# Patient Record
Sex: Female | Born: 1950
Health system: Southern US, Community
[De-identification: ages and names within clinical notes are randomized; demographics above are authoritative.]

## PROBLEM LIST (undated history)

## (undated) DIAGNOSIS — I10 Essential (primary) hypertension: Secondary | ICD-10-CM

## (undated) DIAGNOSIS — M1990 Unspecified osteoarthritis, unspecified site: Secondary | ICD-10-CM

## (undated) DIAGNOSIS — E785 Hyperlipidemia, unspecified: Secondary | ICD-10-CM

## (undated) DIAGNOSIS — K219 Gastro-esophageal reflux disease without esophagitis: Secondary | ICD-10-CM

## (undated) HISTORY — PX: POLYPECTOMY: SHX149

## (undated) HISTORY — DX: Gastro-esophageal reflux disease without esophagitis: K21.9

## (undated) HISTORY — PX: NEUROPLASTY / TRANSPOSITION MEDIAN NERVE AT CARPAL TUNNEL BILATERAL: SUR894

## (undated) HISTORY — DX: Essential (primary) hypertension: I10

## (undated) HISTORY — DX: Hyperlipidemia, unspecified: E78.5

## (undated) HISTORY — DX: Unspecified osteoarthritis, unspecified site: M19.90

---

## 2000-10-16 ENCOUNTER — Emergency Department (HOSPITAL_COMMUNITY): Admission: EM | Admit: 2000-10-16 | Discharge: 2000-10-16 | Payer: Self-pay | Admitting: Emergency Medicine

## 2000-10-16 ENCOUNTER — Encounter: Payer: Self-pay | Admitting: Emergency Medicine

## 2001-02-23 ENCOUNTER — Other Ambulatory Visit: Admission: RE | Admit: 2001-02-23 | Discharge: 2001-02-23 | Payer: Self-pay | Admitting: *Deleted

## 2001-02-27 ENCOUNTER — Encounter: Admission: RE | Admit: 2001-02-27 | Discharge: 2001-02-27 | Payer: Self-pay | Admitting: Obstetrics and Gynecology

## 2001-02-27 ENCOUNTER — Encounter: Payer: Self-pay | Admitting: Obstetrics and Gynecology

## 2002-02-06 ENCOUNTER — Encounter: Payer: Self-pay | Admitting: Physical Medicine and Rehabilitation

## 2002-02-06 ENCOUNTER — Ambulatory Visit (HOSPITAL_COMMUNITY)
Admission: RE | Admit: 2002-02-06 | Discharge: 2002-02-06 | Payer: Self-pay | Admitting: Physical Medicine and Rehabilitation

## 2002-12-13 ENCOUNTER — Emergency Department (HOSPITAL_COMMUNITY): Admission: EM | Admit: 2002-12-13 | Discharge: 2002-12-13 | Payer: Self-pay | Admitting: Emergency Medicine

## 2003-01-24 ENCOUNTER — Emergency Department (HOSPITAL_COMMUNITY): Admission: EM | Admit: 2003-01-24 | Discharge: 2003-01-24 | Payer: Self-pay | Admitting: *Deleted

## 2003-01-24 ENCOUNTER — Encounter: Payer: Self-pay | Admitting: Emergency Medicine

## 2003-01-29 ENCOUNTER — Encounter: Payer: Self-pay | Admitting: Internal Medicine

## 2003-01-29 ENCOUNTER — Ambulatory Visit (HOSPITAL_COMMUNITY): Admission: RE | Admit: 2003-01-29 | Discharge: 2003-01-29 | Payer: Self-pay | Admitting: Internal Medicine

## 2003-06-23 ENCOUNTER — Encounter: Admission: RE | Admit: 2003-06-23 | Discharge: 2003-06-23 | Payer: Self-pay | Admitting: General Practice

## 2004-03-02 ENCOUNTER — Other Ambulatory Visit: Admission: RE | Admit: 2004-03-02 | Discharge: 2004-03-02 | Payer: Self-pay | Admitting: Obstetrics and Gynecology

## 2004-03-20 ENCOUNTER — Emergency Department (HOSPITAL_COMMUNITY): Admission: EM | Admit: 2004-03-20 | Discharge: 2004-03-20 | Payer: Self-pay | Admitting: Emergency Medicine

## 2004-07-05 ENCOUNTER — Encounter: Admission: RE | Admit: 2004-07-05 | Discharge: 2004-07-05 | Payer: Self-pay | Admitting: Obstetrics and Gynecology

## 2004-08-25 ENCOUNTER — Ambulatory Visit: Payer: Self-pay | Admitting: Family Medicine

## 2007-03-19 DIAGNOSIS — K219 Gastro-esophageal reflux disease without esophagitis: Secondary | ICD-10-CM | POA: Insufficient documentation

## 2008-06-30 ENCOUNTER — Telehealth: Payer: Self-pay | Admitting: Physician Assistant

## 2009-04-07 ENCOUNTER — Ambulatory Visit: Payer: Self-pay | Admitting: Family Medicine

## 2009-04-07 DIAGNOSIS — M545 Low back pain: Secondary | ICD-10-CM

## 2009-04-07 DIAGNOSIS — M25559 Pain in unspecified hip: Secondary | ICD-10-CM

## 2009-04-08 ENCOUNTER — Ambulatory Visit: Payer: Self-pay | Admitting: Family Medicine

## 2009-07-31 ENCOUNTER — Encounter: Payer: Self-pay | Admitting: Family Medicine

## 2010-01-03 ENCOUNTER — Emergency Department (HOSPITAL_COMMUNITY): Admission: EM | Admit: 2010-01-03 | Discharge: 2010-01-04 | Payer: Self-pay | Admitting: Emergency Medicine

## 2010-06-01 ENCOUNTER — Emergency Department (HOSPITAL_COMMUNITY)
Admission: EM | Admit: 2010-06-01 | Discharge: 2010-06-01 | Payer: Self-pay | Source: Home / Self Care | Admitting: Family Medicine

## 2010-07-08 NOTE — Letter (Signed)
Summary: [facility]  [facility]   Imported By: Maryln Gottron 08/10/2009 14:37:30  _____________________________________________________________________  External Attachment:    Type:   Image     Comment:   External Document

## 2010-08-04 ENCOUNTER — Encounter (INDEPENDENT_AMBULATORY_CARE_PROVIDER_SITE_OTHER): Payer: Self-pay | Admitting: *Deleted

## 2010-08-12 NOTE — Letter (Signed)
Summary: Pre Visit Letter Revised  [facility]  [address] [place], [place] [address]   Phone: [phone]  Fax: [phone]        08/04/2010 MRN: [id number] Christy Kirby [address] [place], [place]  [address]             Procedure Date:  September 07, 2010   dir col -Dr Marina Goodell   Welcome to the Gastroenterology Division at [facility].    You are scheduled to see a nurse for your pre-procedure visit on August 24, 2010 at 9:30am on the 3rd floor at [facility], [address]  We ask that you try to arrive at our office 15 minutes prior to your appointment time to allow for check-in.  Please take a minute to review the attached form.  If you answer "Yes" to one or more of the questions on the first page, we ask that you call the person listed at your earliest opportunity.  If you answer "No" to all of the questions, please complete the rest of the form and bring it to your appointment.    Your nurse visit will consist of discussing your medical and surgical history, your immediate family medical history, and your medications.   If you are unable to list all of your medications on the form, please bring the medication bottles to your appointment and we will list them.  We will need to be aware of both prescribed and over the counter drugs.  We will need to know exact dosage information as well.    Please be prepared to read and sign documents such as consent forms, a financial agreement, and acknowledgement forms.  If necessary, and with your consent, a friend or relative is welcome to sit-in on the nurse visit with you.  Please bring your insurance card so that we may make a copy of it.  If your insurance requires a referral to see a specialist, please bring your referral form from your primary care physician.  No co-pay is required for this nurse visit.     If you cannot keep your appointment, please call [phone] to cancel or reschedule prior to your  appointment date.  This allows [place] the opportunity to schedule an appointment for another patient in need of care.    Thank you for choosing [facility] for your medical needs.  We appreciate the opportunity to care for you.  Please visit [place] at our website  to learn more about our practice.  Sincerely, The Gastroenterology Division

## 2010-08-24 ENCOUNTER — Ambulatory Visit (INDEPENDENT_AMBULATORY_CARE_PROVIDER_SITE_OTHER): Payer: BC Managed Care – PPO | Admitting: *Deleted

## 2010-08-24 VITALS — Ht 65.5 in | Wt 153.2 lb

## 2010-08-24 DIAGNOSIS — Z1211 Encounter for screening for malignant neoplasm of colon: Secondary | ICD-10-CM

## 2010-08-24 MED ORDER — PEG-KCL-NACL-NASULF-NA ASC-C 100 G PO SOLR: 1.0000 | Freq: Once | ORAL | Status: DC

## 2010-08-24 MED ORDER — PEG-KCL-NACL-NASULF-NA ASC-C 100 G PO SOLR: 1.0000 | Freq: Once | ORAL | Status: AC

## 2010-08-24 NOTE — Patient Instructions (Signed)
Pick up MoviPrep within 5 days of today

## 2010-08-25 ENCOUNTER — Encounter: Payer: Self-pay | Admitting: Internal Medicine

## 2010-08-26 ENCOUNTER — Ambulatory Visit (INDEPENDENT_AMBULATORY_CARE_PROVIDER_SITE_OTHER): Payer: BC Managed Care – PPO | Admitting: Family Medicine

## 2010-08-26 ENCOUNTER — Encounter: Payer: Self-pay | Admitting: Family Medicine

## 2010-08-26 VITALS — BP 116/78 | Temp 98.5°F | Wt 154.0 lb

## 2010-08-26 DIAGNOSIS — M1990 Unspecified osteoarthritis, unspecified site: Secondary | ICD-10-CM

## 2010-08-26 DIAGNOSIS — E785 Hyperlipidemia, unspecified: Secondary | ICD-10-CM

## 2010-08-26 DIAGNOSIS — R5381 Other malaise: Secondary | ICD-10-CM

## 2010-08-26 DIAGNOSIS — M129 Arthropathy, unspecified: Secondary | ICD-10-CM

## 2010-08-26 DIAGNOSIS — R531 Weakness: Secondary | ICD-10-CM

## 2010-08-26 LAB — CBC WITH DIFFERENTIAL/PLATELET
Basophils Absolute: 0 10*3/uL (ref 0.0–0.1)
Hemoglobin: 12.4 g/dL (ref 12.0–15.0)
Lymphocytes Relative: 32 % (ref 12.0–46.0)
Monocytes Relative: 7.5 % (ref 3.0–12.0)
Neutro Abs: 2.3 10*3/uL (ref 1.4–7.7)
RBC: 4.04 Mil/uL (ref 3.87–5.11)
RDW: 13.8 % (ref 11.5–14.6)

## 2010-08-26 LAB — POCT URINALYSIS DIPSTICK
Bilirubin, UA: NEGATIVE
Ketones, UA: NEGATIVE
Leukocytes, UA: NEGATIVE
Protein, UA: NEGATIVE

## 2010-08-26 LAB — BASIC METABOLIC PANEL
BUN: 17 mg/dL (ref 6–23)
Calcium: 9.4 mg/dL (ref 8.4–10.5)
Creatinine, Ser: 1 mg/dL (ref 0.4–1.2)
GFR: 71.38 mL/min (ref >60.00)

## 2010-08-26 LAB — LIPID PANEL
Cholesterol: 238 mg/dL — ABNORMAL HIGH (ref 0–200)
HDL: 53.8 mg/dL (ref >39.00)
VLDL: 25.2 mg/dL (ref 0.0–40.0)

## 2010-08-26 LAB — TSH: TSH: 1.04 u[IU]/mL (ref 0.35–5.50)

## 2010-08-26 LAB — LDL CHOLESTEROL, DIRECT: Direct LDL: 173.5 mg/dL

## 2010-08-26 LAB — HEPATIC FUNCTION PANEL: Total Bilirubin: 0.5 mg/dL (ref 0.3–1.2)

## 2010-08-26 NOTE — Progress Notes (Signed)
  Subjective:    Patient ID: Christy Kirby, female    DOB: January 23, 1951, 60 y.o.   MRN: [id number]  HPI Here to follow up on complaints of mild generalized fatigue. I last saw her a year and a half ago for hip and lower back pains. She then saw Dr. Renae Fickle, who diagnosed her with degenerative arthritis. She stopped working and is now at home. She admits to not getting any exercise. She recently had a GYN exam with Dr. Rana Snare, and he did some labs suggesting she may be anemic. He sent her to see [place]. She is fasting today.    Review of Systems  Constitutional: Positive for fatigue. Negative for appetite change and unexpected weight change.  Respiratory: Negative.   Cardiovascular: Negative.   Musculoskeletal: Positive for back pain and arthralgias.       Objective:   Physical Exam  Constitutional: She appears well-developed and well-nourished.  Neck: No thyromegaly present.  Cardiovascular: Normal rate, normal heart sounds and intact distal pulses.   Pulmonary/Chest: Effort normal and breath sounds normal.  Lymphadenopathy:    She has no cervical adenopathy.          Assessment & Plan:  I suggested she get 30 minutes of walking in every day. Get labs today

## 2010-08-30 ENCOUNTER — Telehealth: Payer: Self-pay

## 2010-08-30 NOTE — Telephone Encounter (Signed)
Pt aware and will call back 6 months  For reck

## 2010-08-30 NOTE — Telephone Encounter (Signed)
Message copied by Madison Hickman on Mon Aug 30, 2010 11:51 AM ------      Message from: Dwaine Deter      Created: Mon Aug 30, 2010  8:46 AM       Normal except very high chol. Watch a strict low fat diet and recheck in 6 months

## 2010-09-06 ENCOUNTER — Encounter: Payer: Self-pay | Admitting: Internal Medicine

## 2010-09-07 ENCOUNTER — Ambulatory Visit (AMBULATORY_SURGERY_CENTER): Payer: BC Managed Care – PPO | Admitting: Internal Medicine

## 2010-09-07 ENCOUNTER — Encounter: Payer: Self-pay | Admitting: Internal Medicine

## 2010-09-07 VITALS — BP 151/96 | HR 75 | Temp 96.0°F | Resp 16 | Ht 65.5 in | Wt 150.0 lb

## 2010-09-07 DIAGNOSIS — D126 Benign neoplasm of colon, unspecified: Secondary | ICD-10-CM

## 2010-09-07 DIAGNOSIS — K648 Other hemorrhoids: Secondary | ICD-10-CM

## 2010-09-07 DIAGNOSIS — Z1211 Encounter for screening for malignant neoplasm of colon: Secondary | ICD-10-CM

## 2010-09-07 NOTE — Patient Instructions (Signed)
Read handout regarding polyps.  We will send you a letter in a couple of weeks to tell you when to return for another colonoscopy.  Continue your current medicines.   Thanks.  Call if you have any questions at [phone]

## 2010-09-08 ENCOUNTER — Telehealth: Payer: Self-pay | Admitting: *Deleted

## 2010-09-08 NOTE — Telephone Encounter (Signed)

## 2013-08-13 ENCOUNTER — Other Ambulatory Visit (INDEPENDENT_AMBULATORY_CARE_PROVIDER_SITE_OTHER): Payer: Commercial Managed Care - HMO

## 2013-08-13 DIAGNOSIS — Z0000 Encounter for general adult medical examination without abnormal findings: Secondary | ICD-10-CM

## 2013-08-13 LAB — CBC WITH DIFFERENTIAL/PLATELET
BASOS ABS: 0 10*3/uL (ref 0.0–0.1)
Basophils Relative: 0.8 % (ref 0.0–3.0)
EOS ABS: 0.1 10*3/uL (ref 0.0–0.7)
Eosinophils Relative: 2.1 % (ref 0.0–5.0)
HCT: 38.4 % (ref 36.0–46.0)
Hemoglobin: 12.8 g/dL (ref 12.0–15.0)
LYMPHS PCT: 41.8 % (ref 12.0–46.0)
Lymphs Abs: 1.8 10*3/uL (ref 0.7–4.0)
MCHC: 33.4 g/dL (ref 30.0–36.0)
MCV: 90.2 fl (ref 78.0–100.0)
MONOS PCT: 6.6 % (ref 3.0–12.0)
Monocytes Absolute: 0.3 10*3/uL (ref 0.1–1.0)
NEUTROS PCT: 48.7 % (ref 43.0–77.0)
Neutro Abs: 2.1 10*3/uL (ref 1.4–7.7)
PLATELETS: 262 10*3/uL (ref 150.0–400.0)
RBC: 4.26 Mil/uL (ref 3.87–5.11)
RDW: 13.4 % (ref 11.5–14.6)
WBC: 4.3 10*3/uL — ABNORMAL LOW (ref 4.5–10.5)

## 2013-08-13 LAB — BASIC METABOLIC PANEL
BUN: 17 mg/dL (ref 6–23)
CALCIUM: 9.8 mg/dL (ref 8.4–10.5)
CO2: 32 meq/L (ref 19–32)
CREATININE: 0.9 mg/dL (ref 0.4–1.2)
Chloride: 106 mEq/L (ref 96–112)
GFR: 86.28 mL/min (ref >60.00)
GLUCOSE: 80 mg/dL (ref 70–99)
Potassium: 4.5 mEq/L (ref 3.5–5.1)
Sodium: 142 mEq/L (ref 135–145)

## 2013-08-13 LAB — POCT URINALYSIS DIPSTICK
BILIRUBIN UA: NEGATIVE
Blood, UA: NEGATIVE
GLUCOSE UA: NEGATIVE
KETONES UA: NEGATIVE
Nitrite, UA: NEGATIVE
Protein, UA: NEGATIVE
SPEC GRAV UA: 1.02
UROBILINOGEN UA: 0.2
pH, UA: 5.5

## 2013-08-13 LAB — HEPATIC FUNCTION PANEL
ALBUMIN: 4 g/dL (ref 3.5–5.2)
ALK PHOS: 50 U/L (ref 39–117)
ALT: 17 U/L (ref 0–35)
AST: 20 U/L (ref 0–37)
BILIRUBIN DIRECT: 0.1 mg/dL (ref 0.0–0.3)
BILIRUBIN TOTAL: 0.6 mg/dL (ref 0.3–1.2)
Total Protein: 7.6 g/dL (ref 6.0–8.3)

## 2013-08-13 LAB — LIPID PANEL
CHOLESTEROL: 239 mg/dL — AB (ref 0–200)
HDL: 54.9 mg/dL (ref >39.00)
LDL CALC: 161 mg/dL — AB (ref 0–99)
TRIGLYCERIDES: 116 mg/dL (ref 0.0–149.0)
Total CHOL/HDL Ratio: 4
VLDL: 23.2 mg/dL (ref 0.0–40.0)

## 2013-08-13 LAB — TSH: TSH: 1.04 u[IU]/mL (ref 0.35–5.50)

## 2013-08-19 ENCOUNTER — Encounter: Payer: Self-pay | Admitting: Family Medicine

## 2013-08-19 ENCOUNTER — Ambulatory Visit (INDEPENDENT_AMBULATORY_CARE_PROVIDER_SITE_OTHER): Payer: Commercial Managed Care - HMO | Admitting: Family Medicine

## 2013-08-19 VITALS — BP 140/90 | HR 98 | Temp 98.1°F | Ht 64.75 in | Wt 152.0 lb

## 2013-08-19 DIAGNOSIS — Z0000 Encounter for general adult medical examination without abnormal findings: Secondary | ICD-10-CM

## 2013-08-19 MED ORDER — IBUPROFEN 800 MG PO TABS: 800.0000 mg | ORAL_TABLET | Freq: Three times a day (TID) | ORAL | Status: DC | PRN

## 2013-08-19 NOTE — Progress Notes (Signed)
   Subjective:    Patient ID: Pecola Lawless [name]), female    DOB: 23-Feb-1951, 63 y.o.   MRN: [id number]  HPI 63 yr old female for a cpx. She feels well.    Review of Systems  Constitutional: Negative.   HENT: Negative.   Eyes: Negative.   Respiratory: Negative.   Cardiovascular: Negative.   Gastrointestinal: Negative.   Genitourinary: Negative for dysuria, urgency, frequency, hematuria, flank pain, decreased urine volume, enuresis, difficulty urinating, pelvic pain and dyspareunia.  Musculoskeletal: Negative.   Skin: Negative.   Neurological: Negative.   Psychiatric/Behavioral: Negative.        Objective:   Physical Exam  Constitutional: She is oriented to person, place, and time. She appears well-developed and well-nourished. No distress.  HENT:  Head: Normocephalic and atraumatic.  Right Ear: External ear normal.  Left Ear: External ear normal.  Nose: Nose normal.  Mouth/Throat: Oropharynx is clear and moist. No oropharyngeal exudate.  Eyes: Conjunctivae and EOM are normal. Pupils are equal, round, and reactive to light. No scleral icterus.  Neck: Normal range of motion. Neck supple. No JVD present. No thyromegaly present.  Cardiovascular: Normal rate, regular rhythm, normal heart sounds and intact distal pulses.  Exam reveals no gallop and no friction rub.   No murmur heard. Pulmonary/Chest: Effort normal and breath sounds normal. No respiratory distress. She has no wheezes. She has no rales. She exhibits no tenderness.  Abdominal: Soft. Bowel sounds are normal. She exhibits no distension and no mass. There is no tenderness. There is no rebound and no guarding.  Musculoskeletal: Normal range of motion. She exhibits no edema and no tenderness.  Lymphadenopathy:    She has no cervical adenopathy.  Neurological: She is alert and oriented to person, place, and time. She has normal reflexes. No cranial nerve deficit. She exhibits normal muscle tone. Coordination normal.    Skin: Skin is warm and dry. No rash noted. No erythema.  Psychiatric: She has a normal mood and affect. Her behavior is normal. Judgment and thought content normal.          Assessment & Plan:  Well exam. She will watch her intake of sodium and fatty foods.

## 2013-08-19 NOTE — Progress Notes (Signed)
Pre visit review using our clinic review tool, if applicable. No additional management support is needed unless otherwise documented below in the visit note. 

## 2014-01-08 ENCOUNTER — Telehealth: Payer: Self-pay | Admitting: Family Medicine

## 2014-01-08 NOTE — Telephone Encounter (Signed)
Per Dr. Sarajane Jews, for GERD pt can try over the counter Prilosec 20 mg take 1 po qd or Zantac twice a day.

## 2014-01-08 NOTE — Telephone Encounter (Signed)
The only other thing to take for pain OTC that would not affect her GERD would be Tylenol

## 2014-01-08 NOTE — Telephone Encounter (Addendum)
Pt was in march 2015 and md wrote a name of ?otc med she canl take with IBUPROFEN . Pt can not remember the name of medication. Pt stated [facility]. Pt would like someone to call pharm and get name and call rx into pharm

## 2014-01-08 NOTE — Telephone Encounter (Signed)
I tried to reach pt by phone and no answer. Do you remember if pt discussed GERD with you on her last office visit in March 2015? I called pharmacy and they did not have any other medication listed other than the Ibuprofen.

## 2014-01-09 MED ORDER — OMEPRAZOLE 20 MG PO CPDR: 20.0000 mg | DELAYED_RELEASE_CAPSULE | Freq: Every day | ORAL | Status: DC

## 2014-01-09 NOTE — Telephone Encounter (Signed)
I spoke with pt and she would like the [facility] and I did send script e-scribe.

## 2014-09-15 ENCOUNTER — Telehealth: Payer: Self-pay

## 2014-09-15 NOTE — Telephone Encounter (Signed)
[facility]uprofen (ADVIL,MOTRIN) 800 MG tablet. Last filled 07/18/2014

## 2014-09-17 MED ORDER — IBUPROFEN 800 MG PO TABS: 800.0000 mg | ORAL_TABLET | Freq: Three times a day (TID) | ORAL | Status: DC | PRN

## 2014-09-17 NOTE — Telephone Encounter (Signed)
Per Dr. Fry okay to refill and I did send script e-scribe.  

## 2014-09-17 NOTE — Addendum Note (Signed)
Addended by: Aggie Hacker A on: 09/17/2014 01:09 PM   Modules accepted: Orders

## 2014-11-14 ENCOUNTER — Ambulatory Visit (INDEPENDENT_AMBULATORY_CARE_PROVIDER_SITE_OTHER): Payer: Commercial Managed Care - HMO | Admitting: Family Medicine

## 2014-11-14 ENCOUNTER — Encounter: Payer: Self-pay | Admitting: Family Medicine

## 2014-11-14 VITALS — BP 130/80 | HR 80 | Temp 98.1°F | Resp 20 | Ht 64.75 in | Wt 149.0 lb

## 2014-11-14 DIAGNOSIS — J351 Hypertrophy of tonsils: Secondary | ICD-10-CM

## 2014-11-14 MED ORDER — METHYLPREDNISOLONE 4 MG PO TBPK: ORAL_TABLET | ORAL | Status: DC

## 2014-11-14 NOTE — Progress Notes (Signed)
   Subjective:    Patient ID: Christy Kirby), female    DOB: 1951-02-04, 64 y.o.   MRN: [id number]  HPI Here for 3 weeks of mild irritation in the throat and a sensation of swelling in the right side of the throat. No ST or pain. She has some sinus congestion and PND. No fever or cough. Drinking fluids. Claritin does not help.    Review of Systems  Constitutional: Negative.   HENT: Positive for congestion and postnasal drip. Negative for ear pain, nosebleeds, rhinorrhea, sinus pressure, sore throat and voice change.   Eyes: Negative.   Respiratory: Negative.        Objective:   Physical Exam  Constitutional: She appears well-developed and well-nourished.  HENT:  Right Ear: External ear normal.  Left Ear: External ear normal.  Nose: Nose normal.  The posterior OP is clear with no erythema or exudate. She has a little more tonsillar tissue on the right than on the left  Eyes: Conjunctivae are normal.  Neck: No thyromegaly present.  Pulmonary/Chest: Effort normal and breath sounds normal.  Lymphadenopathy:    She has no cervical adenopathy.          Assessment & Plan:  Tonsillar hypertrophy, possibly allergic. Treat with a Medrol dose pack. Recheck prn

## 2014-11-14 NOTE — Progress Notes (Signed)
Pre visit review using our clinic review tool, if applicable. No additional management support is needed unless otherwise documented below in the visit note. 

## 2014-11-28 ENCOUNTER — Other Ambulatory Visit (INDEPENDENT_AMBULATORY_CARE_PROVIDER_SITE_OTHER): Payer: Commercial Managed Care - HMO

## 2014-11-28 DIAGNOSIS — Z0000 Encounter for general adult medical examination without abnormal findings: Secondary | ICD-10-CM

## 2014-11-28 LAB — TSH: TSH: 0.96 u[IU]/mL (ref 0.35–4.50)

## 2014-11-28 LAB — POCT URINALYSIS DIPSTICK
Bilirubin, UA: NEGATIVE
Glucose, UA: NEGATIVE
Ketones, UA: NEGATIVE
Nitrite, UA: NEGATIVE
Protein, UA: NEGATIVE
Spec Grav, UA: 1.025
Urobilinogen, UA: 0.2
pH, UA: 5.5

## 2014-11-28 LAB — CBC WITH DIFFERENTIAL/PLATELET
Basophils Absolute: 0 10*3/uL (ref 0.0–0.1)
Basophils Relative: 0.6 % (ref 0.0–3.0)
Eosinophils Absolute: 0.1 10*3/uL (ref 0.0–0.7)
Eosinophils Relative: 1.3 % (ref 0.0–5.0)
HCT: 35.3 % — ABNORMAL LOW (ref 36.0–46.0)
Hemoglobin: 11.9 g/dL — ABNORMAL LOW (ref 12.0–15.0)
Lymphocytes Relative: 29 % (ref 12.0–46.0)
Lymphs Abs: 1.5 10*3/uL (ref 0.7–4.0)
MCHC: 33.6 g/dL (ref 30.0–36.0)
MCV: 89.4 fl (ref 78.0–100.0)
Monocytes Absolute: 0.4 10*3/uL (ref 0.1–1.0)
Monocytes Relative: 7.4 % (ref 3.0–12.0)
Neutro Abs: 3.2 10*3/uL (ref 1.4–7.7)
Neutrophils Relative %: 61.7 % (ref 43.0–77.0)
Platelets: 254 10*3/uL (ref 150.0–400.0)
RBC: 3.95 Mil/uL (ref 3.87–5.11)
RDW: 13.5 % (ref 11.5–15.5)
WBC: 5.2 10*3/uL (ref 4.0–10.5)

## 2014-11-28 LAB — BASIC METABOLIC PANEL
BUN: 13 mg/dL (ref 6–23)
CALCIUM: 9.4 mg/dL (ref 8.4–10.5)
CHLORIDE: 105 meq/L (ref 96–112)
CO2: 29 meq/L (ref 19–32)
CREATININE: 0.8 mg/dL (ref 0.40–1.20)
GFR: 92.16 mL/min (ref >60.00)
Glucose, Bld: 86 mg/dL (ref 70–99)
Potassium: 4 mEq/L (ref 3.5–5.1)
SODIUM: 139 meq/L (ref 135–145)

## 2014-11-28 LAB — LIPID PANEL
CHOLESTEROL: 219 mg/dL — AB (ref 0–200)
HDL: 46.3 mg/dL (ref >39.00)
LDL CALC: 155 mg/dL — AB (ref 0–99)
NonHDL: 172.7
TRIGLYCERIDES: 89 mg/dL (ref 0.0–149.0)
Total CHOL/HDL Ratio: 5
VLDL: 17.8 mg/dL (ref 0.0–40.0)

## 2014-11-28 LAB — HEPATIC FUNCTION PANEL
ALT: 13 U/L (ref 0–35)
AST: 17 U/L (ref 0–37)
Albumin: 3.7 g/dL (ref 3.5–5.2)
Alkaline Phosphatase: 48 U/L (ref 39–117)
BILIRUBIN DIRECT: 0.1 mg/dL (ref 0.0–0.3)
BILIRUBIN TOTAL: 0.4 mg/dL (ref 0.2–1.2)
Total Protein: 7.3 g/dL (ref 6.0–8.3)

## 2014-12-02 MED ORDER — CIPROFLOXACIN HCL 500 MG PO TABS: 500.0000 mg | ORAL_TABLET | Freq: Two times a day (BID) | ORAL | Status: DC

## 2014-12-02 NOTE — Addendum Note (Signed)
Addended by: Aggie Hacker A on: 12/02/2014 03:53 PM   Modules accepted: Orders

## 2014-12-03 ENCOUNTER — Ambulatory Visit (INDEPENDENT_AMBULATORY_CARE_PROVIDER_SITE_OTHER): Payer: Commercial Managed Care - HMO | Admitting: Family Medicine

## 2014-12-03 ENCOUNTER — Encounter: Payer: Self-pay | Admitting: Family Medicine

## 2014-12-03 VITALS — BP 138/88 | HR 72 | Temp 98.2°F | Ht 64.75 in | Wt 154.0 lb

## 2014-12-03 DIAGNOSIS — M25551 Pain in right hip: Secondary | ICD-10-CM | POA: Diagnosis not present

## 2014-12-03 DIAGNOSIS — Z0000 Encounter for general adult medical examination without abnormal findings: Secondary | ICD-10-CM

## 2014-12-03 NOTE — Progress Notes (Signed)
Pre visit review using our clinic review tool, if applicable. No additional management support is needed unless otherwise documented below in the visit note. 

## 2014-12-03 NOTE — Progress Notes (Signed)
   Subjective:    Patient ID: Christy Kirby Christy Kirby), female    DOB: 05/31/1951, 64 y.o.   MRN: [id number]  HPI 64 yr old female for a cpx. Se feels well in general but does complain of pain in the right hip. This has bothered er off and on for years but is worse lately. She takes Ibuprofen for this. She was here recently for swollen tonsils and she took a steroid dose pack for this. This was successful and her throat feels better.    Review of Systems  Constitutional: Negative.   HENT: Negative.   Eyes: Negative.   Respiratory: Negative.   Cardiovascular: Negative.   Gastrointestinal: Negative.   Genitourinary: Negative for dysuria, urgency, frequency, hematuria, flank pain, decreased urine volume, enuresis, difficulty urinating, pelvic pain and dyspareunia.  Musculoskeletal: Negative.   Skin: Negative.   Neurological: Negative.   Psychiatric/Behavioral: Negative.        Objective:   Physical Exam  Constitutional: She is oriented to person, place, and time. She appears well-developed and well-nourished. No distress.  HENT:  Head: Normocephalic and atraumatic.  Right Ear: External ear normal.  Left Ear: External ear normal.  Nose: Nose normal.  Mouth/Throat: Oropharynx is clear and moist. No oropharyngeal exudate.  Eyes: Conjunctivae and EOM are normal. Pupils are equal, round, and reactive to light. No scleral icterus.  Neck: Normal range of motion. Neck supple. No JVD present. No thyromegaly present.  Cardiovascular: Normal rate, regular rhythm, normal heart sounds and intact distal pulses.  Exam reveals no gallop and no friction rub.   No murmur heard. Pulmonary/Chest: Effort normal and breath sounds normal. No respiratory distress. She has no wheezes. She has no rales. She exhibits no tenderness.  Abdominal: Soft. Bowel sounds are normal. She exhibits no distension and no mass. There is no tenderness. There is no rebound and no guarding.  Musculoskeletal: Normal range of  motion. She exhibits no edema or tenderness.  Lymphadenopathy:    She has no cervical adenopathy.  Neurological: She is alert and oriented to person, place, and time. She has normal reflexes. No cranial nerve deficit. She exhibits normal muscle tone. Coordination normal.  Skin: Skin is warm and dry. No rash noted. No erythema.  Psychiatric: She has a normal mood and affect. Her behavior is normal. Judgment and thought content normal.          Assessment & Plan:  Well exam. We will refer her to Orthopedics for the hip pain. She will work on diet and exercise to get the cholesterol down.

## 2014-12-22 DIAGNOSIS — M5441 Lumbago with sciatica, right side: Secondary | ICD-10-CM | POA: Diagnosis not present

## 2014-12-22 DIAGNOSIS — M7061 Trochanteric bursitis, right hip: Secondary | ICD-10-CM | POA: Diagnosis not present

## 2015-01-21 DIAGNOSIS — Z01419 Encounter for gynecological examination (general) (routine) without abnormal findings: Secondary | ICD-10-CM | POA: Diagnosis not present

## 2015-01-21 DIAGNOSIS — Z6826 Body mass index (BMI) 26.0-26.9, adult: Secondary | ICD-10-CM | POA: Diagnosis not present

## 2015-01-21 DIAGNOSIS — Z1231 Encounter for screening mammogram for malignant neoplasm of breast: Secondary | ICD-10-CM | POA: Diagnosis not present

## 2015-08-21 ENCOUNTER — Encounter: Payer: Self-pay | Admitting: Internal Medicine

## 2015-10-18 ENCOUNTER — Other Ambulatory Visit: Payer: Self-pay | Admitting: Family Medicine

## 2016-01-04 ENCOUNTER — Other Ambulatory Visit: Payer: Self-pay | Admitting: Family Medicine

## 2016-01-04 ENCOUNTER — Ambulatory Visit (INDEPENDENT_AMBULATORY_CARE_PROVIDER_SITE_OTHER): Payer: Commercial Managed Care - HMO | Admitting: Family Medicine

## 2016-01-04 ENCOUNTER — Encounter: Payer: Self-pay | Admitting: Family Medicine

## 2016-01-04 VITALS — BP 147/82 | HR 117 | Temp 102.2°F | Ht 64.75 in | Wt 168.0 lb

## 2016-01-04 DIAGNOSIS — R509 Fever, unspecified: Secondary | ICD-10-CM

## 2016-01-04 LAB — POC URINALSYSI DIPSTICK (AUTOMATED)
Glucose, UA: NEGATIVE
Nitrite, UA: POSITIVE
RBC UA: NEGATIVE
Urobilinogen, UA: >=8
pH, UA: 5.5

## 2016-01-04 MED ORDER — IBUPROFEN 800 MG PO TABS: 800.0000 mg | ORAL_TABLET | Freq: Four times a day (QID) | ORAL | 5 refills | Status: DC | PRN

## 2016-01-04 MED ORDER — DOXYCYCLINE HYCLATE 100 MG PO CAPS: 100.0000 mg | ORAL_CAPSULE | Freq: Two times a day (BID) | ORAL | 0 refills | Status: AC

## 2016-01-04 NOTE — Progress Notes (Signed)
Pre visit review using our clinic review tool, if applicable. No additional management support is needed unless otherwise documented below in the visit note. 

## 2016-01-04 NOTE — Progress Notes (Signed)
   Subjective:    Patient ID: Pecola Lawless [name]), female    DOB: Mar 09, 1951, 65 y.o.   MRN: [id number]  HPI Here for 2 days of fever, chills, aching in the muscles all over her body, a generalized headache, and nausea without vomiting. No ST or cough or SOB. Last night she also developed an itching rash all over the trunk. No recent tick bites. She recently flew home from spending a week in [place].    Review of Systems  Constitutional: Positive for chills, diaphoresis and fever.  Eyes: Negative.   Respiratory: Negative.   Cardiovascular: Negative.   Gastrointestinal: Positive for nausea. Negative for abdominal distention, abdominal pain, anal bleeding, blood in stool, constipation, diarrhea, rectal pain and vomiting.  Genitourinary: Negative.   Musculoskeletal: Positive for myalgias. Negative for arthralgias, joint swelling, neck pain and neck stiffness.  Skin: Positive for rash.  Neurological: Positive for headaches. Negative for dizziness, tremors, seizures, syncope, facial asymmetry, speech difficulty, weakness, light-headedness and numbness.  Hematological: Negative.  Negative for adenopathy.       Objective:   Physical Exam  Constitutional: She is oriented to person, place, and time.  Alert but she appears ill   HENT:  Right Ear: External ear normal.  Left Ear: External ear normal.  Nose: Nose normal.  Mouth/Throat: Oropharynx is clear and moist.  Eyes: Conjunctivae are normal. Pupils are equal, round, and reactive to light.  Neck: Neck supple. No thyromegaly present.  Cardiovascular: Normal rate, regular rhythm, normal heart sounds and intact distal pulses.   No murmur heard. Pulmonary/Chest: Effort normal and breath sounds normal. No respiratory distress. She has no wheezes. She has no rales.  Abdominal: Soft. Bowel sounds are normal. She exhibits no distension and no mass. There is no tenderness. There is no rebound and no guarding.  Musculoskeletal: Normal range  of motion. She exhibits no edema or tenderness.  Lymphadenopathy:    She has no cervical adenopathy.  Neurological: She is alert and oriented to person, place, and time. She has normal reflexes.  Skin:  Widespread urticarial rash over the entire trunk           Assessment & Plan:  Fever, headache, myalgia and rash of uncertain etiology. It seems to be viral but we need to consider tick borne illnesses as well. We will start her on Doxycycline and she can take 800 mg Ibuprofen for pain and fever relief. Get labs today. Drink fluids. She will let us know if anything changes.

## 2016-01-05 ENCOUNTER — Encounter: Payer: Self-pay | Admitting: Family Medicine

## 2016-01-05 LAB — CBC WITH DIFFERENTIAL/PLATELET
BASOS PCT: 0.1 % (ref 0.0–3.0)
Basophils Absolute: 0 10*3/uL (ref 0.0–0.1)
EOS ABS: 0 10*3/uL (ref 0.0–0.7)
Eosinophils Relative: 0.4 % (ref 0.0–5.0)
HCT: 38.3 % (ref 36.0–46.0)
HEMOGLOBIN: 13.1 g/dL (ref 12.0–15.0)
LYMPHS ABS: 0.5 10*3/uL — AB (ref 0.7–4.0)
Lymphocytes Relative: 4.7 % — ABNORMAL LOW (ref 12.0–46.0)
MCHC: 34.3 g/dL (ref 30.0–36.0)
MCV: 88.7 fl (ref 78.0–100.0)
MONO ABS: 0.5 10*3/uL (ref 0.1–1.0)
Monocytes Relative: 5.2 % (ref 3.0–12.0)
NEUTROS ABS: 9.1 10*3/uL — AB (ref 1.4–7.7)
Neutrophils Relative %: 89.6 % — ABNORMAL HIGH (ref 43.0–77.0)
PLATELETS: 232 10*3/uL (ref 150.0–400.0)
RBC: 4.32 Mil/uL (ref 3.87–5.11)
RDW: 13.3 % (ref 11.5–15.5)
WBC: 10.1 10*3/uL (ref 4.0–10.5)

## 2016-01-05 LAB — BASIC METABOLIC PANEL
BUN: 17 mg/dL (ref 6–23)
CHLORIDE: 97 meq/L (ref 96–112)
CO2: 25 meq/L (ref 19–32)
Calcium: 9.6 mg/dL (ref 8.4–10.5)
Creatinine, Ser: 1.31 mg/dL — ABNORMAL HIGH (ref 0.40–1.20)
GFR: 51.99 mL/min — ABNORMAL LOW (ref >60.00)
Glucose, Bld: 121 mg/dL — ABNORMAL HIGH (ref 70–99)
Potassium: 3.9 mEq/L (ref 3.5–5.1)
Sodium: 136 mEq/L (ref 135–145)

## 2016-01-05 LAB — HEPATIC FUNCTION PANEL
ALT: 59 U/L — AB (ref 0–35)
AST: 59 U/L — ABNORMAL HIGH (ref 0–37)
Albumin: 4 g/dL (ref 3.5–5.2)
Alkaline Phosphatase: 113 U/L (ref 39–117)
BILIRUBIN DIRECT: 0.8 mg/dL — AB (ref 0.0–0.3)
TOTAL PROTEIN: 8.2 g/dL (ref 6.0–8.3)
Total Bilirubin: 1.6 mg/dL — ABNORMAL HIGH (ref 0.2–1.2)

## 2016-01-05 LAB — EPSTEIN-BARR VIRUS VCA, IGG

## 2016-01-05 LAB — LYME AB/WESTERN BLOT REFLEX: B burgdorferi Ab IgG+IgM: <0.9 Index (ref <0.90)

## 2016-01-05 LAB — EPSTEIN-BARR VIRUS VCA, IGM

## 2016-01-06 ENCOUNTER — Telehealth: Payer: Self-pay | Admitting: Family Medicine

## 2016-01-06 LAB — ROCKY MTN SPOTTED FVR ABS PNL(IGG+IGM)
RMSF IgG: NOT DETECTED
RMSF IgM: NOT DETECTED

## 2016-01-06 NOTE — Telephone Encounter (Signed)
Pt would like blood work results °

## 2016-01-06 NOTE — Telephone Encounter (Signed)
I spoke with pt  

## 2016-01-06 NOTE — Telephone Encounter (Signed)
Pt would like sylvia to return her call

## 2016-01-07 NOTE — Telephone Encounter (Signed)
I spoke with pt  

## 2016-01-08 ENCOUNTER — Encounter: Payer: Self-pay | Admitting: Family Medicine

## 2016-01-08 ENCOUNTER — Ambulatory Visit (INDEPENDENT_AMBULATORY_CARE_PROVIDER_SITE_OTHER): Payer: Commercial Managed Care - HMO | Admitting: Family Medicine

## 2016-01-08 VITALS — BP 129/76 | HR 108 | Temp 98.5°F

## 2016-01-08 DIAGNOSIS — IMO0001 Reserved for inherently not codable concepts without codable children: Secondary | ICD-10-CM

## 2016-01-08 DIAGNOSIS — M791 Myalgia: Secondary | ICD-10-CM | POA: Diagnosis not present

## 2016-01-08 DIAGNOSIS — M609 Myositis, unspecified: Secondary | ICD-10-CM

## 2016-01-08 LAB — CBC WITH DIFFERENTIAL/PLATELET
BASOS ABS: 0 10*3/uL (ref 0.0–0.1)
Basophils Relative: 0.2 % (ref 0.0–3.0)
EOS PCT: 0.8 % (ref 0.0–5.0)
Eosinophils Absolute: 0.1 10*3/uL (ref 0.0–0.7)
HCT: 33.5 % — ABNORMAL LOW (ref 36.0–46.0)
Hemoglobin: 11.4 g/dL — ABNORMAL LOW (ref 12.0–15.0)
LYMPHS ABS: 0.8 10*3/uL (ref 0.7–4.0)
Lymphocytes Relative: 9.3 % — ABNORMAL LOW (ref 12.0–46.0)
MCHC: 33.9 g/dL (ref 30.0–36.0)
MCV: 88.2 fl (ref 78.0–100.0)
MONO ABS: 0.4 10*3/uL (ref 0.1–1.0)
Monocytes Relative: 4.7 % (ref 3.0–12.0)
NEUTROS PCT: 85 % — AB (ref 43.0–77.0)
Neutro Abs: 7.6 10*3/uL (ref 1.4–7.7)
PLATELETS: 318 10*3/uL (ref 150.0–400.0)
RBC: 3.8 Mil/uL — AB (ref 3.87–5.11)
RDW: 13.3 % (ref 11.5–15.5)
WBC: 9 10*3/uL (ref 4.0–10.5)

## 2016-01-08 LAB — SEDIMENTATION RATE: Sed Rate: 110 mm/hr — ABNORMAL HIGH (ref 0–30)

## 2016-01-08 LAB — C-REACTIVE PROTEIN: CRP: 23.2 mg/dL — AB (ref 0.5–20.0)

## 2016-01-08 LAB — BASIC METABOLIC PANEL
BUN: 11 mg/dL (ref 6–23)
CALCIUM: 9.4 mg/dL (ref 8.4–10.5)
CO2: 29 meq/L (ref 19–32)
Chloride: 102 mEq/L (ref 96–112)
Creatinine, Ser: 0.72 mg/dL (ref 0.40–1.20)
GFR: 103.73 mL/min (ref >60.00)
Glucose, Bld: 131 mg/dL — ABNORMAL HIGH (ref 70–99)
Potassium: 3.6 mEq/L (ref 3.5–5.1)
SODIUM: 140 meq/L (ref 135–145)

## 2016-01-08 LAB — HEPATIC FUNCTION PANEL
ALK PHOS: 136 U/L — AB (ref 39–117)
ALT: 30 U/L (ref 0–35)
AST: 20 U/L (ref 0–37)
Albumin: 3.3 g/dL — ABNORMAL LOW (ref 3.5–5.2)
BILIRUBIN DIRECT: 0.2 mg/dL (ref 0.0–0.3)
BILIRUBIN TOTAL: 0.7 mg/dL (ref 0.2–1.2)
TOTAL PROTEIN: 7.3 g/dL (ref 6.0–8.3)

## 2016-01-08 LAB — CK: Total CK: 21 U/L (ref 7–177)

## 2016-01-08 MED ORDER — METHYLPREDNISOLONE 4 MG PO TBPK: ORAL_TABLET | ORAL | 0 refills | Status: DC

## 2016-01-08 NOTE — Progress Notes (Signed)
   Subjective:    Patient ID: Christy Kirby [name]), female    DOB: January 02, 1951, 65 y.o.   MRN: [id number]  HPI Here with her sister and daughter to follow up from our visit on 01-04-16. At the first visit she described headaches, fevers, a generalized rash, cramping pains in the hands, and nausea. We did labs which showed a normal WBC of 10.1 with a slight neurtophilic shift, a mildly elevated creatinine at 1.31, and mildly elevated liver enzymes with a TB of 1.6, AST of 59, and ALT of 59. We started her on Doxycycline to cover for possible tick bourne illnesses. Her IgG tests for Q fever and RMSF have returned as negative. We are still waiting for results for Lyme and EBV. In the past few days most of her symptoms have improved. Her hands no longer ache, the rash is gone, and the fever and nausea have resolved. She still has intermittent mild headaches but these have improved. A new symptom has appeared however and that is weakness and aching pains in both lower legs. No swelling is seen. She is drinking plenty of fluids.    Review of Systems  Constitutional: Positive for fatigue. Negative for chills, diaphoresis and fever.  Eyes: Negative.   Respiratory: Negative.   Cardiovascular: Negative.   Gastrointestinal: Negative.   Genitourinary: Negative.   Musculoskeletal: Positive for myalgias. Negative for arthralgias, back pain and joint swelling.  Neurological: Positive for weakness and headaches. Negative for dizziness, tremors, seizures, syncope, facial asymmetry, speech difficulty, light-headedness and numbness.  Hematological: Negative.        Objective:   Physical Exam  Constitutional: She is oriented to person, place, and time.  Alert, in a wheelchair because her legs hurt form walking down the hallway  Eyes: Conjunctivae are normal. Pupils are equal, round, and reactive to light. No scleral icterus.  Neck: Normal range of motion. Neck supple. No thyromegaly present.  Cardiovascular:  Normal rate, regular rhythm, normal heart sounds and intact distal pulses.   Pulmonary/Chest: Effort normal and breath sounds normal.  Abdominal: Soft. Bowel sounds are normal. She exhibits no distension and no mass. There is no tenderness. There is no rebound and no guarding.  No HSM   Musculoskeletal: Normal range of motion. She exhibits no edema or tenderness.  Her calves are not tender and no cords are felt   Lymphadenopathy:    She has no cervical adenopathy.  Neurological: She is alert and oriented to person, place, and time.          Assessment & Plan:  Here with continued symptoms that are difficult to define. I still think an infectious disease of some sort is involved. She will stay on the Doxycycline through this weekend. Recheck a BMET, liver panel, and CBC today. Add a CK, ESR, and CRP to look for muscle inflammation. We will refer to Infectious Disease to evaluate next week.  Laurey Morale, MD

## 2016-01-08 NOTE — Addendum Note (Signed)
Addended by: Alysia Penna A on: 01/08/2016 05:13 PM   Modules accepted: Orders

## 2016-01-08 NOTE — Addendum Note (Signed)
Addended by: Aggie Hacker A on: 01/08/2016 05:29 PM   Modules accepted: Orders

## 2016-01-08 NOTE — Progress Notes (Signed)
Pre visit review using our clinic review tool, if applicable. No additional management support is needed unless otherwise documented below in the visit note. Pt unable to weigh 

## 2016-01-11 LAB — ANTI-DNA ANTIBODY, DOUBLE-STRANDED: ds DNA Ab: <1 IU/mL

## 2016-01-11 LAB — ANA: Anti Nuclear Antibody(ANA): NEGATIVE

## 2016-01-14 ENCOUNTER — Telehealth: Payer: Self-pay | Admitting: Family Medicine

## 2016-01-14 NOTE — Telephone Encounter (Signed)
Pt would like results of her lastest labs

## 2016-01-14 NOTE — Telephone Encounter (Signed)
Attempted to contact patient, mailbox is full, unable to leave message. The most recent labs that Dr. Sarajane Jews did have not come back from [place] yet.

## 2016-01-15 ENCOUNTER — Telehealth: Payer: Self-pay

## 2016-01-15 NOTE — Telephone Encounter (Signed)
Received call back from pt's daughter, Adonis Huguenin. Informed them that the Mono, Lupus, [facility], and Lyme disease tests have not come back yet. Daughter verbalized understanding and would prefer that we contact her with the results at [phone] when they come in because it is easier for her to understand rather than the patient.

## 2016-01-25 ENCOUNTER — Encounter: Payer: Self-pay | Admitting: Family Medicine

## 2016-02-18 DIAGNOSIS — Z6825 Body mass index (BMI) 25.0-25.9, adult: Secondary | ICD-10-CM | POA: Diagnosis not present

## 2016-02-18 DIAGNOSIS — Z124 Encounter for screening for malignant neoplasm of cervix: Secondary | ICD-10-CM | POA: Diagnosis not present

## 2016-02-18 DIAGNOSIS — Z1231 Encounter for screening mammogram for malignant neoplasm of breast: Secondary | ICD-10-CM | POA: Diagnosis not present

## 2016-02-25 ENCOUNTER — Ambulatory Visit: Payer: Commercial Managed Care - HMO | Admitting: Internal Medicine

## 2016-06-16 DIAGNOSIS — M25552 Pain in left hip: Secondary | ICD-10-CM | POA: Diagnosis not present

## 2016-06-16 DIAGNOSIS — M1611 Unilateral primary osteoarthritis, right hip: Secondary | ICD-10-CM | POA: Diagnosis not present

## 2017-02-03 ENCOUNTER — Other Ambulatory Visit: Payer: Self-pay | Admitting: Family Medicine

## 2017-02-03 NOTE — Telephone Encounter (Signed)
Can we refill this? 

## 2017-02-08 ENCOUNTER — Encounter: Payer: Self-pay | Admitting: Family Medicine

## 2017-02-08 ENCOUNTER — Ambulatory Visit (INDEPENDENT_AMBULATORY_CARE_PROVIDER_SITE_OTHER): Payer: Medicare HMO | Admitting: Family Medicine

## 2017-02-08 VITALS — BP 140/82 | HR 81 | Temp 98.7°F | Ht 64.75 in | Wt 147.0 lb

## 2017-02-08 DIAGNOSIS — M67441 Ganglion, right hand: Secondary | ICD-10-CM | POA: Diagnosis not present

## 2017-02-08 DIAGNOSIS — R030 Elevated blood-pressure reading, without diagnosis of hypertension: Secondary | ICD-10-CM | POA: Insufficient documentation

## 2017-02-08 DIAGNOSIS — Z0000 Encounter for general adult medical examination without abnormal findings: Secondary | ICD-10-CM | POA: Diagnosis not present

## 2017-02-08 DIAGNOSIS — E782 Mixed hyperlipidemia: Secondary | ICD-10-CM | POA: Diagnosis not present

## 2017-02-08 DIAGNOSIS — K219 Gastro-esophageal reflux disease without esophagitis: Secondary | ICD-10-CM

## 2017-02-08 DIAGNOSIS — M25559 Pain in unspecified hip: Secondary | ICD-10-CM | POA: Diagnosis not present

## 2017-02-08 DIAGNOSIS — M5440 Lumbago with sciatica, unspecified side: Secondary | ICD-10-CM

## 2017-02-08 LAB — CBC WITH DIFFERENTIAL/PLATELET
Basophils Absolute: 0 10*3/uL (ref 0.0–0.1)
Basophils Relative: 1.2 % (ref 0.0–3.0)
Eosinophils Absolute: 0.1 10*3/uL (ref 0.0–0.7)
Eosinophils Relative: 1.5 % (ref 0.0–5.0)
HCT: 37.4 % (ref 36.0–46.0)
Hemoglobin: 12.4 g/dL (ref 12.0–15.0)
LYMPHS ABS: 1.3 10*3/uL (ref 0.7–4.0)
Lymphocytes Relative: 33.2 % (ref 12.0–46.0)
MCHC: 33.1 g/dL (ref 30.0–36.0)
MCV: 91.4 fl (ref 78.0–100.0)
MONOS PCT: 6.5 % (ref 3.0–12.0)
Monocytes Absolute: 0.3 10*3/uL (ref 0.1–1.0)
NEUTROS ABS: 2.3 10*3/uL (ref 1.4–7.7)
NEUTROS PCT: 57.6 % (ref 43.0–77.0)
PLATELETS: 296 10*3/uL (ref 150.0–400.0)
RBC: 4.09 Mil/uL (ref 3.87–5.11)
RDW: 13.2 % (ref 11.5–15.5)
WBC: 4 10*3/uL (ref 4.0–10.5)

## 2017-02-08 LAB — BASIC METABOLIC PANEL
BUN: 13 mg/dL (ref 6–23)
CHLORIDE: 102 meq/L (ref 96–112)
CO2: 29 meq/L (ref 19–32)
Calcium: 9.7 mg/dL (ref 8.4–10.5)
Creatinine, Ser: 0.74 mg/dL (ref 0.40–1.20)
GFR: 100.17 mL/min (ref >60.00)
Glucose, Bld: 80 mg/dL (ref 70–99)
Potassium: 4.1 mEq/L (ref 3.5–5.1)
Sodium: 139 mEq/L (ref 135–145)

## 2017-02-08 LAB — HEPATIC FUNCTION PANEL
ALT: 9 U/L (ref 0–35)
AST: 14 U/L (ref 0–37)
Albumin: 4 g/dL (ref 3.5–5.2)
Alkaline Phosphatase: 47 U/L (ref 39–117)
BILIRUBIN DIRECT: 0.1 mg/dL (ref 0.0–0.3)
BILIRUBIN TOTAL: 0.4 mg/dL (ref 0.2–1.2)
Total Protein: 7.4 g/dL (ref 6.0–8.3)

## 2017-02-08 LAB — LIPID PANEL
CHOL/HDL RATIO: 5
Cholesterol: 224 mg/dL — ABNORMAL HIGH (ref 0–200)
HDL: 46.1 mg/dL (ref >39.00)
LDL Cholesterol: 161 mg/dL — ABNORMAL HIGH (ref 0–99)
NonHDL: 178.22
TRIGLYCERIDES: 86 mg/dL (ref 0.0–149.0)
VLDL: 17.2 mg/dL (ref 0.0–40.0)

## 2017-02-08 LAB — POC URINALSYSI DIPSTICK (AUTOMATED)
BILIRUBIN UA: NEGATIVE
Blood, UA: NEGATIVE
CLARITY UA: NEGATIVE
Glucose, UA: NEGATIVE
KETONES UA: NEGATIVE
LEUKOCYTES UA: NEGATIVE
Nitrite, UA: NEGATIVE
Protein, UA: NEGATIVE
Spec Grav, UA: 1.015 (ref 1.010–1.025)
Urobilinogen, UA: 0.2 E.U./dL
pH, UA: 6.5 (ref 5.0–8.0)

## 2017-02-08 LAB — TSH: TSH: 0.49 u[IU]/mL (ref 0.35–4.50)

## 2017-02-08 NOTE — Progress Notes (Signed)
   Subjective:    Patient ID: Christy Kirby), female    DOB: January 19, 1951, 66 y.o.   MRN: [id number]  HPI Here to follow up on several issues. She feels well in general but about one month ago a painful lump appeared on her right 4th finger. No hx of trauma. Her hip pain still bothers her but Ibuprofen helps. Her BP is stable. She is past due for a colonoscopy.    Review of Systems  Constitutional: Negative.   HENT: Negative.   Eyes: Negative.   Respiratory: Negative.   Cardiovascular: Negative.   Gastrointestinal: Negative.   Genitourinary: Negative for decreased urine volume, difficulty urinating, dyspareunia, dysuria, enuresis, flank pain, frequency, hematuria, pelvic pain and urgency.  Musculoskeletal: Positive for arthralgias and back pain. Negative for neck pain and neck stiffness.  Skin: Negative.   Neurological: Negative.   Psychiatric/Behavioral: Negative.        Objective:   Physical Exam  Constitutional: She is oriented to person, place, and time. She appears well-developed and well-nourished. No distress.  HENT:  Head: Normocephalic and atraumatic.  Right Ear: External ear normal.  Left Ear: External ear normal.  Nose: Nose normal.  Mouth/Throat: Oropharynx is clear and moist. No oropharyngeal exudate.  Eyes: Pupils are equal, round, and reactive to light. Conjunctivae and EOM are normal. No scleral icterus.  Neck: Normal range of motion. Neck supple. No JVD present. No thyromegaly present.  Cardiovascular: Normal rate, regular rhythm, normal heart sounds and intact distal pulses.  Exam reveals no gallop and no friction rub.   No murmur heard. Pulmonary/Chest: Effort normal and breath sounds normal. No respiratory distress. She has no wheezes. She has no rales. She exhibits no tenderness.  Abdominal: Soft. Bowel sounds are normal. She exhibits no distension and no mass. There is no tenderness. There is no rebound and no guarding.  Musculoskeletal: Normal range  of motion. She exhibits no edema.  The right ring finger has a tender round firm lesion off the DIP joint, full ROM   Lymphadenopathy:    She has no cervical adenopathy.  Neurological: She is alert and oriented to person, place, and time. She has normal reflexes. No cranial nerve deficit. She exhibits normal muscle tone. Coordination normal.  Skin: Skin is warm and dry. No rash noted. No erythema.  Psychiatric: She has a normal mood and affect. Her behavior is normal. Judgment and thought content normal.          Assessment & Plan:  Her BP is stable. Get fasting labs today to check her lipids, etc. She has a ganglion cyst on the finger and we will refer her to see Hand Surgery. Set up another colonoscopy. Her back and hip pain is stable.  Christy Penna, MD

## 2017-02-08 NOTE — Patient Instructions (Signed)
WE NOW OFFER   Christy Kirby's FAST TRACK!!!  SAME DAY Appointments for ACUTE CARE  Such as: Sprains, Injuries, cuts, abrasions, rashes, muscle pain, joint pain, back pain Colds, flu, sore throats, headache, allergies, cough, fever  Ear pain, sinus and eye infections Abdominal pain, nausea, vomiting, diarrhea, upset stomach Animal/insect bites  3 Easy Ways to Schedule: Walk-In Scheduling Call in scheduling Mychart Sign-up: [email or web address]         

## 2017-02-09 ENCOUNTER — Other Ambulatory Visit: Payer: Self-pay | Admitting: Family Medicine

## 2017-02-09 MED ORDER — ATORVASTATIN CALCIUM 20 MG PO TABS: 20.0000 mg | ORAL_TABLET | Freq: Every day | ORAL | 11 refills | Status: DC

## 2017-03-10 ENCOUNTER — Encounter: Payer: Self-pay | Admitting: Family Medicine

## 2017-03-15 ENCOUNTER — Ambulatory Visit: Payer: Medicare HMO

## 2017-03-16 NOTE — Progress Notes (Addendum)
Subjective:   Christy Kirby [place]) is a 66 y.o. female who presents for an Initial Medicare Annual Wellness Visit.  The Patient was informed that the wellness visit is to identify future health risk and educate and initiate measures that can reduce risk for increased disease through the lifespan.    Annual Wellness Assessment  Reports health as fair Hip hurts and needs hip replacement  C/o of pain in lower right leg as well x 1 year Happens at times in the bed at hs; but states it is not a cramp  Ortho operated on back  Apt recommended with Dr. Sarajane Jews today to review for acute symptoms   Preventive Screening -Counseling & Management  Medicare Annual Preventive Care Visit - Subsequent Last OV 09/05; elevated BP   Colonoscopy 09/2010; repeat in 5 years DUE  Dr. Henrene Pastor  Health Maintenance Due  Topic Date Due  . Hepatitis C Screening  February 15, 1951  . MAMMOGRAM  06/11/1998  . COLONOSCOPY  09/07/2015  last mamogram 2006 Note that she was seeing Elyse Hsu, GYN  GYN will schedule mammogram and preventive needs  Does not take the flu vaccine Does not want to take the pneumonia vaccine  Mammogram - every year at Dr. Corinna Capra  Has an apt 10/25; and he will do the mammogram as noted   Colonoscopy - per Dr Henrene Pastor report 09/2010 To repeat in 5 years and was due 09/2015     VS reviewed;  BP up but sister passed away x 1 month ago  Discussed sodium;  States she has been eating the same thing for ever  Noted in pain due to hip and needs surgery   Diet  Not a sweet eater Breakfast sausage, eggs and grits  Eats well most of the time    BMI - 23.9   Exercise Can't exercise due to hip Does house work Lives with spouse   Dental - haven't had but no issues     Pain? Hip , may take ibuprofen when going out 1- 10 pain is 4 to 5 when out and walking  Avoiding activities; does take ibuprofen if out    Cardiac Risk Factors Addressed Hyperlipidemia - chol ratio 5; chol 224; hdl  46; ldld 161 and trig 86 Diabetes neg    Advanced Directives no Advanced Directive; Reviewed advanced directive and agreed to receipt of information and discussion.  Focused face to face x  20 minutes discussing HCPOA and Living will and reviewed all the questions in the [employer] forms. The patient voices understanding of HCPOA; LW reviewed and information provided on each question. Educated on how to revoke this HCPOA or LW at any time.   Also  discussed life prolonging measures (given a few examples) and where she could choose to initiate or not;  the ability to given the HCPOA power to change her living will or not if she cannot speak for herself; as well as finalizing the will by 2 unrelated witnesses and notary.  Will call for questions and given information on [facility] pastoral department for further assistance.      Patient Care Team: Laurey Morale, MD as PCP - General See GYN         Objective:    Today's Vitals   03/17/17 0909  BP: 140/90  Pulse: 74  SpO2: 95%  Weight: 146 lb (66.2 kg)  Height: 5' 5.5" (1.664 m)   Body mass index is 23.93 kg/m.   Current Medications (verified)  Outpatient Encounter Prescriptions as of 03/17/2017  Medication Sig  . ibuprofen (ADVIL,MOTRIN) 800 MG tablet TAKE 1 TABLET (800 MG TOTAL) BY MOUTH EVERY 6 (SIX) HOURS AS NEEDED FOR FEVER OR MODERATE PAIN.  Marland Kitchen atorvastatin (LIPITOR) 20 MG tablet Take 1 tablet (20 mg total) by mouth daily. (Patient not taking: Reported on 03/17/2017)   No facility-administered encounter medications on file as of 03/17/2017.     Allergies (verified) Codeine   History: Past Medical History:  Diagnosis Date  . GERD (gastroesophageal reflux disease)    Past Surgical History:  Procedure Laterality Date  . COLONOSCOPY  09-07-10   per Dr. Henrene Pastor, benign polyps, repeat in 5 yrs   . NEUROPLASTY / TRANSPOSITION MEDIAN NERVE AT CARPAL TUNNEL BILATERAL     Family History  Problem Relation Age of Onset  .  Diabetes Sister    Social History   Occupational History  . Not on file.   Social History Main Topics  . Smoking status: Former Research scientist (life sciences)  . Smokeless tobacco: Former Systems developer    Quit date: 08/23/1968     Comment: only when 16 briefly   . Alcohol use No  . Drug use: No  . Sexual activity: Not on file    Tobacco Counseling Counseling given: Yes   Activities of Daily Living In your present state of health, do you have any difficulty performing the following activities: 03/17/2017  Hearing? N  Vision? N  Difficulty concentrating or making decisions? N  Walking or climbing stairs? N  Dressing or bathing? N  Doing errands, shopping? N  Preparing Food and eating ? N  Using the Toilet? N  In the past six months, have you accidently leaked urine? N  Do you have problems with loss of bowel control? N  Managing your Medications? N  Managing your Finances? N  Housekeeping or managing your Housekeeping? N  Some recent data might be hidden    Immunizations and Health Maintenance  There is no immunization history on file for this patient. Health Maintenance Due  Topic Date Due  . Hepatitis C Screening  08/03/1950  . MAMMOGRAM  06/11/1998  . COLONOSCOPY  09/07/2015    Patient Care Team: Laurey Morale, MD as PCP - General  Indicate any recent Medical Services you may have received from other than [facility] providers in the past year (date may be approximate).     Assessment:   This is a routine wellness examination for Christy Kirby.   Hearing/Vision screen Hearing Screening Comments: Hearing not bad  Vision Screening Comments: Vision checks  Will set up apt for vision   Dietary issues and exercise activities discussed: Current Exercise Habits: Home exercise routine, Type of exercise: strength training/weights, Time (Minutes): 60, Frequency (Times/Week): 4, Weekly Exercise (Minutes/Week): 240, Intensity: Mild (housekeeping )  Goals    . patient          Would like the pain to cease  in hip       Depression Screen PHQ 2/9 Scores 03/17/2017 11/14/2014  PHQ - 2 Score 0 0    Fall Risk Fall Risk  03/17/2017 11/14/2014  Falls in the past year? No No    Cognitive Function:    no issues to date  ad8 score 0     Screening Tests Health Maintenance  Topic Date Due  . Hepatitis C Screening  January 31, 1951  . MAMMOGRAM  06/11/1998  . COLONOSCOPY  09/07/2015  . DEXA SCAN  06/05/2017 (Originally 06/11/2013)  . INFLUENZA VACCINE  02/08/2018 (  Originally 01/04/2017)  . TETANUS/TDAP  03/06/2018 (Originally 06/12/1967)  . PNA vac Low Risk Adult (1 of 2 - PCV13) 03/06/2018 (Originally 06/11/2013)      Plan:       PCP Notes   Health Maintenance Declines flu vaccine Declines pneumonia vaccine Will take tetanus if injured Apt made with GYN for mammogram Discussed having GYN follow up on DEXA and education provided  Colonoscopy; to fup with [place] to see if she had colonoscopy there and will try to get her scheduled  She had colonoscopy 09/2010 and was due in 2017 Will call the GI center at [facility] and have them call to schedule States her intent is to proceed   Abnormal Screens  BP elevated  Due to hip pain or recent loss of sister Or familiar  Educated regarding BP reading and suggested she keep an eye on it   Referrals  [name] to fup with GI outreach   Patient concerns; Pain in left LE BK; also bump in other knee. To see Dr. Sarajane Jews today  Nurse Concerns; Not taking statin;    Next PCP apt agreed to work in today       I have personally reviewed and noted the following in the patient's chart:   . Medical and social history . Use of alcohol, tobacco or illicit drugs  . Current medications and supplements . Functional ability and status . Nutritional status . Physical activity . Advanced directives . List of other physicians . Hospitalizations, surgeries, and ER visits in previous 12 months . Vitals . Screenings to include cognitive, depression, and  falls . Referrals and appointments  In addition, I have reviewed and discussed with patient certain preventive protocols, quality metrics, and best practice recommendations. A written personalized care plan for preventive services as well as general preventive health recommendations were provided to patient.     EPPIR,JJOAC, RN   03/17/2017   I have reviewed this note and agree with its contents.  Alysia Penna, MD

## 2017-03-17 ENCOUNTER — Encounter: Payer: Self-pay | Admitting: Family Medicine

## 2017-03-17 ENCOUNTER — Telehealth: Payer: Self-pay

## 2017-03-17 ENCOUNTER — Ambulatory Visit (INDEPENDENT_AMBULATORY_CARE_PROVIDER_SITE_OTHER): Payer: Medicare HMO | Admitting: Family Medicine

## 2017-03-17 ENCOUNTER — Ambulatory Visit (INDEPENDENT_AMBULATORY_CARE_PROVIDER_SITE_OTHER): Payer: Medicare HMO

## 2017-03-17 VITALS — BP 140/90 | HR 68 | Wt 146.0 lb

## 2017-03-17 VITALS — BP 140/90 | HR 74 | Ht 65.5 in | Wt 146.0 lb

## 2017-03-17 DIAGNOSIS — Z0000 Encounter for general adult medical examination without abnormal findings: Secondary | ICD-10-CM | POA: Diagnosis not present

## 2017-03-17 DIAGNOSIS — M79661 Pain in right lower leg: Secondary | ICD-10-CM | POA: Diagnosis not present

## 2017-03-17 DIAGNOSIS — Z1159 Encounter for screening for other viral diseases: Secondary | ICD-10-CM

## 2017-03-17 NOTE — Progress Notes (Signed)
   Subjective:    Patient ID: Pecola Lawless [name]), female    DOB: 03-17-1951, 66 y.o.   MRN: [id number]  HPI Here for intermittent pains in the right lower leg that started about one year ago. These are deep seated aching pains along the lateral lower leg. No hx of trauma. No swelling. It never hurts to walk on it. The pains occur mostly at night and they occur about twice a week.    Review of Systems  Constitutional: Negative.   Respiratory: Negative.   Cardiovascular: Negative.   Musculoskeletal: Positive for myalgias.       Objective:   Physical Exam  Constitutional: She appears well-developed and well-nourished.  Walks normally   Cardiovascular: Normal rate, regular rhythm, normal heart sounds and intact distal pulses.   Pulmonary/Chest: Effort normal and breath sounds normal. No respiratory distress. She has no rales.  Musculoskeletal:  The right lower leg is normal on exam, no tenderness or swelling           Assessment & Plan:  Right lower leg pain. This seems to be either a nerve pain or a bone pain. We will get Xrays of the tibia and fibula.  Alysia Penna, MD

## 2017-03-17 NOTE — Patient Instructions (Addendum)
[name]) , Thank you for taking time to come for your Medicare Wellness Visit. I appreciate your ongoing commitment to your health goals. Please review the following plan we discussed and let me know if I can assist you in the future.  Minimal Blood Pressure Goal= AVERAGE < 140/90; Ideal is an AVERAGE < 135/85. This AVERAGE should be calculated from @ least 5-7 BP readings taken @ different times of day on different days of week. You should not respond to isolated BP readings , but rather the AVERAGE for that week .Please bring your blood pressure cuff to office visits to verify that it is reliable.It can also be checked against the blood pressure device at the pharmacy. Finger or wrist cuffs are not dependable; an arm cuff is.  Also, monitor sodium intake in label of any canned food, as well as sodium in common everyday condiments; ketchup; salad dressing, sauces and any fast food restaurant.   Medicare now request all "baby boomers" test for possible exposure to Hepatitis C. Many may have been exposed due to dental work, tatoo's, vaccinations when young. The Hepatitis C virus is dormant for many years and then sometimes will cause liver cancer. If you gave blood in the past 15 years, you were most likely checked for Hep C. If you rec'd blood; you may want to consider testing or if you are high risk for any other reason.    A Tetanus is recommended every 10 years. Medicare covers a tetanus if you have a cut or wound; otherwise, there may be a charge. If you had not had a tetanus with pertusses, known as the Tdap, you can take this anytime.   Keep in mind the flu shot is an inactivated vaccine and takes at least 2 weeks to build immunity. The flu virus can be dormant for 4 days prior to symptoms Taking the flu shot at the beginning of the season can reduce the risk for the entire community.    The Centers for Disease Control are now recommending 2 pneumonia vaccinations after  53. The first is the Prevnar 13. This helps to boost your immunity to community acquired pneumonia as well as some protection from bacterial pneumonia  The 2nd is the pneumovax 23, which offers more broad protection!  Please consider taking these as this is your best protection against pneumonia.  At 4, you will need a DEXA or bone density  Will check with GYN about his to see if he is ordering and following If not, please let us know so we can order  Christy Kirby will call GI to see if they have seen you in the past. If not, you will find out where you had your last colonoscopy and call them for an apt  Be proactive   Needs apt for eye visit   [place], [place] [address] P: [phone]  Can go to the dental school at [place] for free cleaning         These are the goals we discussed: Goals    . patient          Would like the pain to cease in hip        This is a list of the screening recommended for you and due dates:  Health Maintenance  Topic Date Due  .  Hepatitis C: One time screening is recommended by Center for Disease Control  (CDC) for  adults born from [birth year] through 1967.  September 16, 1950  . Tetanus Vaccine  06/12/1967  . Mammogram  06/11/1998  . DEXA scan (bone density measurement)  06/11/2013  . Pneumonia vaccines (1 of 2 - PCV13) 06/11/2013  . Colon Cancer Screening  09/07/2015  . Flu Shot  02/08/2018*  *Topic was postponed. The date shown is not the original due date.    DASH Eating Plan DASH stands for "Dietary Approaches to Stop Hypertension." The DASH eating plan is a healthy eating plan that has been shown to reduce high blood pressure (hypertension). It may also reduce your risk for type 2 diabetes, heart disease, and stroke. The DASH eating plan may also help with weight loss. What are tips for following this plan? General guidelines  Avoid eating more than 2,300 mg (milligrams) of salt (sodium) a day. If you have hypertension,  you may need to reduce your sodium intake to 1,500 mg a day.  Limit alcohol intake to no more than 1 drink a day for nonpregnant women and 2 drinks a day for men. One drink equals 12 oz of beer, 5 oz of wine, or 1 oz of hard liquor.  Work with your health care provider to maintain a healthy body weight or to lose weight. Ask what an ideal weight is for you.  Get at least 30 minutes of exercise that causes your heart to beat faster (aerobic exercise) most days of the week. Activities may include walking, swimming, or biking.  Work with your health care provider or diet and nutrition specialist (dietitian) to adjust your eating plan to your individual calorie needs. Reading food labels  Check food labels for the amount of sodium per serving. Choose foods with less than 5 percent of the Daily Value of sodium. Generally, foods with less than 300 mg of sodium per serving fit into this eating plan.  To find whole grains, look for the word "whole" as the first word in the ingredient list. Shopping  Buy products labeled as "low-sodium" or "no salt added."  Buy fresh foods. Avoid canned foods and premade or frozen meals. Cooking  Avoid adding salt when cooking. Use salt-free seasonings or herbs instead of table salt or sea salt. Check with your health care provider or pharmacist before using salt substitutes.  Do not fry foods. Cook foods using healthy methods such as baking, boiling, grilling, and broiling instead.  Cook with heart-healthy oils, such as olive, canola, soybean, or sunflower oil. Meal planning   Eat a balanced diet that includes: ? 5 or more servings of fruits and vegetables each day. At each meal, try to fill half of your plate with fruits and vegetables. ? Up to 6-8 servings of whole grains each day. ? Less than 6 oz of lean meat, poultry, or fish each day. A 3-oz serving of meat is about the same size as a deck of cards. One egg equals 1 oz. ? 2 servings of low-fat dairy  each day. ? A serving of nuts, seeds, or beans 5 times each week. ? Heart-healthy fats. Healthy fats called Omega-3 fatty acids are found in foods such as flaxseeds and coldwater fish, like sardines, salmon, and mackerel.  Limit how much you eat of the following: ? Canned or prepackaged foods. ? Food that is high in trans fat, such as fried foods. ? Food that is high in saturated fat, such as fatty meat. ? Sweets, desserts, sugary drinks, and other foods with added sugar. ? Full-fat dairy products.  Do not salt foods before eating.  Try to eat at least 2 vegetarian meals each week.  Eat more home-cooked food and less restaurant, buffet, and fast food.  When eating at a restaurant, ask that your food be prepared with less salt or no salt, if possible. What foods are recommended? The items listed may not be a complete list. Talk with your dietitian about what dietary choices are best for you. Grains Whole-grain or whole-wheat bread. Whole-grain or whole-wheat pasta. Brown rice. Christy Kirby. Bulgur. Whole-grain and low-sodium cereals. Pita bread. Low-fat, low-sodium crackers. Whole-wheat flour tortillas. Vegetables Fresh or frozen vegetables (raw, steamed, roasted, or grilled). Low-sodium or reduced-sodium tomato and vegetable juice. Low-sodium or reduced-sodium tomato sauce and tomato paste. Low-sodium or reduced-sodium canned vegetables. Fruits All fresh, dried, or frozen fruit. Canned fruit in natural juice (without added sugar). Meat and other protein foods Skinless chicken or [place]. Ground chicken or [place]. Pork with fat trimmed off. Fish and seafood. Egg whites. Dried beans, peas, or lentils. Unsalted nuts, nut butters, and seeds. Unsalted canned beans. Lean cuts of beef with fat trimmed off. Low-sodium, lean deli meat. Dairy Low-fat (1%) or fat-free (skim) milk. Fat-free, low-fat, or reduced-fat cheeses. Nonfat, low-sodium ricotta or cottage cheese. Low-fat or nonfat yogurt.  Low-fat, low-sodium cheese. Fats and oils Soft margarine without trans fats. Vegetable oil. Low-fat, reduced-fat, or light mayonnaise and salad dressings (reduced-sodium). Canola, safflower, olive, soybean, and sunflower oils. Avocado. Seasoning and other foods Herbs. Spices. Seasoning mixes without salt. Unsalted popcorn and pretzels. Fat-free sweets. What foods are not recommended? The items listed may not be a complete list. Talk with your dietitian about what dietary choices are best for you. Grains Baked goods made with fat, such as croissants, muffins, or some breads. Dry pasta or rice meal packs. Vegetables Creamed or fried vegetables. Vegetables in a cheese sauce. Regular canned vegetables (not low-sodium or reduced-sodium). Regular canned tomato sauce and paste (not low-sodium or reduced-sodium). Regular tomato and vegetable juice (not low-sodium or reduced-sodium). Christy Kirby. Olives. Fruits Canned fruit in a light or heavy syrup. Fried fruit. Fruit in cream or butter sauce. Meat and other protein foods Fatty cuts of meat. Ribs. Fried meat. Christy Kirby. Sausage. Bologna and other processed lunch meats. Salami. Fatback. Hotdogs. Bratwurst. Salted nuts and seeds. Canned beans with added salt. Canned or smoked fish. Whole eggs or egg yolks. Chicken or [place] with skin. Dairy Whole or 2% milk, cream, and half-and-half. Whole or full-fat cream cheese. Whole-fat or sweetened yogurt. Full-fat cheese. Nondairy creamers. Whipped toppings. Processed cheese and cheese spreads. Fats and oils Butter. Stick margarine. Lard. Shortening. Ghee. Bacon fat. Tropical oils, such as coconut, palm kernel, or palm oil. Seasoning and other foods Salted popcorn and pretzels. Onion salt, garlic salt, seasoned salt, table salt, and sea salt. Worcestershire sauce. Tartar sauce. Barbecue sauce. Teriyaki sauce. Soy sauce, including reduced-sodium. Steak sauce. Canned and packaged gravies. Fish sauce. Oyster sauce. Cocktail  sauce. Horseradish that you find on the shelf. Ketchup. Mustard. Meat flavorings and tenderizers. Bouillon cubes. Hot sauce and Tabasco sauce. Premade or packaged marinades. Premade or packaged taco seasonings. Relishes. Regular salad dressings. Where to find more information:  National Heart, Lung, and [place]: https://wilson-eaton.com/  American Heart Association: www.heart.org Summary  The DASH eating plan is a healthy eating plan that has been shown to reduce high blood pressure (hypertension). It may also reduce your risk for type 2 diabetes, heart disease, and stroke.  With the DASH eating plan, you should limit salt (sodium) intake to 2,300 mg a day. If  you have hypertension, you may need to reduce your sodium intake to 1,500 mg a day.  When on the DASH eating plan, aim to eat more fresh fruits and vegetables, whole grains, lean proteins, low-fat dairy, and heart-healthy fats.  Work with your health care provider or diet and nutrition specialist (dietitian) to adjust your eating plan to your individual calorie needs. This information is not intended to replace advice given to you by your health care provider. Make sure you discuss any questions you have with your health care provider. Document Released: 05/12/2011 Document Revised: 05/16/2016 Document Reviewed: 05/16/2016 Elsevier Interactive Patient Education  2017 [place].  Prevention of falls: Remove rugs or any tripping hazards in the home Use Non slip mats in bathtubs and showers Placing grab bars next to the toilet and or shower Placing handrails on both sides of the stair way Adding extra lighting in the home.   Personal safety issues reviewed:  1. Consider starting a community watch program per [facility] 2.  Changes batteries is smoke detector and/or carbon monoxide detector  3.  If you have firearms; keep them in a safe place 4.  Wear protection when in the sun; Always wear sunscreen or a hat; It is  good to have your doctor check your skin annually or review any new areas of concern 5. Driving safety; Keep in the right lane; stay 3 car lengths behind the car in front of you on the highway; look 3 times prior to pulling out; carry your cell phone everywhere you go!    Learn about the Yellow Dot program:  The program allows first responders at your emergency to have access to who your physician is, as well as your medications and medical conditions.  Citizens requesting the Yellow Dot Packages should contact Master Corporal Nunzio Cobbs at the [facility] [phone] for the first week of the program and beginning the week after Easter citizens should contact their Scientist, physiological.     Health Maintenance for Postmenopausal Women Menopause is a normal process in which your reproductive ability comes to an end. This process happens gradually over a span of months to years, usually between the ages of 78 and 31. Menopause is complete when you have missed 12 consecutive menstrual periods. It is important to talk with your health care provider about some of the most common conditions that affect postmenopausal women, such as heart disease, cancer, and bone loss (osteoporosis). Adopting a healthy lifestyle and getting preventive care can help to promote your health and wellness. Those actions can also lower your chances of developing some of these common conditions. What should I know about menopause? During menopause, you may experience a number of symptoms, such as:  Moderate-to-severe hot flashes.  Night sweats.  Decrease in sex drive.  Mood swings.  Headaches.  Tiredness.  Irritability.  Memory problems.  Insomnia.  Choosing to treat or not to treat menopausal changes is an individual decision that you make with your health care provider. What should I know about hormone replacement therapy and supplements? Hormone therapy products are  effective for treating symptoms that are associated with menopause, such as hot flashes and night sweats. Hormone replacement carries certain risks, especially as you become older. If you are thinking about using estrogen or estrogen with progestin treatments, discuss the benefits and risks with your health care provider. What should I know about heart disease and stroke? Heart disease, heart attack, and stroke become more likely  as you age. This may be due, in part, to the hormonal changes that your body experiences during menopause. These can affect how your body processes dietary fats, triglycerides, and cholesterol. Heart attack and stroke are both medical emergencies. There are many things that you can do to help prevent heart disease and stroke:  Have your blood pressure checked at least every 1-2 years. High blood pressure causes heart disease and increases the risk of stroke.  If you are 50-17 years old, ask your health care provider if you should take aspirin to prevent a heart attack or a stroke.  Do not use any tobacco products, including cigarettes, chewing tobacco, or electronic cigarettes. If you need help quitting, ask your health care provider.  It is important to eat a healthy diet and maintain a healthy weight. ? Be sure to include plenty of vegetables, fruits, low-fat dairy products, and lean protein. ? Avoid eating foods that are high in solid fats, added sugars, or salt (sodium).  Get regular exercise. This is one of the most important things that you can do for your health. ? Try to exercise for at least 150 minutes each week. The type of exercise that you do should increase your heart rate and make you sweat. This is known as moderate-intensity exercise. ? Try to do strengthening exercises at least twice each week. Do these in addition to the moderate-intensity exercise.  Know your numbers.Ask your health care provider to check your cholesterol and your blood glucose.  Continue to have your blood tested as directed by your health care provider.  What should I know about cancer screening? There are several types of cancer. Take the following steps to reduce your risk and to catch any cancer development as early as possible. Breast Cancer  Practice breast self-awareness. ? This means understanding how your breasts normally appear and feel. ? It also means doing regular breast self-exams. Let your health care provider know about any changes, no matter how small.  If you are 44 or older, have a clinician do a breast exam (clinical breast exam or CBE) every year. Depending on your age, family history, and medical history, it may be recommended that you also have a yearly breast X-ray (mammogram).  If you have a family history of breast cancer, talk with your health care provider about genetic screening.  If you are at high risk for breast cancer, talk with your health care provider about having an MRI and a mammogram every year.  Breast cancer (BRCA) gene test is recommended for women who have family members with BRCA-related cancers. Results of the assessment will determine the need for genetic counseling and BRCA1 and for BRCA2 testing. BRCA-related cancers include these types: ? Breast. This occurs in males or females. ? Ovarian. ? Tubal. This may also be called fallopian tube cancer. ? Cancer of the abdominal or pelvic lining (peritoneal cancer). ? Prostate. ? Pancreatic.  Cervical, Uterine, and Ovarian Cancer Your health care provider may recommend that you be screened regularly for cancer of the pelvic organs. These include your ovaries, uterus, and vagina. This screening involves a pelvic exam, which includes checking for microscopic changes to the surface of your cervix (Pap test).  For women ages 21-65, health care providers may recommend a pelvic exam and a Pap test every three years. For women ages 62-65, they may recommend the Pap test and pelvic  exam, combined with testing for human papilloma virus (HPV), every five years. Some types of HPV  increase your risk of cervical cancer. Testing for HPV may also be done on women of any age who have unclear Pap test results.  Other health care providers may not recommend any screening for nonpregnant women who are considered low risk for pelvic cancer and have no symptoms. Ask your health care provider if a screening pelvic exam is right for you.  If you have had past treatment for cervical cancer or a condition that could lead to cancer, you need Pap tests and screening for cancer for at least 20 years after your treatment. If Pap tests have been discontinued for you, your risk factors (such as having a new sexual partner) need to be reassessed to determine if you should start having screenings again. Some women have medical problems that increase the chance of getting cervical cancer. In these cases, your health care provider may recommend that you have screening and Pap tests more often.  If you have a family history of uterine cancer or ovarian cancer, talk with your health care provider about genetic screening.  If you have vaginal bleeding after reaching menopause, tell your health care provider.  There are currently no reliable tests available to screen for ovarian cancer.  Lung Cancer Lung cancer screening is recommended for adults 43-64 years old who are at high risk for lung cancer because of a history of smoking. A yearly low-dose CT scan of the lungs is recommended if you:  Currently smoke.  Have a history of at least 30 pack-years of smoking and you currently smoke or have quit within the past 15 years. A pack-year is smoking an average of one pack of cigarettes per day for one year.  Yearly screening should:  Continue until it has been 15 years since you quit.  Stop if you develop a health problem that would prevent you from having lung cancer treatment.  Colorectal  Cancer  This type of cancer can be detected and can often be prevented.  Routine colorectal cancer screening usually begins at age 40 and continues through age 77.  If you have risk factors for colon cancer, your health care provider may recommend that you be screened at an earlier age.  If you have a family history of colorectal cancer, talk with your health care provider about genetic screening.  Your health care provider may also recommend using home test kits to check for hidden blood in your stool.  A small camera at the end of a tube can be used to examine your colon directly (sigmoidoscopy or colonoscopy). This is done to check for the earliest forms of colorectal cancer.  Direct examination of the colon should be repeated every 5-10 years until age 31. However, if early forms of precancerous polyps or small growths are found or if you have a family history or genetic risk for colorectal cancer, you may need to be screened more often.  Skin Cancer  Check your skin from head to toe regularly.  Monitor any moles. Be sure to tell your health care provider: ? About any new moles or changes in moles, especially if there is a change in a mole's shape or color. ? If you have a mole that is larger than the size of a pencil eraser.  If any of your family members has a history of skin cancer, especially at a young age, talk with your health care provider about genetic screening.  Always use sunscreen. Apply sunscreen liberally and repeatedly throughout the day.  Whenever you are  outside, protect yourself by wearing long sleeves, pants, a wide-brimmed hat, and sunglasses.  What should I know about osteoporosis? Osteoporosis is a condition in which bone destruction happens more quickly than new bone creation. After menopause, you may be at an increased risk for osteoporosis. To help prevent osteoporosis or the bone fractures that can happen because of osteoporosis, the following is  recommended:  If you are 74-19 years old, get at least 1,000 mg of calcium and at least 600 mg of vitamin D per day.  If you are older than age 17 but younger than age 4, get at least 1,200 mg of calcium and at least 600 mg of vitamin D per day.  If you are older than age 55, get at least 1,200 mg of calcium and at least 800 mg of vitamin D per day.  Smoking and excessive alcohol intake increase the risk of osteoporosis. Eat foods that are rich in calcium and vitamin D, and do weight-bearing exercises several times each week as directed by your health care provider. What should I know about how menopause affects my mental health? Depression may occur at any age, but it is more common as you become older. Common symptoms of depression include:  Low or sad mood.  Changes in sleep patterns.  Changes in appetite or eating patterns.  Feeling an overall lack of motivation or enjoyment of activities that you previously enjoyed.  Frequent crying spells.  Talk with your health care provider if you think that you are experiencing depression. What should I know about immunizations? It is important that you get and maintain your immunizations. These include:  Tetanus, diphtheria, and pertussis (Tdap) booster vaccine.  Influenza every year before the flu season begins.  Pneumonia vaccine.  Shingles vaccine.  Your health care provider may also recommend other immunizations. This information is not intended to replace advice given to you by your health care provider. Make sure you discuss any questions you have with your health care provider. Document Released: 07/15/2005 Document Revised: 12/11/2015 Document Reviewed: 02/24/2015 Elsevier Interactive Patient Education  2018 [employer].

## 2017-03-17 NOTE — Telephone Encounter (Signed)
The patient was here for AWV.  States she will have her colonoscopy. States she may have rec'd a letter;  Report from 2012 request up in 5 years  Call to the [facility] at [phone] but busy at present. Will call back later

## 2017-03-20 ENCOUNTER — Ambulatory Visit (INDEPENDENT_AMBULATORY_CARE_PROVIDER_SITE_OTHER)
Admission: RE | Admit: 2017-03-20 | Discharge: 2017-03-20 | Disposition: A | Discharge status: Home / Self Care | Payer: Medicare HMO | Source: Ambulatory Visit | Attending: Family Medicine | Admitting: Family Medicine

## 2017-03-20 DIAGNOSIS — M79661 Pain in right lower leg: Secondary | ICD-10-CM

## 2017-03-20 DIAGNOSIS — M79604 Pain in right leg: Secondary | ICD-10-CM | POA: Diagnosis not present

## 2017-03-22 ENCOUNTER — Other Ambulatory Visit: Payer: Self-pay

## 2017-03-22 ENCOUNTER — Telehealth: Payer: Self-pay | Admitting: Family Medicine

## 2017-03-22 DIAGNOSIS — Z1211 Encounter for screening for malignant neoplasm of colon: Secondary | ICD-10-CM

## 2017-03-22 NOTE — Telephone Encounter (Signed)
I left a voice message for pt with normal xray results.

## 2017-03-22 NOTE — Telephone Encounter (Signed)
fup call to [facility] Stated this patient was behind on colonoscopy  Please send referral and they will pick it up  Will order amb referral as directed [place]

## 2017-03-22 NOTE — Telephone Encounter (Signed)
Pt returning your call about x ray results

## 2017-03-22 NOTE — Progress Notes (Unsigned)
Order for [facility] referral to fup on colonoscopy screening  [place] RN

## 2017-03-30 ENCOUNTER — Other Ambulatory Visit: Payer: Self-pay | Admitting: Pharmacy Technician

## 2017-03-30 DIAGNOSIS — Z01419 Encounter for gynecological examination (general) (routine) without abnormal findings: Secondary | ICD-10-CM | POA: Diagnosis not present

## 2017-03-30 DIAGNOSIS — Z6824 Body mass index (BMI) 24.0-24.9, adult: Secondary | ICD-10-CM | POA: Diagnosis not present

## 2017-03-30 DIAGNOSIS — Z1231 Encounter for screening mammogram for malignant neoplasm of breast: Secondary | ICD-10-CM | POA: Diagnosis not present

## 2017-03-30 NOTE — Patient Outreach (Signed)
[name]  03/30/2017  Christy Kirby [place]) 05-03-1951 [id number]   Contacted patient in regards to Atorvastatin 20mg  medication adherence. No answer, voicemail full.  [name], [name] [phone]

## 2017-04-10 ENCOUNTER — Encounter: Payer: Self-pay | Admitting: Internal Medicine

## 2017-04-18 DIAGNOSIS — M8588 Other specified disorders of bone density and structure, other site: Secondary | ICD-10-CM | POA: Diagnosis not present

## 2017-04-18 DIAGNOSIS — N958 Other specified menopausal and perimenopausal disorders: Secondary | ICD-10-CM | POA: Diagnosis not present

## 2017-06-13 ENCOUNTER — Encounter: Payer: Self-pay | Admitting: Family Medicine

## 2017-06-14 ENCOUNTER — Other Ambulatory Visit: Payer: Self-pay

## 2017-06-14 ENCOUNTER — Ambulatory Visit (AMBULATORY_SURGERY_CENTER): Payer: Self-pay | Admitting: *Deleted

## 2017-06-14 VITALS — Ht 65.5 in | Wt 144.0 lb

## 2017-06-14 DIAGNOSIS — Z86010 Personal history of colonic polyps: Secondary | ICD-10-CM

## 2017-06-14 MED ORDER — NA SULFATE-K SULFATE-MG SULF 17.5-3.13-1.6 GM/177ML PO SOLN
1.0000 | Freq: Once | ORAL | 0 refills | Status: AC
Start: 2017-06-14 — End: 2017-06-14

## 2017-06-14 NOTE — Progress Notes (Signed)
No egg or soy allergy known to patient  No issues with past sedation with any surgeries  or procedures, no intubation problems  No diet pills per patient No home 02 use per patient  No blood thinners per patient  Pt denies issues with constipation  No A fib or A flutter  EMMI video sent to pt's e mail  

## 2017-06-19 ENCOUNTER — Encounter: Payer: Self-pay | Admitting: Internal Medicine

## 2017-06-26 ENCOUNTER — Encounter: Payer: Medicare HMO | Admitting: Internal Medicine

## 2017-06-26 ENCOUNTER — Encounter: Payer: Self-pay | Admitting: Internal Medicine

## 2017-06-26 ENCOUNTER — Other Ambulatory Visit: Payer: Self-pay

## 2017-06-26 ENCOUNTER — Ambulatory Visit (AMBULATORY_SURGERY_CENTER): Payer: Medicare HMO | Admitting: Internal Medicine

## 2017-06-26 VITALS — BP 154/93 | HR 74 | Temp 97.3°F | Resp 17 | Ht 65.5 in | Wt 144.0 lb

## 2017-06-26 DIAGNOSIS — Z1211 Encounter for screening for malignant neoplasm of colon: Secondary | ICD-10-CM | POA: Diagnosis not present

## 2017-06-26 DIAGNOSIS — K635 Polyp of colon: Secondary | ICD-10-CM

## 2017-06-26 DIAGNOSIS — Z86010 Personal history of colonic polyps: Secondary | ICD-10-CM

## 2017-06-26 DIAGNOSIS — D122 Benign neoplasm of ascending colon: Secondary | ICD-10-CM

## 2017-06-26 HISTORY — PX: COLONOSCOPY: SHX174

## 2017-06-26 MED ORDER — SODIUM CHLORIDE 0.9 % IV SOLN: 500.0000 mL | Freq: Once | INTRAVENOUS | Status: DC

## 2017-06-26 NOTE — Op Note (Signed)
[name] [facility]) Procedure Date: 06/26/2017 10:32 AM MRN: [id number] Endoscopist: Docia Chuck. Henrene Pastor , MD Age: 67 Referring MD:  Date of Birth: 1951/04/03 Gender: Female Account #: [id number] Procedure:                Colonoscopy, with cold snare polypectomy x 1 Indications:              High risk colon cancer surveillance: Personal                            history of non-advanced adenoma. Index exam April                            2012 Medicines:                Monitored Anesthesia Care Procedure:                Pre-Anesthesia Assessment:                           - Prior to the procedure, a History and Physical                            was performed, and patient medications and                            allergies were reviewed. The patient's tolerance of                            previous anesthesia was also reviewed. The risks                            and benefits of the procedure and the sedation                            options and risks were discussed with the patient.                            All questions were answered, and informed consent                            was obtained. Prior Anticoagulants: The patient has                            taken no previous anticoagulant or antiplatelet                            agents. ASA Grade Assessment: II - A patient with                            mild systemic disease. After reviewing the risks                            and benefits, the patient was deemed in  satisfactory condition to undergo the procedure.                           After obtaining informed consent, the colonoscope                            was passed under direct vision. Throughout the                            procedure, the patient's blood pressure, pulse, and                            oxygen saturations were monitored continuously. The                            Model CF-HQ190L  [id number]) scope was introduced                            through the anus and advanced to the the cecum,                            identified by appendiceal orifice and ileocecal                            valve. The ileocecal valve, appendiceal orifice,                            and rectum were photographed. The quality of the                            bowel preparation was excellent. The colonoscopy                            was performed without difficulty. The patient                            tolerated the procedure well. The bowel preparation                            used was SUPREP. Scope In: 10:45:40 AM Scope Out: 10:56:20 AM Scope Withdrawal Time: 0 hours 8 minutes 35 seconds  Total Procedure Duration: 0 hours 10 minutes 40 seconds  Findings:                 A 2 mm polyp was found in the ascending colon. The                            polyp was removed with a cold snare. Resection and                            retrieval were complete.                           Internal hemorrhoids were found during retroflexion.  The exam was otherwise without abnormality on                            direct and retroflexion views. Complications:            No immediate complications. Estimated blood loss:                            None. Estimated Blood Loss:     Estimated blood loss: none. Impression:               - One 2 mm polyp in the ascending colon, removed                            with a cold snare. Resected and retrieved.                           - Internal hemorrhoids.                           - The examination was otherwise normal on direct                            and retroflexion views. Recommendation:           - Repeat colonoscopy in 5-10 years for surveillance.                           - Patient has a contact number available for                            emergencies. The signs and symptoms of potential                             delayed complications were discussed with the                            patient. Return to normal activities tomorrow.                            Written discharge instructions were provided to the                            patient.                           - Resume previous diet.                           - Continue present medications.                           - Await pathology results. Docia Chuck. Henrene Pastor, MD 06/26/2017 11:03:42 AM This report has been signed electronically.

## 2017-06-26 NOTE — Patient Instructions (Signed)
YOU HAD AN ENDOSCOPIC PROCEDURE TODAY AT THE [facility]:   Refer to the procedure report that was given to you for any specific questions about what was found during the examination.  If the procedure report does not answer your questions, please call your gastroenterologist to clarify.  If you requested that your care partner not be given the details of your procedure findings, then the procedure report has been included in a sealed envelope for you to review at your convenience later.  YOU SHOULD EXPECT: Some feelings of bloating in the abdomen. Passage of more gas than usual.  Walking can help get rid of the air that was put into your GI tract during the procedure and reduce the bloating. If you had a lower endoscopy (such as a colonoscopy or flexible sigmoidoscopy) you may notice spotting of blood in your stool or on the toilet paper. If you underwent a bowel prep for your procedure, you may not have a normal bowel movement for a few days.  Please Note:  You might notice some irritation and congestion in your nose or some drainage.  This is from the oxygen used during your procedure.  There is no need for concern and it should clear up in a day or so.  SYMPTOMS TO REPORT IMMEDIATELY:   Following lower endoscopy (colonoscopy or flexible sigmoidoscopy):  Excessive amounts of blood in the stool  Significant tenderness or worsening of abdominal pains  Swelling of the abdomen that is new, acute  Fever of 100F or higher  For urgent or emergent issues, a gastroenterologist can be reached at any hour by calling [phone].   DIET:  We do recommend a small meal at first, but then you may proceed to your regular diet.  Drink plenty of fluids but you should avoid alcoholic beverages for 24 hours.  ACTIVITY:  You should plan to take it easy for the rest of today and you should NOT DRIVE or use heavy machinery until tomorrow (because of the sedation medicines used during the test).     FOLLOW UP: Our staff will call the number listed on your records the next business day following your procedure to check on you and address any questions or concerns that you may have regarding the information given to you following your procedure. If we do not reach you, we will leave a message.  However, if you are feeling well and you are not experiencing any problems, there is no need to return our call.  We will assume that you have returned to your regular daily activities without incident.  If any biopsies were taken you will be contacted by phone or by letter within the next 1-3 weeks.  Please call us at [phone] if you have not heard about the biopsies in 3 weeks.   Await for biopsy results to determine next repeat Colonoscopy screening Polyps (handout given) Hemorrhoids (handout given)   SIGNATURES/CONFIDENTIALITY: You and/or your care partner have signed paperwork which will be entered into your electronic medical record.  These signatures attest to the fact that that the information above on your After Visit Summary has been reviewed and is understood.  Full responsibility of the confidentiality of this discharge information lies with you and/or your care-partner. 

## 2017-06-26 NOTE — Progress Notes (Signed)
Called to room to assist during endoscopic procedure.  Patient ID and intended procedure confirmed with present staff. Received instructions for my participation in the procedure from the performing physician.  

## 2017-06-26 NOTE — Progress Notes (Signed)
A and O x3. Report to RN. Tolerated MAC anesthesia well.

## 2017-06-26 NOTE — Progress Notes (Signed)
Pt's states no medical or surgical changes since previsit or office visit. 

## 2017-06-27 ENCOUNTER — Telehealth: Payer: Self-pay

## 2017-06-27 NOTE — Telephone Encounter (Signed)
No message left, voice mail full

## 2017-06-27 NOTE — Telephone Encounter (Signed)
Attempted to reach patient for post-procedure f/u call. No answer. Not able to leave message as the mailbox was full. Will attempt to reach patient again later today.

## 2017-06-30 ENCOUNTER — Encounter: Payer: Self-pay | Admitting: Internal Medicine

## 2017-08-29 ENCOUNTER — Telehealth: Payer: Self-pay | Admitting: Family Medicine

## 2017-08-29 DIAGNOSIS — M1611 Unilateral primary osteoarthritis, right hip: Secondary | ICD-10-CM | POA: Diagnosis not present

## 2017-08-29 DIAGNOSIS — M25551 Pain in right hip: Secondary | ICD-10-CM | POA: Diagnosis not present

## 2017-08-29 NOTE — Telephone Encounter (Signed)
Placed in Dr. Barbie Banner red folder

## 2017-08-29 NOTE — Telephone Encounter (Signed)
Preoperative Clearance form to be filled out.  Placed in dr's folder.  Upon completion, fax to: [phone], Attn: Santiago Bur.  Also call patient for pick up at [phone].

## 2017-08-30 NOTE — Telephone Encounter (Signed)
This was done.

## 2017-09-20 NOTE — Telephone Encounter (Signed)
Form was originally faxed on 08/30/2017, I did refax today and have a confirmation that it did go through, spoke with pt and advised her to contact center to make sure they received it, pt agreed.

## 2017-09-20 NOTE — Telephone Encounter (Signed)
Pt calling and states that [facility] has not received the fax for the preoperative clearance form. Please refax to [phone] Attn: Santiago Bur. Please contact pt to let her know once the forms are faxed.

## 2017-10-21 ENCOUNTER — Other Ambulatory Visit: Payer: Self-pay | Admitting: Family Medicine

## 2017-12-15 ENCOUNTER — Ambulatory Visit: Payer: Self-pay | Admitting: Orthopedic Surgery

## 2017-12-26 ENCOUNTER — Ambulatory Visit: Payer: Self-pay | Admitting: Orthopedic Surgery

## 2017-12-26 NOTE — H&P (Signed)
TOTAL HIP ADMISSION H&P  Patient is admitted for right total hip arthroplasty.  Subjective:  Chief Complaint: right hip pain  HPI: Christy Kirby, 67 y.o. female, has a history of pain and functional disability in the right hip(s) due to arthritis and patient has failed non-surgical conservative treatments for greater than 12 weeks to include NSAID's and/or analgesics, flexibility and strengthening excercises, use of assistive devices, weight reduction as appropriate and activity modification.  Onset of symptoms was gradual starting 5 years ago with gradually worsening course since that time.The patient noted no past surgery on the right hip(s).  Patient currently rates pain in the right hip at 10 out of 10 with activity. Patient has night pain, worsening of pain with activity and weight bearing, trendelenberg gait, pain that interfers with activities of daily living, pain with passive range of motion and joint swelling. Patient has evidence of subchondral cysts, subchondral sclerosis, periarticular osteophytes and joint space narrowing by imaging studies. This condition presents safety issues increasing the risk of falls.   There is no current active infection.  Patient Active Problem List   Diagnosis Date Noted  . Elevated BP without diagnosis of hypertension 02/08/2017  . Hyperlipemia, mixed 02/08/2017  . HIP PAIN, BILATERAL 04/07/2009  . BACK PAIN, LUMBAR 04/07/2009  . GERD 03/19/2007   Past Medical History:  Diagnosis Date  . Arthritis    hip  . GERD (gastroesophageal reflux disease)   . Hyperlipidemia     Past Surgical History:  Procedure Laterality Date  . COLONOSCOPY  09-07-10   per Dr. Henrene Pastor, benign polyps, repeat in 5 yrs   . NEUROPLASTY / TRANSPOSITION MEDIAN NERVE AT CARPAL TUNNEL BILATERAL    . POLYPECTOMY      Current Outpatient Medications  Medication Sig Dispense Refill Last Dose  . atorvastatin (LIPITOR) 20 MG tablet Take 1 tablet (20 mg total) by mouth daily.  (Patient not taking: Reported on 03/17/2017) 30 tablet 11 Not Taking  . ibuprofen (ADVIL,MOTRIN) 800 MG tablet TAKE 1 TABLET (800 MG TOTAL) BY MOUTH EVERY 6 (SIX) HOURS AS NEEDED FOR FEVER OR MODERATE PAIN. 60 tablet 4    Current Facility-Administered Medications  Medication Dose Route Frequency Provider Last Rate Last Dose  . 0.9 %  sodium chloride infusion  500 mL Intravenous Once Irene Shipper, MD       Allergies  Allergen Reactions  . Codeine Nausea Only    Social History   Tobacco Use  . Smoking status: Former Research scientist (life sciences)  . Smokeless tobacco: Never Used  . Tobacco comment: only when 16 briefly   Substance Use Topics  . Alcohol use: Yes    Alcohol/week: 0.0 oz    Comment: occ wine     Family History  Problem Relation Age of Onset  . Diabetes Sister   . Colon cancer Neg Hx   . Colon polyps Neg Hx   . Esophageal cancer Neg Hx   . Rectal cancer Neg Hx   . Stomach cancer Neg Hx      Review of Systems  Constitutional: Negative.   HENT: Negative.   Eyes: Negative.   Respiratory: Negative.   Cardiovascular: Negative.   Gastrointestinal: Negative.   Genitourinary: Negative.   Musculoskeletal: Positive for back pain and joint pain.  Skin: Negative.   Neurological: Negative.   Endo/Heme/Allergies: Negative.   Psychiatric/Behavioral: Negative.     Objective:  Physical Exam  Vitals reviewed. Constitutional: She is oriented to person, place, and time. She appears well-developed and well-nourished.  HENT:  Head: Normocephalic and atraumatic.  Eyes: Pupils are equal, round, and reactive to light. Conjunctivae and EOM are normal.  Neck: Normal range of motion. No thyromegaly present.  Cardiovascular: Normal rate and intact distal pulses.  Respiratory: Effort normal. No respiratory distress.  GI: She exhibits no distension.  Genitourinary:  Genitourinary Comments: deferred  Musculoskeletal:       Right hip: She exhibits decreased range of motion, decreased strength and  bony tenderness.  Neurological: She is alert and oriented to person, place, and time. She has normal reflexes.  Skin: Skin is warm and dry.  Psychiatric: She has a normal mood and affect. Her behavior is normal. Judgment and thought content normal.    Vital signs in last 24 hours: @VSRANGES @  Labs:   Estimated body mass index is 23.6 kg/m as calculated from the following:   Height as of 06/26/17: 5' 5.5" (1.664 m).   Weight as of 06/26/17: 65.3 kg (144 lb).   Imaging Review Plain radiographs demonstrate severe degenerative joint disease of the right hip(s). The bone quality appears to be adequate for age and reported activity level.    Preoperative templating of the joint replacement has been completed, documented, and submitted to the Operating Room personnel in order to optimize intra-operative equipment management.     Assessment/Plan:  End stage arthritis, right hip(s)  The patient history, physical examination, clinical judgement of the provider and imaging studies are consistent with end stage degenerative joint disease of the right hip(s) and total hip arthroplasty is deemed medically necessary. The treatment options including medical management, injection therapy, arthroscopy and arthroplasty were discussed at length. The risks and benefits of total hip arthroplasty were presented and reviewed. The risks due to aseptic loosening, infection, stiffness, dislocation/subluxation,  thromboembolic complications and other imponderables were discussed.  The patient acknowledged the explanation, agreed to proceed with the plan and consent was signed. Patient is being admitted for inpatient treatment for surgery, pain control, PT, OT, prophylactic antibiotics, VTE prophylaxis, progressive ambulation and ADL's and discharge planning.The patient is planning to be discharged home with HEP. Rx for DME given.

## 2017-12-26 NOTE — H&P (View-Only) (Signed)
TOTAL HIP ADMISSION H&P  Patient is admitted for right total hip arthroplasty.  Subjective:  Chief Complaint: right hip pain  HPI: Christy Kirby, 67 y.o. female, has a history of pain and functional disability in the right hip(s) due to arthritis and patient has failed non-surgical conservative treatments for greater than 12 weeks to include NSAID's and/or analgesics, flexibility and strengthening excercises, use of assistive devices, weight reduction as appropriate and activity modification.  Onset of symptoms was gradual starting 5 years ago with gradually worsening course since that time.The patient noted no past surgery on the right hip(s).  Patient currently rates pain in the right hip at 10 out of 10 with activity. Patient has night pain, worsening of pain with activity and weight bearing, trendelenberg gait, pain that interfers with activities of daily living, pain with passive range of motion and joint swelling. Patient has evidence of subchondral cysts, subchondral sclerosis, periarticular osteophytes and joint space narrowing by imaging studies. This condition presents safety issues increasing the risk of falls.   There is no current active infection.  Patient Active Problem List   Diagnosis Date Noted  . Elevated BP without diagnosis of hypertension 02/08/2017  . Hyperlipemia, mixed 02/08/2017  . HIP PAIN, BILATERAL 04/07/2009  . BACK PAIN, LUMBAR 04/07/2009  . GERD 03/19/2007   Past Medical History:  Diagnosis Date  . Arthritis    hip  . GERD (gastroesophageal reflux disease)   . Hyperlipidemia     Past Surgical History:  Procedure Laterality Date  . COLONOSCOPY  09-07-10   per Dr. Henrene Pastor, benign polyps, repeat in 5 yrs   . NEUROPLASTY / TRANSPOSITION MEDIAN NERVE AT CARPAL TUNNEL BILATERAL    . POLYPECTOMY      Current Outpatient Medications  Medication Sig Dispense Refill Last Dose  . atorvastatin (LIPITOR) 20 MG tablet Take 1 tablet (20 mg total) by mouth daily.  (Patient not taking: Reported on 03/17/2017) 30 tablet 11 Not Taking  . ibuprofen (ADVIL,MOTRIN) 800 MG tablet TAKE 1 TABLET (800 MG TOTAL) BY MOUTH EVERY 6 (SIX) HOURS AS NEEDED FOR FEVER OR MODERATE PAIN. 60 tablet 4    Current Facility-Administered Medications  Medication Dose Route Frequency Provider Last Rate Last Dose  . 0.9 %  sodium chloride infusion  500 mL Intravenous Once Irene Shipper, MD       Allergies  Allergen Reactions  . Codeine Nausea Only    Social History   Tobacco Use  . Smoking status: Former Research scientist (life sciences)  . Smokeless tobacco: Never Used  . Tobacco comment: only when 16 briefly   Substance Use Topics  . Alcohol use: Yes    Alcohol/week: 0.0 oz    Comment: occ wine     Family History  Problem Relation Age of Onset  . Diabetes Sister   . Colon cancer Neg Hx   . Colon polyps Neg Hx   . Esophageal cancer Neg Hx   . Rectal cancer Neg Hx   . Stomach cancer Neg Hx      Review of Systems  Constitutional: Negative.   HENT: Negative.   Eyes: Negative.   Respiratory: Negative.   Cardiovascular: Negative.   Gastrointestinal: Negative.   Genitourinary: Negative.   Musculoskeletal: Positive for back pain and joint pain.  Skin: Negative.   Neurological: Negative.   Endo/Heme/Allergies: Negative.   Psychiatric/Behavioral: Negative.     Objective:  Physical Exam  Vitals reviewed. Constitutional: She is oriented to person, place, and time. She appears well-developed and well-nourished.  HENT:  Head: Normocephalic and atraumatic.  Eyes: Pupils are equal, round, and reactive to light. Conjunctivae and EOM are normal.  Neck: Normal range of motion. No thyromegaly present.  Cardiovascular: Normal rate and intact distal pulses.  Respiratory: Effort normal. No respiratory distress.  GI: She exhibits no distension.  Genitourinary:  Genitourinary Comments: deferred  Musculoskeletal:       Right hip: She exhibits decreased range of motion, decreased strength and  bony tenderness.  Neurological: She is alert and oriented to person, place, and time. She has normal reflexes.  Skin: Skin is warm and dry.  Psychiatric: She has a normal mood and affect. Her behavior is normal. Judgment and thought content normal.    Vital signs in last 24 hours: @VSRANGES @  Labs:   Estimated body mass index is 23.6 kg/m as calculated from the following:   Height as of 06/26/17: 5' 5.5" (1.664 m).   Weight as of 06/26/17: 65.3 kg (144 lb).   Imaging Review Plain radiographs demonstrate severe degenerative joint disease of the right hip(s). The bone quality appears to be adequate for age and reported activity level.    Preoperative templating of the joint replacement has been completed, documented, and submitted to the Operating Room personnel in order to optimize intra-operative equipment management.     Assessment/Plan:  End stage arthritis, right hip(s)  The patient history, physical examination, clinical judgement of the provider and imaging studies are consistent with end stage degenerative joint disease of the right hip(s) and total hip arthroplasty is deemed medically necessary. The treatment options including medical management, injection therapy, arthroscopy and arthroplasty were discussed at length. The risks and benefits of total hip arthroplasty were presented and reviewed. The risks due to aseptic loosening, infection, stiffness, dislocation/subluxation,  thromboembolic complications and other imponderables were discussed.  The patient acknowledged the explanation, agreed to proceed with the plan and consent was signed. Patient is being admitted for inpatient treatment for surgery, pain control, PT, OT, prophylactic antibiotics, VTE prophylaxis, progressive ambulation and ADL's and discharge planning.The patient is planning to be discharged home with HEP. Rx for DME given.

## 2018-01-01 ENCOUNTER — Other Ambulatory Visit (HOSPITAL_COMMUNITY): Payer: Self-pay | Admitting: *Deleted

## 2018-01-01 NOTE — Patient Instructions (Signed)
Christy Kirby  01/01/2018   Your procedure is scheduled on: 01-11-18  Report to [facility] Main  Entrance  Report to admitting at  800 AM    Call this number if you have problems the morning of surgery [phone]   Remember: Do not eat food or drink liquids :After Midnight.     Take these medicines the morning of surgery with A SIP OF WATER:  DO NOT TAKE ANY DIABETIC MEDICATIONS DAY OF YOUR SURGERY                               You may not have any metal on your body including hair pins and              piercings  Do not wear jewelry, make-up, lotions, powders or perfumes, deodorant             Do not wear nail polish.  Do not shave  48 hours prior to surgery.              Men may shave face and neck.   Do not bring valuables to the hospital. [place].  Contacts, dentures or bridgework may not be worn into surgery.  Leave suitcase in the car. After surgery it may be brought to your room.     Patients discharged the day of surgery will not be allowed to drive home.  Name and phone number of your driver:  Special Instructions: N/A              Please read over the following fact sheets you were given: _____________________________________________________________________             [facility] - Preparing for Surgery Before surgery, you can play an important role.  Because skin is not sterile, your skin needs to be as free of germs as possible.  You can reduce the number of germs on your skin by washing with CHG (chlorahexidine gluconate) soap before surgery.  CHG is an antiseptic cleaner which kills germs and bonds with the skin to continue killing germs even after washing. Please DO NOT use if you have an allergy to CHG or antibacterial soaps.  If your skin becomes reddened/irritated stop using the CHG and inform your nurse when you arrive at Short Stay. Do not shave (including legs and underarms) for  at least 48 hours prior to the first CHG shower.  You may shave your face/neck. Please follow these instructions carefully:  1.  Shower with CHG Soap the night before surgery and the  morning of Surgery.  2.  If you choose to wash your hair, wash your hair first as usual with your  normal  shampoo.  3.  After you shampoo, rinse your hair and body thoroughly to remove the  shampoo.                           4.  Use CHG as you would any other liquid soap.  You can apply chg directly  to the skin and wash                       Gently with a scrungie  or clean washcloth.  5.  Apply the CHG Soap to your body ONLY FROM THE NECK DOWN.   Do not use on face/ open                           Wound or open sores. Avoid contact with eyes, ears mouth and genitals (private parts).                       Wash face,  Genitals (private parts) with your normal soap.             6.  Wash thoroughly, paying special attention to the area where your surgery  will be performed.  7.  Thoroughly rinse your body with warm water from the neck down.  8.  DO NOT shower/wash with your normal soap after using and rinsing off  the CHG Soap.                9.  Pat yourself dry with a clean towel.            10.  Wear clean pajamas.            11.  Place clean sheets on your bed the night of your first shower and do not  sleep with pets. Day of Surgery : Do not apply any lotions/deodorants the morning of surgery.  Please wear clean clothes to the hospital/surgery center.  FAILURE TO FOLLOW THESE INSTRUCTIONS MAY RESULT IN THE CANCELLATION OF YOUR SURGERY PATIENT SIGNATURE_________________________________  NURSE SIGNATURE__________________________________  ________________________________________________________________________   Adam Phenix  An incentive spirometer is a tool that can help keep your lungs clear and active. This tool measures how well you are filling your lungs with each breath. Taking long deep  breaths may help reverse or decrease the chance of developing breathing (pulmonary) problems (especially infection) following:  A long period of time when you are unable to move or be active. BEFORE THE PROCEDURE   If the spirometer includes an indicator to show your best effort, your nurse or respiratory therapist will set it to a desired goal.  If possible, sit up straight or lean slightly forward. Try not to slouch.  Hold the incentive spirometer in an upright position. INSTRUCTIONS FOR USE  1. Sit on the edge of your bed if possible, or sit up as far as you can in bed or on a chair. 2. Hold the incentive spirometer in an upright position. 3. Breathe out normally. 4. Place the mouthpiece in your mouth and seal your lips tightly around it. 5. Breathe in slowly and as deeply as possible, raising the piston or the ball toward the top of the column. 6. Hold your breath for 3-5 seconds or for as long as possible. Allow the piston or ball to fall to the bottom of the column. 7. Remove the mouthpiece from your mouth and breathe out normally. 8. Rest for a few seconds and repeat Steps 1 through 7 at least 10 times every 1-2 hours when you are awake. Take your time and take a few normal breaths between deep breaths. 9. The spirometer may include an indicator to show your best effort. Use the indicator as a goal to work toward during each repetition. 10. After each set of 10 deep breaths, practice coughing to be sure your lungs are clear. If you have an incision (the cut made at the time of surgery), support your incision when  coughing by placing a pillow or rolled up towels firmly against it. Once you are able to get out of bed, walk around indoors and cough well. You may stop using the incentive spirometer when instructed by your caregiver.  RISKS AND COMPLICATIONS  Take your time so you do not get dizzy or light-headed.  If you are in pain, you may need to take or ask for pain medication before  doing incentive spirometry. It is harder to take a deep breath if you are having pain. AFTER USE  Rest and breathe slowly and easily.  It can be helpful to keep track of a log of your progress. Your caregiver can provide you with a simple table to help with this. If you are using the spirometer at home, follow these instructions: [place] IF:   You are having difficultly using the spirometer.  You have trouble using the spirometer as often as instructed.  Your pain medication is not giving enough relief while using the spirometer.  You develop fever of 100.5 F (38.1 C) or higher. SEEK IMMEDIATE MEDICAL CARE IF:   You cough up bloody sputum that had not been present before.  You develop fever of 102 F (38.9 C) or greater.  You develop worsening pain at or near the incision site. MAKE SURE YOU:   Understand these instructions.  Will watch your condition.  Will get help right away if you are not doing well or get worse. Document Released: 10/03/2006 Document Revised: 08/15/2011 Document Reviewed: 12/04/2006 ExitCare Patient Information 2014 ExitCare, [place].   ________________________________________________________________________  WHAT IS A BLOOD TRANSFUSION? Blood Transfusion Information  A transfusion is the replacement of blood or some of its parts. Blood is made up of multiple cells which provide different functions.  Red blood cells carry oxygen and are used for blood loss replacement.  White blood cells fight against infection.  Platelets control bleeding.  Plasma helps clot blood.  Other blood products are available for specialized needs, such as hemophilia or other clotting disorders. BEFORE THE TRANSFUSION  Who gives blood for transfusions?   Healthy volunteers who are fully evaluated to make sure their blood is safe. This is blood bank blood. Transfusion therapy is the safest it has ever been in the practice of medicine. Before blood is taken  from a donor, a complete history is taken to make sure that person has no history of diseases nor engages in risky social behavior (examples are intravenous drug use or sexual activity with multiple partners). The donor's travel history is screened to minimize risk of transmitting infections, such as malaria. The donated blood is tested for signs of infectious diseases, such as HIV and hepatitis. The blood is then tested to be sure it is compatible with you in order to minimize the chance of a transfusion reaction. If you or a relative donates blood, this is often done in anticipation of surgery and is not appropriate for emergency situations. It takes many days to process the donated blood. RISKS AND COMPLICATIONS Although transfusion therapy is very safe and saves many lives, the main dangers of transfusion include:   Getting an infectious disease.  Developing a transfusion reaction. This is an allergic reaction to something in the blood you were given. Every precaution is taken to prevent this. The decision to have a blood transfusion has been considered carefully by your caregiver before blood is given. Blood is not given unless the benefits outweigh the risks. AFTER THE TRANSFUSION  Right after receiving  a blood transfusion, you will usually feel much better and more energetic. This is especially true if your red blood cells have gotten low (anemic). The transfusion raises the level of the red blood cells which carry oxygen, and this usually causes an energy increase.  The nurse administering the transfusion will monitor you carefully for complications. HOME CARE INSTRUCTIONS  No special instructions are needed after a transfusion. You may find your energy is better. Speak with your caregiver about any limitations on activity for underlying diseases you may have. SEEK MEDICAL CARE IF:   Your condition is not improving after your transfusion.  You develop redness or irritation at the  intravenous (IV) site. SEEK IMMEDIATE MEDICAL CARE IF:  Any of the following symptoms occur over the next 12 hours:  Shaking chills.  You have a temperature by mouth above 102 F (38.9 C), not controlled by medicine.  Chest, back, or muscle pain.  People around you feel you are not acting correctly or are confused.  Shortness of breath or difficulty breathing.  Dizziness and fainting.  You get a rash or develop hives.  You have a decrease in urine output.  Your urine turns a dark color or changes to pink, red, or brown. Any of the following symptoms occur over the next 10 days:  You have a temperature by mouth above 102 F (38.9 C), not controlled by medicine.  Shortness of breath.  Weakness after normal activity.  The white part of the eye turns yellow (jaundice).  You have a decrease in the amount of urine or are urinating less often.  Your urine turns a dark color or changes to pink, red, or brown. Document Released: 05/20/2000 Document Revised: 08/15/2011 Document Reviewed: 01/07/2008 [facility] Patient Information 2014 [place], [place].  _______________________________________________________________________

## 2018-01-02 ENCOUNTER — Encounter (HOSPITAL_COMMUNITY): Payer: Self-pay

## 2018-01-02 ENCOUNTER — Encounter (HOSPITAL_COMMUNITY)
Admission: RE | Admit: 2018-01-02 | Discharge: 2018-01-02 | Disposition: A | Discharge status: Home / Self Care | Payer: Medicare HMO | Source: Ambulatory Visit | Attending: Orthopedic Surgery | Admitting: Orthopedic Surgery

## 2018-01-02 ENCOUNTER — Other Ambulatory Visit: Payer: Self-pay

## 2018-01-02 DIAGNOSIS — M1611 Unilateral primary osteoarthritis, right hip: Secondary | ICD-10-CM | POA: Insufficient documentation

## 2018-01-02 DIAGNOSIS — Z01818 Encounter for other preprocedural examination: Secondary | ICD-10-CM | POA: Diagnosis not present

## 2018-01-02 LAB — CBC
HEMATOCRIT: 38.2 % (ref 36.0–46.0)
HEMOGLOBIN: 12.5 g/dL (ref 12.0–15.0)
MCH: 30.1 pg (ref 26.0–34.0)
MCHC: 32.7 g/dL (ref 30.0–36.0)
MCV: 92 fL (ref 78.0–100.0)
Platelets: 265 10*3/uL (ref 150–400)
RBC: 4.15 MIL/uL (ref 3.87–5.11)
RDW: 12.7 % (ref 11.5–15.5)
WBC: 3.9 10*3/uL — AB (ref 4.0–10.5)

## 2018-01-02 LAB — SURGICAL PCR SCREEN
MRSA, PCR: NEGATIVE
STAPHYLOCOCCUS AUREUS: NEGATIVE

## 2018-01-02 LAB — ABO/RH: ABO/RH(D): O POS

## 2018-01-11 ENCOUNTER — Inpatient Hospital Stay (HOSPITAL_COMMUNITY)
Admission: RE | Admit: 2018-01-11 | Discharge: 2018-01-12 | DRG: 470 | Disposition: A | Discharge status: Home / Self Care | Payer: Medicare HMO | Attending: Orthopedic Surgery | Admitting: Orthopedic Surgery

## 2018-01-11 ENCOUNTER — Inpatient Hospital Stay (HOSPITAL_COMMUNITY): Payer: Medicare HMO

## 2018-01-11 ENCOUNTER — Encounter (HOSPITAL_COMMUNITY): Payer: Self-pay | Admitting: *Deleted

## 2018-01-11 ENCOUNTER — Inpatient Hospital Stay (HOSPITAL_COMMUNITY): Payer: Medicare HMO | Admitting: Anesthesiology

## 2018-01-11 ENCOUNTER — Encounter (HOSPITAL_COMMUNITY)
Admission: RE | Disposition: A | Discharge status: Home / Self Care | Payer: Self-pay | Source: Home / Self Care | Attending: Orthopedic Surgery

## 2018-01-11 ENCOUNTER — Other Ambulatory Visit: Payer: Self-pay

## 2018-01-11 DIAGNOSIS — K219 Gastro-esophageal reflux disease without esophagitis: Secondary | ICD-10-CM | POA: Diagnosis present

## 2018-01-11 DIAGNOSIS — Z419 Encounter for procedure for purposes other than remedying health state, unspecified: Secondary | ICD-10-CM

## 2018-01-11 DIAGNOSIS — Z87891 Personal history of nicotine dependence: Secondary | ICD-10-CM

## 2018-01-11 DIAGNOSIS — Z885 Allergy status to narcotic agent status: Secondary | ICD-10-CM

## 2018-01-11 DIAGNOSIS — Z09 Encounter for follow-up examination after completed treatment for conditions other than malignant neoplasm: Secondary | ICD-10-CM

## 2018-01-11 DIAGNOSIS — M1611 Unilateral primary osteoarthritis, right hip: Secondary | ICD-10-CM | POA: Diagnosis not present

## 2018-01-11 DIAGNOSIS — Z471 Aftercare following joint replacement surgery: Secondary | ICD-10-CM | POA: Diagnosis not present

## 2018-01-11 DIAGNOSIS — Z96641 Presence of right artificial hip joint: Secondary | ICD-10-CM | POA: Diagnosis not present

## 2018-01-11 HISTORY — PX: TOTAL HIP ARTHROPLASTY: SHX124

## 2018-01-11 LAB — TYPE AND SCREEN
ABO/RH(D): O POS
ANTIBODY SCREEN: NEGATIVE

## 2018-01-11 SURGERY — ARTHROPLASTY, HIP, TOTAL, ANTERIOR APPROACH
Anesthesia: Spinal | Site: Hip | Laterality: Right

## 2018-01-11 MED ORDER — HYDRALAZINE HCL 20 MG/ML IJ SOLN
10.0000 mg | Freq: Once | INTRAMUSCULAR | Status: AC
  Administered 2018-01-11: 10 mg via INTRAVENOUS

## 2018-01-11 MED ORDER — SODIUM CHLORIDE 0.9 % IV SOLN: INTRAVENOUS | Status: DC

## 2018-01-11 MED ORDER — CEFAZOLIN SODIUM-DEXTROSE 2-4 GM/100ML-% IV SOLN
2.0000 g | INTRAVENOUS | Status: AC
  Administered 2018-01-11: 2 g via INTRAVENOUS
  Filled 2018-01-11: qty 100

## 2018-01-11 MED ORDER — ONDANSETRON HCL 4 MG/2ML IJ SOLN
INTRAMUSCULAR | Status: DC | PRN
  Administered 2018-01-11: 4 mg via INTRAVENOUS

## 2018-01-11 MED ORDER — CHLORHEXIDINE GLUCONATE 4 % EX LIQD: 60.0000 mL | Freq: Once | CUTANEOUS | Status: DC

## 2018-01-11 MED ORDER — FENTANYL CITRATE (PF) 100 MCG/2ML IJ SOLN
25.0000 ug | INTRAMUSCULAR | Status: DC | PRN
  Administered 2018-01-11: 50 ug via INTRAVENOUS

## 2018-01-11 MED ORDER — ISOPROPYL ALCOHOL 70 % SOLN
Status: DC | PRN
  Administered 2018-01-11: 1 via TOPICAL

## 2018-01-11 MED ORDER — ACETAMINOPHEN 10 MG/ML IV SOLN
1000.0000 mg | INTRAVENOUS | Status: AC
  Administered 2018-01-11: 1000 mg via INTRAVENOUS
  Filled 2018-01-11: qty 100

## 2018-01-11 MED ORDER — SODIUM CHLORIDE 0.9 % IV SOLN
INTRAVENOUS | Status: DC
  Administered 2018-01-11 – 2018-01-12 (×3): via INTRAVENOUS

## 2018-01-11 MED ORDER — DEXAMETHASONE SODIUM PHOSPHATE 10 MG/ML IJ SOLN
INTRAMUSCULAR | Status: DC | PRN
  Administered 2018-01-11: 10 mg via INTRAVENOUS

## 2018-01-11 MED ORDER — SODIUM CHLORIDE 0.9 % IR SOLN
Status: DC | PRN
  Administered 2018-01-11: 4000 mL

## 2018-01-11 MED ORDER — ALUM & MAG HYDROXIDE-SIMETH 200-200-20 MG/5ML PO SUSP: 30.0000 mL | ORAL | Status: DC | PRN

## 2018-01-11 MED ORDER — SODIUM CHLORIDE 0.9 % IJ SOLN
INTRAMUSCULAR | Status: DC | PRN
  Administered 2018-01-11: 29 mL

## 2018-01-11 MED ORDER — POVIDONE-IODINE 10 % EX SWAB
2.0000 "application " | Freq: Once | CUTANEOUS | Status: AC
  Administered 2018-01-11: 2 via TOPICAL

## 2018-01-11 MED ORDER — METHOCARBAMOL 500 MG PO TABS: 500.0000 mg | ORAL_TABLET | Freq: Four times a day (QID) | ORAL | Status: DC | PRN

## 2018-01-11 MED ORDER — FENTANYL CITRATE (PF) 100 MCG/2ML IJ SOLN
INTRAMUSCULAR | Status: DC | PRN
  Administered 2018-01-11 (×2): 50 ug via INTRAVENOUS

## 2018-01-11 MED ORDER — FENTANYL CITRATE (PF) 100 MCG/2ML IJ SOLN
INTRAMUSCULAR | Status: AC
  Filled 2018-01-11: qty 2

## 2018-01-11 MED ORDER — BUPIVACAINE-EPINEPHRINE (PF) 0.25% -1:200000 IJ SOLN
INTRAMUSCULAR | Status: AC
  Filled 2018-01-11: qty 30

## 2018-01-11 MED ORDER — SODIUM CHLORIDE 0.9 % IJ SOLN
INTRAMUSCULAR | Status: AC
  Filled 2018-01-11: qty 50

## 2018-01-11 MED ORDER — PROPOFOL 10 MG/ML IV BOLUS
INTRAVENOUS | Status: AC
  Filled 2018-01-11: qty 60

## 2018-01-11 MED ORDER — MENTHOL 3 MG MT LOZG: 1.0000 | LOZENGE | OROMUCOSAL | Status: DC | PRN

## 2018-01-11 MED ORDER — SENNA 8.6 MG PO TABS
1.0000 | ORAL_TABLET | Freq: Two times a day (BID) | ORAL | Status: DC
  Administered 2018-01-11 – 2018-01-12 (×2): 8.6 mg via ORAL
  Filled 2018-01-11 (×2): qty 1

## 2018-01-11 MED ORDER — HYDROCODONE-ACETAMINOPHEN 5-325 MG PO TABS
1.0000 | ORAL_TABLET | ORAL | Status: DC | PRN
  Administered 2018-01-11 – 2018-01-12 (×3): 2 via ORAL
  Filled 2018-01-11 (×3): qty 2

## 2018-01-11 MED ORDER — PHENYLEPHRINE 40 MCG/ML (10ML) SYRINGE FOR IV PUSH (FOR BLOOD PRESSURE SUPPORT)
PREFILLED_SYRINGE | INTRAVENOUS | Status: AC
  Filled 2018-01-11: qty 10

## 2018-01-11 MED ORDER — ONDANSETRON HCL 4 MG/2ML IJ SOLN: 4.0000 mg | Freq: Once | INTRAMUSCULAR | Status: DC | PRN

## 2018-01-11 MED ORDER — BUPIVACAINE IN DEXTROSE 0.75-8.25 % IT SOLN
INTRATHECAL | Status: DC | PRN
  Administered 2018-01-11: 15 mg via INTRATHECAL

## 2018-01-11 MED ORDER — KETOROLAC TROMETHAMINE 30 MG/ML IJ SOLN
INTRAMUSCULAR | Status: DC | PRN
Start: 2018-01-11 — End: 2018-01-11
  Administered 2018-01-11: 30 mg

## 2018-01-11 MED ORDER — BUPIVACAINE-EPINEPHRINE 0.25% -1:200000 IJ SOLN
INTRAMUSCULAR | Status: DC | PRN
  Administered 2018-01-11: 30 mL

## 2018-01-11 MED ORDER — METOCLOPRAMIDE HCL 5 MG/ML IJ SOLN: 5.0000 mg | Freq: Three times a day (TID) | INTRAMUSCULAR | Status: DC | PRN

## 2018-01-11 MED ORDER — ASPIRIN 81 MG PO CHEW
81.0000 mg | CHEWABLE_TABLET | Freq: Two times a day (BID) | ORAL | Status: DC
  Administered 2018-01-11 – 2018-01-12 (×2): 81 mg via ORAL
  Filled 2018-01-11 (×2): qty 1

## 2018-01-11 MED ORDER — DEXAMETHASONE SODIUM PHOSPHATE 10 MG/ML IJ SOLN
10.0000 mg | Freq: Once | INTRAMUSCULAR | Status: AC
  Administered 2018-01-12: 10 mg via INTRAVENOUS
  Filled 2018-01-11: qty 1

## 2018-01-11 MED ORDER — METHOCARBAMOL 500 MG IVPB - SIMPLE MED
INTRAVENOUS | Status: AC
  Filled 2018-01-11: qty 50

## 2018-01-11 MED ORDER — KETOROLAC TROMETHAMINE 15 MG/ML IJ SOLN
7.5000 mg | Freq: Four times a day (QID) | INTRAMUSCULAR | Status: AC
  Administered 2018-01-11 – 2018-01-12 (×3): 7.5 mg via INTRAVENOUS
  Filled 2018-01-11 (×3): qty 1

## 2018-01-11 MED ORDER — PROPOFOL 500 MG/50ML IV EMUL
INTRAVENOUS | Status: DC | PRN
  Administered 2018-01-11: 125 ug/kg/min via INTRAVENOUS

## 2018-01-11 MED ORDER — LACTATED RINGERS IV SOLN
INTRAVENOUS | Status: DC
  Administered 2018-01-11 (×3): via INTRAVENOUS

## 2018-01-11 MED ORDER — MIDAZOLAM HCL 2 MG/2ML IJ SOLN
INTRAMUSCULAR | Status: AC
  Filled 2018-01-11: qty 2

## 2018-01-11 MED ORDER — HYDRALAZINE HCL 20 MG/ML IJ SOLN
INTRAMUSCULAR | Status: AC
  Filled 2018-01-11: qty 1

## 2018-01-11 MED ORDER — CEFAZOLIN SODIUM-DEXTROSE 2-4 GM/100ML-% IV SOLN
2.0000 g | Freq: Four times a day (QID) | INTRAVENOUS | Status: AC
  Administered 2018-01-11 (×2): 2 g via INTRAVENOUS
  Filled 2018-01-11 (×2): qty 100

## 2018-01-11 MED ORDER — WATER FOR IRRIGATION, STERILE IR SOLN
Status: DC | PRN
  Administered 2018-01-11: 2000 mL

## 2018-01-11 MED ORDER — PHENOL 1.4 % MT LIQD: 1.0000 | OROMUCOSAL | Status: DC | PRN

## 2018-01-11 MED ORDER — SODIUM CHLORIDE 0.9 % IR SOLN
Status: DC | PRN
Start: 2018-01-11 — End: 2018-01-11
  Administered 2018-01-11: 1000 mL

## 2018-01-11 MED ORDER — ACETAMINOPHEN 325 MG PO TABS: 325.0000 mg | ORAL_TABLET | Freq: Four times a day (QID) | ORAL | Status: DC | PRN

## 2018-01-11 MED ORDER — DIPHENHYDRAMINE HCL 12.5 MG/5ML PO ELIX: 12.5000 mg | ORAL_SOLUTION | ORAL | Status: DC | PRN

## 2018-01-11 MED ORDER — MIDAZOLAM HCL 5 MG/5ML IJ SOLN
INTRAMUSCULAR | Status: DC | PRN
  Administered 2018-01-11 (×2): 1 mg via INTRAVENOUS

## 2018-01-11 MED ORDER — POLYETHYLENE GLYCOL 3350 17 G PO PACK: 17.0000 g | PACK | Freq: Every day | ORAL | Status: DC | PRN

## 2018-01-11 MED ORDER — MORPHINE SULFATE (PF) 2 MG/ML IV SOLN: 0.5000 mg | INTRAVENOUS | Status: DC | PRN

## 2018-01-11 MED ORDER — PROPOFOL 10 MG/ML IV BOLUS
INTRAVENOUS | Status: AC
  Filled 2018-01-11: qty 20

## 2018-01-11 MED ORDER — ONDANSETRON HCL 4 MG/2ML IJ SOLN
4.0000 mg | Freq: Four times a day (QID) | INTRAMUSCULAR | Status: DC | PRN
  Administered 2018-01-11: 4 mg via INTRAVENOUS
  Filled 2018-01-11: qty 2

## 2018-01-11 MED ORDER — PHENYLEPHRINE 40 MCG/ML (10ML) SYRINGE FOR IV PUSH (FOR BLOOD PRESSURE SUPPORT)
PREFILLED_SYRINGE | INTRAVENOUS | Status: DC | PRN
  Administered 2018-01-11 (×4): 80 ug via INTRAVENOUS

## 2018-01-11 MED ORDER — ONDANSETRON HCL 4 MG PO TABS: 4.0000 mg | ORAL_TABLET | Freq: Four times a day (QID) | ORAL | Status: DC | PRN

## 2018-01-11 MED ORDER — METOCLOPRAMIDE HCL 5 MG PO TABS: 5.0000 mg | ORAL_TABLET | Freq: Three times a day (TID) | ORAL | Status: DC | PRN

## 2018-01-11 MED ORDER — TRANEXAMIC ACID 1000 MG/10ML IV SOLN
1000.0000 mg | INTRAVENOUS | Status: AC
  Administered 2018-01-11: 1000 mg via INTRAVENOUS
  Filled 2018-01-11: qty 10

## 2018-01-11 MED ORDER — KETOROLAC TROMETHAMINE 30 MG/ML IJ SOLN
INTRAMUSCULAR | Status: AC
  Filled 2018-01-11: qty 1

## 2018-01-11 MED ORDER — METHOCARBAMOL 500 MG IVPB - SIMPLE MED
500.0000 mg | Freq: Four times a day (QID) | INTRAVENOUS | Status: DC | PRN
  Administered 2018-01-11: 500 mg via INTRAVENOUS
  Filled 2018-01-11: qty 50

## 2018-01-11 MED ORDER — DOCUSATE SODIUM 100 MG PO CAPS
100.0000 mg | ORAL_CAPSULE | Freq: Two times a day (BID) | ORAL | Status: DC
  Administered 2018-01-11 – 2018-01-12 (×2): 100 mg via ORAL
  Filled 2018-01-11 (×2): qty 1

## 2018-01-11 SURGICAL SUPPLY — 60 items
ACETAB CUP W GRIPTION 54MM (Plate) ×1 IMPLANT
ACETAB CUP W/GRIPTION 54 (Plate) ×2 IMPLANT
ADH SKN CLS APL DERMABOND .7 (GAUZE/BANDAGES/DRESSINGS) ×1
BAG SPEC THK2 15X12 ZIP CLS (MISCELLANEOUS) ×1
BAG ZIPLOCK 12X15 (MISCELLANEOUS) ×2 IMPLANT
CHLORAPREP W/TINT 26ML (MISCELLANEOUS) ×3 IMPLANT
CLOTH BEACON ORANGE TIMEOUT ST (SAFETY) ×3 IMPLANT
COVER PERINEAL POST (MISCELLANEOUS) ×3 IMPLANT
COVER SURGICAL LIGHT HANDLE (MISCELLANEOUS) ×3 IMPLANT
CUP ACETAB W/GRIPTION 54 (Plate) IMPLANT
DECANTER SPIKE VIAL GLASS SM (MISCELLANEOUS) ×3 IMPLANT
DERMABOND ADVANCED (GAUZE/BANDAGES/DRESSINGS) ×2
DERMABOND ADVANCED .7 DNX12 (GAUZE/BANDAGES/DRESSINGS) ×2 IMPLANT
DRAPE SHEET LG 3/4 BI-LAMINATE (DRAPES) ×9 IMPLANT
DRAPE STERI IOBAN 125X83 (DRAPES) ×3 IMPLANT
DRAPE U-SHAPE 47X51 STRL (DRAPES) ×6 IMPLANT
DRESSING AQUACEL AG SP 3.5X10 (GAUZE/BANDAGES/DRESSINGS) IMPLANT
DRSG AQUACEL AG ADV 3.5X10 (GAUZE/BANDAGES/DRESSINGS) ×3 IMPLANT
DRSG AQUACEL AG SP 3.5X10 (GAUZE/BANDAGES/DRESSINGS) ×3
ELECT PENCIL ROCKER SW 15FT (MISCELLANEOUS) ×3 IMPLANT
ELECT REM PT RETURN 15FT ADLT (MISCELLANEOUS) ×3 IMPLANT
GAUZE SPONGE 4X4 12PLY STRL (GAUZE/BANDAGES/DRESSINGS) ×3 IMPLANT
GLOVE BIO SURGEON STRL SZ8.5 (GLOVE) ×8 IMPLANT
GLOVE BIOGEL M STRL SZ7.5 (GLOVE) ×4 IMPLANT
GLOVE BIOGEL PI IND STRL 7.0 (GLOVE) IMPLANT
GLOVE BIOGEL PI IND STRL 7.5 (GLOVE) IMPLANT
GLOVE BIOGEL PI IND STRL 8.5 (GLOVE) ×1 IMPLANT
GLOVE BIOGEL PI IND STRL 9 (GLOVE) IMPLANT
GLOVE BIOGEL PI INDICATOR 7.0 (GLOVE) ×4
GLOVE BIOGEL PI INDICATOR 7.5 (GLOVE) ×6
GLOVE BIOGEL PI INDICATOR 8.5 (GLOVE) ×2
GLOVE BIOGEL PI INDICATOR 9 (GLOVE) ×2
GLOVE SURG SS PI 7.0 STRL IVOR (GLOVE) ×2 IMPLANT
GLOVE SURG SS PI 7.5 STRL IVOR (GLOVE) ×2 IMPLANT
GOWN SPEC L3 XXLG W/TWL (GOWN DISPOSABLE) ×5 IMPLANT
HANDPIECE INTERPULSE COAX TIP (DISPOSABLE) ×3
HEAD FEMORAL 32 CERAMIC (Hips) ×2 IMPLANT
HOLDER FOLEY CATH W/STRAP (MISCELLANEOUS) ×3 IMPLANT
HOOD PEEL AWAY FLYTE STAYCOOL (MISCELLANEOUS) ×12 IMPLANT
LINER PINN ACET NEUT 32X54 ×2 IMPLANT
MARKER SKIN DUAL TIP RULER LAB (MISCELLANEOUS) ×3 IMPLANT
NDL SPNL 18GX3.5 QUINCKE PK (NEEDLE) ×1 IMPLANT
NEEDLE SPNL 18GX3.5 QUINCKE PK (NEEDLE) ×3 IMPLANT
PACK ANTERIOR HIP CUSTOM (KITS) ×3 IMPLANT
SAW OSC TIP CART 19.5X105X1.3 (SAW) ×3 IMPLANT
SEALER BIPOLAR AQUA 6.0 (INSTRUMENTS) ×3 IMPLANT
SET HNDPC FAN SPRY TIP SCT (DISPOSABLE) ×1 IMPLANT
STEM TRI LOC BPS GRIPTON SZ 5 (Hips) IMPLANT
SUT ETHIBOND NAB CT1 #1 30IN (SUTURE) ×6 IMPLANT
SUT MNCRL AB 3-0 PS2 18 (SUTURE) ×3 IMPLANT
SUT MON AB 2-0 CT1 36 (SUTURE) ×6 IMPLANT
SUT STRATAFIX PDO 1 14 VIOLET (SUTURE) ×3
SUT STRATFX PDO 1 14 VIOLET (SUTURE) ×1
SUT VIC AB 2-0 CT1 27 (SUTURE) ×3
SUT VIC AB 2-0 CT1 TAPERPNT 27 (SUTURE) ×1 IMPLANT
SUTURE STRATFX PDO 1 14 VIOLET (SUTURE) ×1 IMPLANT
SYR 50ML LL SCALE MARK (SYRINGE) ×3 IMPLANT
TRAY FOLEY MTR SLVR 14FR STAT (SET/KITS/TRAYS/PACK) ×2 IMPLANT
TRI LOC BPS W GRIPTON SZ 5 (Hips) ×3 IMPLANT
YANKAUER SUCT BULB TIP 10FT TU (MISCELLANEOUS) ×3 IMPLANT

## 2018-01-11 NOTE — Anesthesia Postprocedure Evaluation (Signed)
Anesthesia Post Note  Patient: Lundyn Coste  Procedure(s) Performed: RIGHT TOTAL HIP ARTHROPLASTY ANTERIOR APPROACH (Right Hip)     Patient location during evaluation: PACU Anesthesia Type: Spinal Level of consciousness: oriented, awake and alert and awake Pain management: pain level controlled Vital Signs Assessment: post-procedure vital signs reviewed and stable Respiratory status: spontaneous breathing, respiratory function stable and nonlabored ventilation Cardiovascular status: blood pressure returned to baseline and stable Postop Assessment: no headache, no backache, no apparent nausea or vomiting and spinal receding Anesthetic complications: no    Last Vitals:  Vitals:   01/11/18 1257 01/11/18 1300  BP:  (!) 144/91  Pulse:  71  Resp:  12  Temp: (!) 36.4 C   SpO2:  100%    Last Pain:  Vitals:   01/11/18 1257  TempSrc:   PainSc: [place]

## 2018-01-11 NOTE — Interval H&P Note (Signed)
History and Physical Interval Note:  01/11/2018 9:41 AM   Christy Kirby  has presented today for surgery, with the diagnosis of Degenerative joint disease right hip  The various methods of treatment have been discussed with the patient and family. After consideration of risks, benefits and other options for treatment, the patient has consented to  Procedure(s) with comments: RIGHT TOTAL HIP ARTHROPLASTY ANTERIOR APPROACH (Right) - Needs RNFA as a surgical intervention .  The patient's history has been reviewed, patient examined, no change in status, stable for surgery.  I have reviewed the patient's chart and labs.  Questions were answered to the patient's satisfaction.     Hilton Cork Naidelin Gugliotta

## 2018-01-11 NOTE — Anesthesia Preprocedure Evaluation (Signed)
Anesthesia Evaluation  Patient identified by MRN, date of birth, ID band Patient awake    Reviewed: Allergy & Precautions, NPO status , Patient's Chart, lab work & pertinent test results  Airway Mallampati: II  TM Distance: >3 FB Neck ROM: Full    Dental  (+) Teeth Intact, Dental Advisory Given   Pulmonary former smoker,    Pulmonary exam normal breath sounds clear to auscultation       Cardiovascular Exercise Tolerance: Good negative cardio ROS Normal cardiovascular exam Rhythm:Regular Rate:Normal     Neuro/Psych negative neurological ROS     GI/Hepatic Neg liver ROS, GERD  ,  Endo/Other  negative endocrine ROS  Renal/GU negative Renal ROS     Musculoskeletal  (+) Arthritis , Degenerative joint disease right hip   Abdominal   Peds  Hematology negative hematology ROS (+) Plt 265k   Anesthesia Other Findings Day of surgery medications reviewed with the patient.  Reproductive/Obstetrics                             Anesthesia Physical Anesthesia Plan  ASA: II  Anesthesia Plan: Spinal   Post-op Pain Management:    Induction:   PONV Risk Score and Plan: 2 and Dexamethasone and Ondansetron  Airway Management Planned: Natural Airway and Simple Face Mask  Additional Equipment:   Intra-op Plan:   Post-operative Plan:   Informed Consent: I have reviewed the patients History and Physical, chart, labs and discussed the procedure including the risks, benefits and alternatives for the proposed anesthesia with the patient or authorized representative who has indicated his/her understanding and acceptance.   Dental advisory given  Plan Discussed with: CRNA, Anesthesiologist and Surgeon  Anesthesia Plan Comments: (Discussed risks and benefits of and differences between spinal and general. Discussed risks of spinal including headache, backache, failure, bleeding, infection, and nerve  damage. Patient consents to spinal. Questions answered. Coagulation studies and platelet count acceptable.)        Anesthesia Quick Evaluation

## 2018-01-11 NOTE — Anesthesia Procedure Notes (Signed)
Spinal  Patient location during procedure: OR Start time: 01/11/2018 10:57 AM End time: 01/11/2018 10:59 AM Staffing Anesthesiologist: Catalina Gravel, MD Resident/CRNA: Lind Covert, CRNA Performed: resident/CRNA  Preanesthetic Checklist Completed: patient identified, site marked, surgical consent, pre-op evaluation, timeout performed, IV checked, risks and benefits discussed and monitors and equipment checked Spinal Block Patient position: sitting Prep: DuraPrep Patient monitoring: heart rate, cardiac monitor and continuous pulse ox Approach: midline Location: L3-4 Injection technique: single-shot Needle Needle type: Pencan  Needle gauge: 24 G Needle length: 10 cm Needle insertion depth: 7 cm Assessment Sensory level: T6 Additional Notes Timeout performed. SAB kit date checked. SAB without difficulty

## 2018-01-11 NOTE — Transfer of Care (Signed)
Immediate Anesthesia Transfer of Care Note  Patient: Christy Kirby  Procedure(s) Performed: Procedure(s): RIGHT TOTAL HIP ARTHROPLASTY ANTERIOR APPROACH (Right)  Patient Location: PACU  Anesthesia Type:Spinal  Level of Consciousness:  sedated, patient cooperative and responds to stimulation  Airway & Oxygen Therapy:Patient Spontanous Breathing and Patient connected to face mask oxgen  Post-op Assessment:  Report given to PACU RN and Post -op Vital signs reviewed and stable  Post vital signs:  Reviewed and stable  Last Vitals:  Vitals:   01/11/18 0758 01/11/18 1257  BP: (!) 162/99   Pulse: 79   Resp: 16   Temp: 36.8 C (!) (P) 36.4 C  SpO2: 428%     Complications: No apparent anesthesia complications

## 2018-01-11 NOTE — Plan of Care (Signed)
Plan of care discussed with patient and family 

## 2018-01-11 NOTE — Discharge Instructions (Signed)
°Dr. Gabriellah Rabel °Joint Replacement Specialist °[facility] °[address] °[place], [place] [address] °[phone] ° ° °TOTAL HIP REPLACEMENT POSTOPERATIVE DIRECTIONS ° ° ° °Hip Rehabilitation, Guidelines Following Surgery  ° °WEIGHT BEARING °Weight bearing as tolerated with assist device (walker, cane, etc) as directed, use it as long as suggested by your surgeon or therapist, typically at least 4-6 weeks. ° °The results of a hip operation are greatly improved after range of motion and muscle strengthening exercises. Follow all safety measures which are given to protect your hip. If any of these exercises cause increased pain or swelling in your joint, decrease the amount until you are comfortable again. Then slowly increase the exercises. Call your caregiver if you have problems or questions.  ° °HOME CARE INSTRUCTIONS  °Most of the following instructions are designed to prevent the dislocation of your new hip.  °Remove items at home which could result in a fall. This includes throw rugs or furniture in walking pathways.  °Continue medications as instructed at time of discharge. °· You may have some home medications which will be placed on hold until you complete the course of blood thinner medication. °· You may start showering once you are discharged home. Do not remove your dressing. °Do not put on socks or shoes without following the instructions of your caregivers.   °Sit on chairs with arms. Use the chair arms to help push yourself up when arising.  °Arrange for the use of a toilet seat elevator so you are not sitting low.  °· Walk with walker as instructed.  °You may resume a sexual relationship in one month or when given the OK by your caregiver.  °Use walker as long as suggested by your caregivers.  °You may put full weight on your legs and walk as much as is comfortable. °Avoid periods of inactivity such as sitting longer than an hour when not asleep. This helps prevent  blood clots.  °You may return to work once you are cleared by your surgeon.  °Do not drive a car for 6 weeks or until released by your surgeon.  °Do not drive while taking narcotics.  °Wear elastic stockings for two weeks following surgery during the day but you may remove then at night.  °Make sure you keep all of your appointments after your operation with all of your doctors and caregivers. You should call the office at the above phone number and make an appointment for approximately two weeks after the date of your surgery. °Please pick up a stool softener and laxative for home use as long as you are requiring pain medications. °· ICE to the affected hip every three hours for 30 minutes at a time and then as needed for pain and swelling. Continue to use ice on the hip for pain and swelling from surgery. You may notice swelling that will progress down to the foot and ankle.  This is normal after surgery.  Elevate the leg when you are not up walking on it.   °It is important for you to complete the blood thinner medication as prescribed by your doctor. °· Continue to use the breathing machine which will help keep your temperature down.  It is common for your temperature to cycle up and down following surgery, especially at night when you are not up moving around and exerting yourself.  The breathing machine keeps your lungs expanded and your temperature down. ° °RANGE OF MOTION AND STRENGTHENING EXERCISES  °These exercises are   designed to help you keep full movement of your hip joint. Follow your caregiver's or physical therapist's instructions. Perform all exercises about fifteen times, three times per day or as directed. Exercise both hips, even if you have had only one joint replacement. These exercises can be done on a training (exercise) mat, on the floor, on a table or on a bed. Use whatever works the best and is most comfortable for you. Use music or television while you are exercising so that the exercises  are a pleasant break in your day. This will make your life better with the exercises acting as a break in routine you can look forward to.  °Lying on your back, slowly slide your foot toward your buttocks, raising your knee up off the floor. Then slowly slide your foot back down until your leg is straight again.  °Lying on your back spread your legs as far apart as you can without causing discomfort.  °Lying on your side, raise your upper leg and foot straight up from the floor as far as is comfortable. Slowly lower the leg and repeat.  °Lying on your back, tighten up the muscle in the front of your thigh (quadriceps muscles). You can do this by keeping your leg straight and trying to raise your heel off the floor. This helps strengthen the largest muscle supporting your knee.  °Lying on your back, tighten up the muscles of your buttocks both with the legs straight and with the knee bent at a comfortable angle while keeping your heel on the floor.  ° °SKILLED REHAB INSTRUCTIONS: °If the patient is transferred to a skilled rehab facility following release from the hospital, a list of the current medications will be sent to the facility for the patient to continue.  When discharged from the skilled rehab facility, please have the facility set up the patient's Home Health Physical Therapy prior to being released. Also, the skilled facility will be responsible for providing the patient with their medications at time of release from the facility to include their pain medication and their blood thinner medication. If the patient is still at the rehab facility at time of the two week follow up appointment, the skilled rehab facility will also need to assist the patient in arranging follow up appointment in our office and any transportation needs. ° °MAKE SURE YOU:  °Understand these instructions.  °Will watch your condition.  °Will get help right away if you are not doing well or get worse. ° °Pick up stool softner and  laxative for home use following surgery while on pain medications. °Do not remove your dressing. °The dressing is waterproof--it is OK to take showers. °Continue to use ice for pain and swelling after surgery. °Do not use any lotions or creams on the incision until instructed by your surgeon. °Total Hip Protocol. ° ° °

## 2018-01-11 NOTE — Op Note (Signed)
OPERATIVE REPORT  SURGEON: Rod Can, MD   ASSISTANT: Nehemiah Massed, PA-C.  PREOPERATIVE DIAGNOSIS: Right hip arthritis.   POSTOPERATIVE DIAGNOSIS: Right hip arthritis.   PROCEDURE: Right total hip arthroplasty, anterior approach.   IMPLANTS: DePuy Tri Lock stem, size 5, hi offset. DePuy Pinnacle Cup, size 54 mm. DePuy Altrx liner, size 32 by 54 mm, neutral. DePuy Biolox ceramic head ball, size 32 + 1 mm.  ANESTHESIA:  Spinal  ESTIMATED BLOOD LOSS:-300 mL    ANTIBIOTICS: 2 g Ancef.  DRAINS: None.  COMPLICATIONS: None.   CONDITION: PACU - hemodynamically stable.   BRIEF CLINICAL NOTE: Christy Kirby is a 67 y.o. female with a long-standing history of Right hip arthritis. After failing conservative management, the patient was indicated for total hip arthroplasty. The risks, benefits, and alternatives to the procedure were explained, and the patient elected to proceed.  PROCEDURE IN DETAIL: Surgical site was marked by myself in the pre-op holding area. Once inside the operating room, spinal anesthesia was obtained, and a foley catheter was inserted. The patient was then positioned on the Hana table. All bony prominences were well padded. The hip was prepped and draped in the normal sterile surgical fashion. A time-out was called verifying side and site of surgery. The patient received IV antibiotics within 60 minutes of beginning the procedure.  The direct anterior approach to the hip was performed through the Hueter interval. Lateral femoral circumflex vessels were treated with the Auqumantys. The anterior capsule was exposed and an inverted T capsulotomy was made.The femoral neck cut was made to the level of the templated cut. A corkscrew was placed into the head and the head was removed. The femoral head was found to have eburnated bone. The head was passed to the back table and was measured.  Acetabular exposure was achieved, and the pulvinar and labrum  were excised. Sequential reaming of the acetabulum was then performed up to a size 53 mm reamer. A 54 mm cup was then opened and impacted into place at approximately 40 degrees of abduction and 20 degrees of anteversion. The final polyethylene liner was impacted into place and acetabular osteophytes were removed.   I then gained femoral exposure taking care to protect the abductors and greater trochanter. This was performed using standard external rotation, extension, and adduction. The capsule was peeled off the inner aspect of the greater trochanter, taking care to preserve the short external rotators. A cookie cutter was used to enter the femoral canal, and then the femoral canal finder was placed. Sequential broaching was performed up to a size 5. Calcar planer was used on the femoral neck remnant. I placed a hi offset neck and a trial head ball. The hip was reduced. Leg lengths and offset were checked fluoroscopically. The hip was dislocated and trial components were removed. The final implants were placed, and the hip was reduced.  Fluoroscopy was used to confirm component position and leg lengths. At 90 degrees of external rotation and full extension, the hip was stable to an anterior directed force. Lengthening of a few millimeters was required to reproduce adequate soft tissue tension.  The wound was copiously irrigated with normal saline using pulse lavage. Marcaine solution was injected into the periarticular soft tissue. The wound was closed in layers using #1 Vicryl and V-Loc for the fascia, 2-0 Vicryl for the subcutaneous fat, 2-0 Monocryl for the deep dermal layer, 3-0 running Monocryl subcuticular stitch, and Dermabond for the skin. Once the glue was fully dried, an  Aquacell Ag dressing was applied. The patient was transported to the recovery room in stable condition. Sponge, needle, and instrument counts were correct at the end of the case x2. The patient tolerated the  procedure well and there were no known complications.  Please note that a surgical assistant was a medical necessity for this procedure to perform it in a safe and expeditious manner. Assistant was necessary to provide appropriate retraction of vital neurovascular structures, to prevent femoral fracture, and to allow for anatomic placement of the prosthesis.

## 2018-01-12 ENCOUNTER — Encounter (HOSPITAL_COMMUNITY): Payer: Self-pay | Admitting: Orthopedic Surgery

## 2018-01-12 LAB — BASIC METABOLIC PANEL
Anion gap: 6 (ref 5–15)
BUN: 14 mg/dL (ref 8–23)
CHLORIDE: 109 mmol/L (ref 98–111)
CO2: 26 mmol/L (ref 22–32)
CREATININE: 0.89 mg/dL (ref 0.44–1.00)
Calcium: 8.8 mg/dL — ABNORMAL LOW (ref 8.9–10.3)
Glucose, Bld: 136 mg/dL — ABNORMAL HIGH (ref 70–99)
POTASSIUM: 4.1 mmol/L (ref 3.5–5.1)
SODIUM: 141 mmol/L (ref 135–145)

## 2018-01-12 LAB — CBC
HCT: 31.1 % — ABNORMAL LOW (ref 36.0–46.0)
Hemoglobin: 10.4 g/dL — ABNORMAL LOW (ref 12.0–15.0)
MCH: 30 pg (ref 26.0–34.0)
MCHC: 33.4 g/dL (ref 30.0–36.0)
MCV: 89.6 fL (ref 78.0–100.0)
Platelets: 249 10*3/uL (ref 150–400)
RBC: 3.47 MIL/uL — AB (ref 3.87–5.11)
RDW: 12.8 % (ref 11.5–15.5)
WBC: 8.3 10*3/uL (ref 4.0–10.5)

## 2018-01-12 MED ORDER — ASPIRIN 81 MG PO CHEW: 81.0000 mg | CHEWABLE_TABLET | Freq: Two times a day (BID) | ORAL | 1 refills | Status: DC

## 2018-01-12 MED ORDER — DOCUSATE SODIUM 100 MG PO CAPS: 100.0000 mg | ORAL_CAPSULE | Freq: Two times a day (BID) | ORAL | 1 refills | Status: DC

## 2018-01-12 MED ORDER — SENNA 8.6 MG PO TABS
1.0000 | ORAL_TABLET | Freq: Two times a day (BID) | ORAL | 0 refills | Status: DC
Start: 2018-01-12 — End: 2020-01-22

## 2018-01-12 MED ORDER — HYDROCODONE-ACETAMINOPHEN 5-325 MG PO TABS: 1.0000 | ORAL_TABLET | Freq: Four times a day (QID) | ORAL | 0 refills | Status: DC | PRN

## 2018-01-12 MED ORDER — ONDANSETRON HCL 4 MG PO TABS: 4.0000 mg | ORAL_TABLET | Freq: Four times a day (QID) | ORAL | 0 refills | Status: DC | PRN

## 2018-01-12 NOTE — Progress Notes (Signed)
Physical Therapy Treatment Patient Details Name: Christy Kirby MRN: [id number] DOB: 12-Dec-1950 Today's Date: 01/12/2018    History of Present Illness s/p R DA THA    PT Comments    Pt progressing well;  Reviewed HEP, LB dressing technique, gait and stairs with pt and family; pt feels ready to d/c home   Follow Up Recommendations  Follow surgeon's recommendation for DC plan and follow-up therapies     Equipment Recommendations  None recommended by PT    Recommendations for Other Services       Precautions / Restrictions Precautions Precautions: Fall Restrictions Weight Bearing Restrictions: No Other Position/Activity Restrictions: WBAT    Mobility  Bed Mobility                  Transfers   Equipment used: Rolling walker (2 wheeled) Transfers: Sit to/from Stand Sit to Stand: Supervision         General transfer comment: cues for hand placement and RLE management  Ambulation/Gait Ambulation/Gait assistance: Supervision Gait Distance (Feet): 15 Feet(x2) Assistive device: Rolling walker (2 wheeled) Gait Pattern/deviations: Step-to pattern;Decreased weight shift to right     General Gait Details: cues for sequence and RW position   Stairs Stairs: (verbally reviewed up/down step stool to get in bed )           Wheelchair Mobility    Modified Rankin (Stroke Patients Only)       Balance                                            Cognition Arousal/Alertness: Awake/alert Behavior During Therapy: WFL for tasks assessed/performed Overall Cognitive Status: Within Functional Limits for tasks assessed                                        Exercises Total Joint Exercises Ankle Circles/Pumps: AROM;Both;10 reps Quad Sets: AROM;Both;10 reps Short Arc Quad: AROM;Right;10 reps Heel Slides: AAROM;Right;10 reps Hip ABduction/ADduction: AROM;AAROM;Right;10 reps    General Comments        Pertinent  Vitals/Pain Pain Assessment: 0-10 Pain Score: 2  Pain Location: right hip Pain Descriptors / Indicators: Sore;Grimacing;Guarding Pain Intervention(s): Monitored during session    Home Living                      Prior Function            PT Goals (current goals can now be found in the care plan section) Acute Rehab PT Goals Patient Stated Goal: home soon, get some sleep PT Goal Formulation: With patient Time For Goal Achievement: 01/19/18 Potential to Achieve Goals: Good Progress towards PT goals: Progressing toward goals    Frequency    7X/week      PT Plan Current plan remains appropriate    Co-evaluation              AM-PAC PT "6 Clicks" Daily Activity  Outcome Measure  Difficulty turning over in bed (including adjusting bedclothes, sheets and blankets)?: A Lot Difficulty moving from lying on back to sitting on the side of the bed? : A Lot Difficulty sitting down on and standing up from a chair with arms (e.g., wheelchair, bedside commode, etc,.)?: A Little Help needed moving to and from a bed to  chair (including a wheelchair)?: A Little Help needed walking in hospital room?: A Little Help needed climbing 3-5 steps with a railing? : A Little 6 Click Score: 16    End of Session Equipment Utilized During Treatment: Gait belt Activity Tolerance: Patient tolerated treatment well Patient left: in chair;with call bell/phone within reach;with chair alarm set;with family/visitor present   PT Visit Diagnosis: Difficulty in walking, not elsewhere classified (R26.2)     Time: 4356-8616 PT Time Calculation (min) (ACUTE ONLY): 34 min  Charges:  $Gait Training: 8-22 mins $Therapeutic Exercise: 8-22 mins                     Kenyon Ana, PT Pager: [phone] 01/12/2018    Kenyon Ana 01/12/2018, 2:50 PM

## 2018-01-12 NOTE — Discharge Summary (Signed)
Physician Discharge Summary  Patient ID: Christy Kirby MRN: [id number] DOB/AGE: Sep 04, 1950 67 y.o.  Admit date: 01/11/2018 Discharge date: 01/12/2018  Admission Diagnoses:  Osteoarthritis of right hip  Discharge Diagnoses:  Principal Problem:   Osteoarthritis of right hip   Past Medical History:  Diagnosis Date  . Arthritis    hip  . GERD (gastroesophageal reflux disease)   . Hyperlipidemia     Surgeries: Procedure(s): RIGHT TOTAL HIP ARTHROPLASTY ANTERIOR APPROACH on 01/11/2018   Consultants (if any):   Discharged Condition: Improved  Hospital Course: Christy Kirby is an 67 y.o. female who was admitted 01/11/2018 with a diagnosis of Osteoarthritis of right hip and went to the operating room on 01/11/2018 and underwent the above named procedures.    She was given perioperative antibiotics:  Anti-infectives (From admission, onward)   Start     Dose/Rate Route Frequency Ordered Stop   01/11/18 1800  ceFAZolin (ANCEF) IVPB 2g/100 mL premix     2 g 200 mL/hr over 30 Minutes Intravenous Every 6 hours 01/11/18 1419 01/12/18 0021   01/11/18 0815  ceFAZolin (ANCEF) IVPB 2g/100 mL premix     2 g 200 mL/hr over 30 Minutes Intravenous On call to O.R. 01/11/18 0802 01/11/18 1101    .  She was given sequential compression devices, early ambulation, and ASA for DVT prophylaxis.  She benefited maximally from the hospital stay and there were no complications.    Recent vital signs:  Vitals:   01/12/18 0532 01/12/18 0934  BP: 126/82 129/80  Pulse: 88 98  Resp: 16 17  Temp: 98.6 F (37 C) 98.7 F (37.1 C)  SpO2: 99% 99%    Recent laboratory studies:  Lab Results  Component Value Date   HGB 10.4 (L) 01/12/2018   HGB 12.5 01/02/2018   HGB 12.4 02/08/2017   Lab Results  Component Value Date   WBC 8.3 01/12/2018   PLT 249 01/12/2018   No results found for: INR Lab Results  Component Value Date   NA 141 01/12/2018   K 4.1 01/12/2018   CL 109 01/12/2018   CO2 26  01/12/2018   BUN 14 01/12/2018   CREATININE 0.89 01/12/2018   GLUCOSE 136 (H) 01/12/2018    Discharge Medications:   Allergies as of 01/12/2018      Reactions   Codeine Nausea Only      Medication List    TAKE these medications   aspirin 81 MG chewable tablet Chew 1 tablet (81 mg total) by mouth 2 (two) times daily.   docusate sodium 100 MG capsule Commonly known as:  COLACE Take 1 capsule (100 mg total) by mouth 2 (two) times daily.   HYDROcodone-acetaminophen 5-325 MG tablet Commonly known as:  NORCO/VICODIN Take 1-2 tablets by mouth every 6 (six) hours as needed (postop hip pain).   ibuprofen 800 MG tablet Commonly known as:  ADVIL,MOTRIN TAKE 1 TABLET (800 MG TOTAL) BY MOUTH EVERY 6 (SIX) HOURS AS NEEDED FOR FEVER OR MODERATE PAIN.   multivitamin with minerals tablet Take 1 tablet by mouth daily.   ondansetron 4 MG tablet Commonly known as:  ZOFRAN Take 1 tablet (4 mg total) by mouth every 6 (six) hours as needed for nausea.   senna 8.6 MG Tabs tablet Commonly known as:  SENOKOT Take 1 tablet (8.6 mg total) by mouth 2 (two) times daily.       Diagnostic Studies: Dg Pelvis Portable  Result Date: 01/11/2018 CLINICAL DATA:  Status post right hip  replacement EXAM: PORTABLE PELVIS 1-2 VIEWS COMPARISON:  None. FINDINGS: Right hip prosthesis is seen in satisfactory position. No acute bony or soft tissue abnormality is noted. IMPRESSION: Status post right hip replacement Electronically Signed   By: Inez Catalina M.D.   On: 01/11/2018 13:32   Dg C-arm 1-60 Min-no Report  Result Date: 01/11/2018 Fluoroscopy was utilized by the requesting physician.  No radiographic interpretation.   Dg Hip Operative Unilat W Or W/o Pelvis Right  Result Date: 01/11/2018 CLINICAL DATA:  Right hip replacement EXAM: OPERATIVE RIGHT HIP WITH PELVIS COMPARISON:  None. FLUOROSCOPY TIME:  Radiation Exposure Index (as provided by the fluoroscopic device): 0.7 mGy If the device does not provide the  exposure index: Fluoroscopy Time:  10 seconds Number of Acquired Images:  2 FINDINGS: Right hip prosthesis is noted in satisfactory position. No acute bony abnormality is noted. IMPRESSION: Right hip replacement Electronically Signed   By: Inez Catalina M.D.   On: 01/11/2018 13:44    Disposition: Discharge disposition: 01-Home or Self Care       Discharge Instructions    Call MD / Call 911   Complete by:  As directed    If you experience chest pain or shortness of breath, CALL 911 and be transported to the hospital emergency room.  If you develope a fever above 101 F, pus (white drainage) or increased drainage or redness at the wound, or calf pain, call your surgeon's office.   Constipation Prevention   Complete by:  As directed    Drink plenty of fluids.  Prune juice may be helpful.  You may use a stool softener, such as Colace (over the counter) 100 mg twice a day.  Use MiraLax (over the counter) for constipation as needed.   Diet - low sodium heart healthy   Complete by:  As directed    Driving restrictions   Complete by:  As directed    No driving for 6 weeks   Increase activity slowly as tolerated   Complete by:  As directed    Lifting restrictions   Complete by:  As directed    No lifting for 6 weeks   TED hose   Complete by:  As directed    Use stockings (TED hose) for 2 weeks on both leg(s).  You may remove them at night for sleeping.      Follow-up Information    Xochil Shanker, Aaron Edelman, MD. Schedule an appointment as soon as possible for a visit in 2 weeks.   Specialty:  Orthopedic Surgery Why:  For wound re-check Contact information: [address] [place] [address] [phone]            Signed: Hilton Cork Nataline Basara 01/12/2018, 1:33 PM

## 2018-01-12 NOTE — Evaluation (Signed)
Physical Therapy Evaluation Patient Details Name: Christy Kirby MRN: [id number] DOB: 06-Mar-1951 Today's Date: 01/12/2018   History of Present Illness  s/p R DA THA  Clinical Impression  Pt is s/p THA resulting in the deficits listed below (see PT Problem List). Pt amb 100' with RW and min/guard, should continue to progress well; will see again to review HEP;  Pt will benefit from skilled PT to increase their independence and safety with mobility to allow discharge to the venue listed below.    Follow Up Recommendations Follow surgeon's recommendation for DC plan and follow-up therapies    Equipment Recommendations  None recommended by PT    Recommendations for Other Services       Precautions / Restrictions Precautions Precautions: Fall Restrictions Weight Bearing Restrictions: No Other Position/Activity Restrictions: WBAT      Mobility  Bed Mobility Overal bed mobility: Needs Assistance Bed Mobility: Supine to Sit     Supine to sit: Min assist     General bed mobility comments: assist with RLE  Transfers Overall transfer level: Needs assistance Equipment used: Rolling walker (2 wheeled) Transfers: Sit to/from Stand Sit to Stand: Min guard         General transfer comment: cues for hand placement and RLE management  Ambulation/Gait Ambulation/Gait assistance: Min guard Gait Distance (Feet): 100 Feet   Gait Pattern/deviations: Step-to pattern;Decreased weight shift to right     General Gait Details: cues for sequence and RW position  Stairs            Wheelchair Mobility    Modified Rankin (Stroke Patients Only)       Balance Overall balance assessment: No apparent balance deficits (not formally assessed)                                           Pertinent Vitals/Pain Pain Assessment: 0-10 Pain Score: 5  Pain Location: right hip Pain Descriptors / Indicators: Sore;Grimacing;Guarding Pain Intervention(s): Limited  activity within patient's tolerance;Monitored during session;Premedicated before session;Repositioned;Ice applied    Home Living Family/patient expects to be discharged to:: Private residence     Type of Home: House Home Access: Stairs to enter   [employer] of Steps: 1-2 Home Layout: Two level;Able to live on main level with bedroom/bathroom Home Equipment: Gilford Rile - 2 wheels      Prior Function Level of Independence: Independent               Hand Dominance        Extremity/Trunk Assessment   Upper Extremity Assessment Upper Extremity Assessment: Overall WFL for tasks assessed    Lower Extremity Assessment Lower Extremity Assessment: RLE deficits/detail RLE Deficits / Details: AAROM grossly WFL; strength at least 3/5, limited by post op pain and weakness       Communication   Communication: No difficulties  Cognition Arousal/Alertness: Awake/alert Behavior During Therapy: WFL for tasks assessed/performed Overall Cognitive Status: Within Functional Limits for tasks assessed                                        General Comments      Exercises     Assessment/Plan    PT Assessment Patient needs continued PT services  PT Problem List Decreased activity tolerance;Decreased mobility;Pain;Decreased knowledge of use of DME  PT Treatment Interventions Functional mobility training;Gait training;DME instruction;Therapeutic activities;Therapeutic exercise;Patient/family education;Stair training    PT Goals (Current goals can be found in the Care Plan section)  Acute Rehab PT Goals Patient Stated Goal: home soon, get some sleep PT Goal Formulation: With patient Time For Goal Achievement: 01/19/18 Potential to Achieve Goals: Good    Frequency 7X/week   Barriers to discharge        Co-evaluation               AM-PAC PT "6 Clicks" Daily Activity  Outcome Measure Difficulty turning over in bed (including adjusting  bedclothes, sheets and blankets)?: Unable Difficulty moving from lying on back to sitting on the side of the bed? : Unable Difficulty sitting down on and standing up from a chair with arms (e.g., wheelchair, bedside commode, etc,.)?: Unable Help needed moving to and from a bed to chair (including a wheelchair)?: A Little Help needed walking in hospital room?: A Little Help needed climbing 3-5 steps with a railing? : A Lot 6 Click Score: 11    End of Session Equipment Utilized During Treatment: Gait belt Activity Tolerance: Patient tolerated treatment well Patient left: in chair;with call bell/phone within reach;with chair alarm set   PT Visit Diagnosis: Difficulty in walking, not elsewhere classified (R26.2)    Time: 6384-6659 PT Time Calculation (min) (ACUTE ONLY): 23 min   Charges:   PT Evaluation $PT Eval Low Complexity: 1 Low PT Treatments $Gait Training: 8-22 mins          [facility] 01/12/2018, 9:59 AM

## 2018-01-12 NOTE — Care Management Note (Signed)
Case Management Note  Patient Details  Name: Christy Kirby MRN: [id number] Date of Birth: [birth year]  Subjective/Objective:         Pod 1 tah           Action/Plan: Plan is to go home with poss hhc/will follow for needs  Expected Discharge Date:                  Expected Discharge Plan:  [place]  In-House Referral:     Discharge planning Services  CM Consult  Post Acute Care Choice:    Choice offered to:     DME Arranged:    DME Agency:     HH Arranged:    HH Agency:     Status of Service:  In process, will continue to follow  If discussed at Long Length of Stay Meetings, dates discussed:    Additional Comments:  Leeroy Cha, RN 01/12/2018, 11:23 AM

## 2018-01-12 NOTE — Progress Notes (Signed)
    Subjective:  Patient reports pain as mild to moderate.  Denies N/V/CP/SOB. No c/o. Wants to go home.  Objective:   VITALS:   Vitals:   01/11/18 2121 01/12/18 0106 01/12/18 0532 01/12/18 0934  BP: 133/77 (!) 134/95 126/82 129/80  Pulse: 100 (!) 105 88 98  Resp: 15 16 16 17   Temp: 98.4 F (36.9 C) 98.5 F (36.9 C) 98.6 F (37 C) 98.7 F (37.1 C)  TempSrc: Oral Oral Oral Oral  SpO2: 100% 99% 99% 99%  Weight:      Height:        NAD ABD soft Sensation intact distally Intact pulses distally Dorsiflexion/Plantar flexion intact Incision: dressing C/D/I Compartment soft   Lab Results  Component Value Date   WBC 8.3 01/12/2018   HGB 10.4 (L) 01/12/2018   HCT 31.1 (L) 01/12/2018   MCV 89.6 01/12/2018   PLT 249 01/12/2018   BMET    Component Value Date/Time   NA 141 01/12/2018 0516   K 4.1 01/12/2018 0516   CL 109 01/12/2018 0516   CO2 26 01/12/2018 0516   GLUCOSE 136 (H) 01/12/2018 0516   BUN 14 01/12/2018 0516   CREATININE 0.89 01/12/2018 0516   CALCIUM 8.8 (L) 01/12/2018 0516   GFRNONAA >60 01/12/2018 0516   GFRAA >60 01/12/2018 0516     Assessment/Plan: 1 Day Post-Op   Principal Problem:   Osteoarthritis of right hip   WBAT with walker DVT ppx: Aspirin, SCDs, TEDS PO pain control PT/OT Dispo: D/C home with HEP    Hilton Cork Merri Dimaano 01/12/2018, 1:29 PM   Rod Can, MD Cell [phone]

## 2018-01-12 NOTE — Progress Notes (Signed)
Reviewed discharge instructions, medications and prescriptions. Patient verbalizes understanding and has no further questions.

## 2018-01-26 DIAGNOSIS — Z471 Aftercare following joint replacement surgery: Secondary | ICD-10-CM | POA: Diagnosis not present

## 2018-01-26 DIAGNOSIS — Z96641 Presence of right artificial hip joint: Secondary | ICD-10-CM | POA: Diagnosis not present

## 2018-02-26 DIAGNOSIS — Z471 Aftercare following joint replacement surgery: Secondary | ICD-10-CM | POA: Diagnosis not present

## 2018-02-26 DIAGNOSIS — Z96641 Presence of right artificial hip joint: Secondary | ICD-10-CM | POA: Diagnosis not present

## 2018-03-09 ENCOUNTER — Encounter: Payer: Self-pay | Admitting: Family Medicine

## 2018-03-09 ENCOUNTER — Ambulatory Visit (INDEPENDENT_AMBULATORY_CARE_PROVIDER_SITE_OTHER): Payer: Medicare HMO | Admitting: Family Medicine

## 2018-03-09 VITALS — BP 128/90 | HR 83 | Temp 98.3°F | Ht 65.0 in | Wt 153.4 lb

## 2018-03-09 DIAGNOSIS — E782 Mixed hyperlipidemia: Secondary | ICD-10-CM | POA: Diagnosis not present

## 2018-03-09 DIAGNOSIS — R030 Elevated blood-pressure reading, without diagnosis of hypertension: Secondary | ICD-10-CM | POA: Diagnosis not present

## 2018-03-09 DIAGNOSIS — M1611 Unilateral primary osteoarthritis, right hip: Secondary | ICD-10-CM | POA: Diagnosis not present

## 2018-03-09 DIAGNOSIS — Z23 Encounter for immunization: Secondary | ICD-10-CM | POA: Diagnosis not present

## 2018-03-09 DIAGNOSIS — K219 Gastro-esophageal reflux disease without esophagitis: Secondary | ICD-10-CM

## 2018-03-09 LAB — LIPID PANEL
CHOL/HDL RATIO: 4
Cholesterol: 208 mg/dL — ABNORMAL HIGH (ref 0–200)
HDL: 48.7 mg/dL (ref >39.00)
LDL CALC: 138 mg/dL — AB (ref 0–99)
NonHDL: 159.16
Triglycerides: 105 mg/dL (ref 0.0–149.0)
VLDL: 21 mg/dL (ref 0.0–40.0)

## 2018-03-09 LAB — HEPATIC FUNCTION PANEL
ALK PHOS: 62 U/L (ref 39–117)
ALT: 9 U/L (ref 0–35)
AST: 15 U/L (ref 0–37)
Albumin: 3.9 g/dL (ref 3.5–5.2)
BILIRUBIN DIRECT: 0.1 mg/dL (ref 0.0–0.3)
BILIRUBIN TOTAL: 0.5 mg/dL (ref 0.2–1.2)
Total Protein: 7.3 g/dL (ref 6.0–8.3)

## 2018-03-09 LAB — POC URINALSYSI DIPSTICK (AUTOMATED)
BILIRUBIN UA: NEGATIVE
Blood, UA: POSITIVE
GLUCOSE UA: NEGATIVE
Ketones, UA: NEGATIVE
LEUKOCYTES UA: NEGATIVE
Nitrite, UA: NEGATIVE
PH UA: 6 (ref 5.0–8.0)
Protein, UA: NEGATIVE
Spec Grav, UA: >=1.03 — AB (ref 1.010–1.025)
Urobilinogen, UA: 0.2 E.U./dL

## 2018-03-09 LAB — TSH: TSH: 0.71 u[IU]/mL (ref 0.35–4.50)

## 2018-03-09 LAB — CBC WITH DIFFERENTIAL/PLATELET
BASOS ABS: 0 10*3/uL (ref 0.0–0.1)
BASOS PCT: 0.8 % (ref 0.0–3.0)
EOS PCT: 2.1 % (ref 0.0–5.0)
Eosinophils Absolute: 0.1 10*3/uL (ref 0.0–0.7)
HEMATOCRIT: 35.4 % — AB (ref 36.0–46.0)
Hemoglobin: 11.8 g/dL — ABNORMAL LOW (ref 12.0–15.0)
LYMPHS PCT: 34.6 % (ref 12.0–46.0)
Lymphs Abs: 1.2 10*3/uL (ref 0.7–4.0)
MCHC: 33.2 g/dL (ref 30.0–36.0)
MCV: 89.8 fl (ref 78.0–100.0)
MONOS PCT: 6.5 % (ref 3.0–12.0)
Monocytes Absolute: 0.2 10*3/uL (ref 0.1–1.0)
NEUTROS ABS: 2 10*3/uL (ref 1.4–7.7)
Neutrophils Relative %: 56 % (ref 43.0–77.0)
Platelets: 275 10*3/uL (ref 150.0–400.0)
RBC: 3.94 Mil/uL (ref 3.87–5.11)
RDW: 13.5 % (ref 11.5–15.5)
WBC: 3.5 10*3/uL — ABNORMAL LOW (ref 4.0–10.5)

## 2018-03-09 LAB — BASIC METABOLIC PANEL
BUN: 13 mg/dL (ref 6–23)
CHLORIDE: 105 meq/L (ref 96–112)
CO2: 33 mEq/L — ABNORMAL HIGH (ref 19–32)
Calcium: 9.7 mg/dL (ref 8.4–10.5)
Creatinine, Ser: 0.88 mg/dL (ref 0.40–1.20)
GFR: 81.76 mL/min (ref >60.00)
Glucose, Bld: 82 mg/dL (ref 70–99)
POTASSIUM: 4.6 meq/L (ref 3.5–5.1)
SODIUM: 141 meq/L (ref 135–145)

## 2018-03-09 MED ORDER — IBUPROFEN 800 MG PO TABS: 800.0000 mg | ORAL_TABLET | Freq: Four times a day (QID) | ORAL | 5 refills | Status: DC | PRN

## 2018-03-09 NOTE — Progress Notes (Signed)
   Subjective:    Patient ID: Christy Kirby, female    DOB: November 25, 1950, 67 y.o.   MRN: [id number]  HPI Here to follow up on issues. She feels well although she is recovering from hip surgery. She watches her diet. Her osteoarthritis is stable. She takes Tylenol or Ibuprofen as needed.   Review of Systems  Constitutional: Negative.   HENT: Negative.   Eyes: Negative.   Respiratory: Negative.   Cardiovascular: Negative.   Gastrointestinal: Negative.   Genitourinary: Negative for decreased urine volume, difficulty urinating, dyspareunia, dysuria, enuresis, flank pain, frequency, hematuria, pelvic pain and urgency.  Musculoskeletal: Positive for arthralgias and back pain.  Skin: Negative.   Neurological: Negative.   Psychiatric/Behavioral: Negative.        Objective:   Physical Exam  Constitutional: She is oriented to person, place, and time. She appears well-developed and well-nourished. No distress.  HENT:  Head: Normocephalic and atraumatic.  Right Ear: External ear normal.  Left Ear: External ear normal.  Nose: Nose normal.  Mouth/Throat: Oropharynx is clear and moist. No oropharyngeal exudate.  Eyes: Pupils are equal, round, and reactive to light. Conjunctivae and EOM are normal. No scleral icterus.  Neck: Normal range of motion. Neck supple. No JVD present. No thyromegaly present.  Cardiovascular: Normal rate, regular rhythm, normal heart sounds and intact distal pulses. Exam reveals no gallop and no friction rub.  No murmur heard. Pulmonary/Chest: Effort normal and breath sounds normal. No respiratory distress. She has no wheezes. She has no rales. She exhibits no tenderness.  Abdominal: Soft. Bowel sounds are normal. She exhibits no distension and no mass. There is no tenderness. There is no rebound and no guarding.  Musculoskeletal: Normal range of motion. She exhibits no edema or tenderness.  Lymphadenopathy:    She has no cervical adenopathy.  Neurological: She is  alert and oriented to person, place, and time. She has normal reflexes. She displays normal reflexes. No cranial nerve deficit. She exhibits normal muscle tone. Coordination normal.  Skin: Skin is warm and dry. No rash noted. No erythema.  Psychiatric: She has a normal mood and affect. Her behavior is normal. Judgment and thought content normal.          Assessment & Plan:  She is recovering from a hip replacement. Her osteoarthritis is stable. We will get fasting labs today to check lipids, etc. Her BP is stable.  Alysia Penna, MD

## 2018-04-03 ENCOUNTER — Encounter: Payer: Self-pay | Admitting: *Deleted

## 2018-04-09 ENCOUNTER — Telehealth: Payer: Self-pay | Admitting: Family Medicine

## 2018-04-09 NOTE — Telephone Encounter (Signed)
Copied from [place] [id number]. Topic: Quick Communication - See Telephone Encounter >> Apr 09, 2018  9:16 AM Ahmed Prima L wrote: CRM for notification. See Telephone encounter for: 04/09/18.  Patient states she received a letter in the mail to call the office regarding her labs. Please contact pt @ [phone]

## 2018-04-09 NOTE — Telephone Encounter (Signed)
Normal except mildly elevated lipids. Watch the diet closely   Called and spoke with pt and she is aware of lab results.

## 2018-06-05 DIAGNOSIS — Z1231 Encounter for screening mammogram for malignant neoplasm of breast: Secondary | ICD-10-CM | POA: Diagnosis not present

## 2018-06-05 DIAGNOSIS — Z6825 Body mass index (BMI) 25.0-25.9, adult: Secondary | ICD-10-CM | POA: Diagnosis not present

## 2018-06-05 DIAGNOSIS — Z124 Encounter for screening for malignant neoplasm of cervix: Secondary | ICD-10-CM | POA: Diagnosis not present

## 2018-09-03 ENCOUNTER — Telehealth: Payer: Self-pay

## 2018-09-03 NOTE — Telephone Encounter (Signed)
Author phoned pt. to assess interest in scheduling virtual awv. No answer, unable to leave VM d/t full inbox.

## 2018-10-23 DIAGNOSIS — Z96641 Presence of right artificial hip joint: Secondary | ICD-10-CM | POA: Diagnosis not present

## 2018-10-23 DIAGNOSIS — Z471 Aftercare following joint replacement surgery: Secondary | ICD-10-CM | POA: Diagnosis not present

## 2019-02-16 IMAGING — DX DG PORTABLE PELVIS
1 series · 1 of 1 positions shown · non-contrast
Comparison: None.

CLINICAL DATA: Status post right hip replacement

EXAM:
PORTABLE PELVIS 1-2 VIEWS

[pelvis ap]
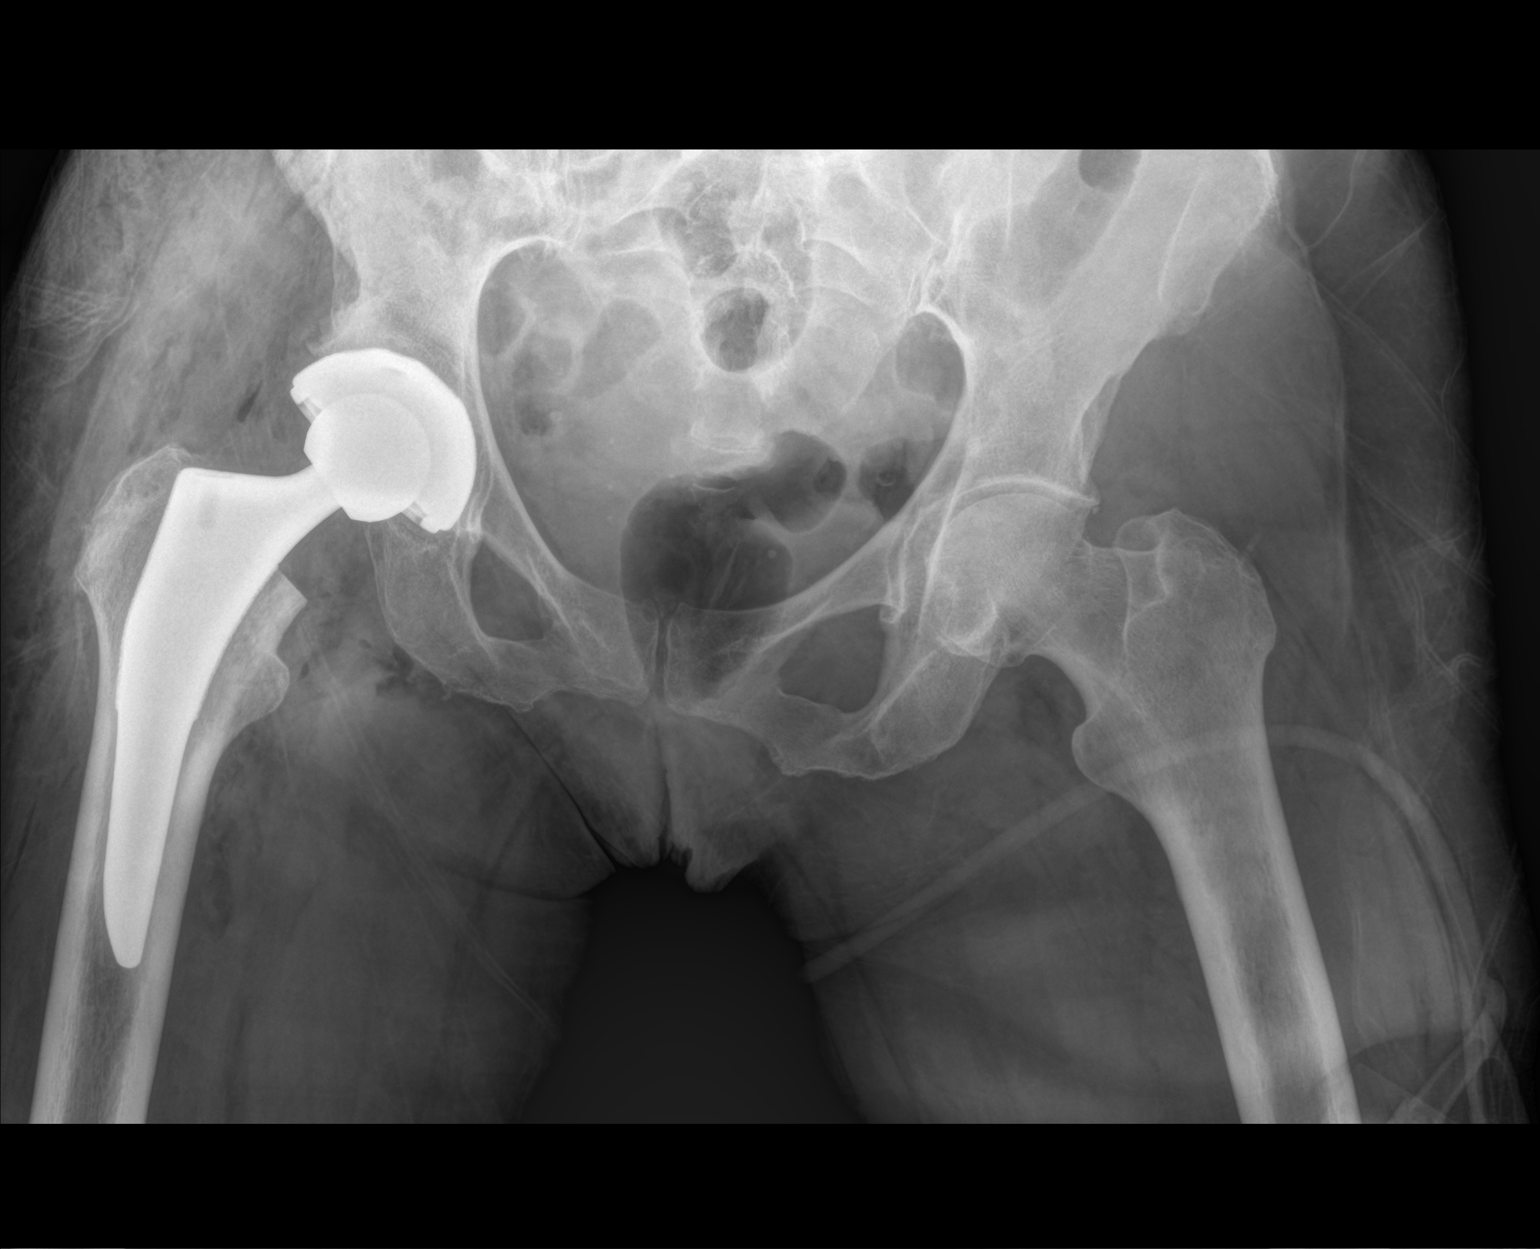

[1 of 1 positions shown; findings below may reference images not displayed]

FINDINGS: Right hip prosthesis is seen in satisfactory position. No acute bony
or soft tissue abnormality is noted.
IMPRESSION: Status post right hip replacement

## 2019-08-13 ENCOUNTER — Other Ambulatory Visit: Payer: Self-pay

## 2019-08-14 ENCOUNTER — Encounter: Payer: Self-pay | Admitting: Family Medicine

## 2019-08-14 ENCOUNTER — Ambulatory Visit (INDEPENDENT_AMBULATORY_CARE_PROVIDER_SITE_OTHER): Payer: Medicare HMO | Admitting: Family Medicine

## 2019-08-14 VITALS — BP 130/70 | Temp 97.8°F | Ht 65.0 in | Wt 148.0 lb

## 2019-08-14 DIAGNOSIS — Z0000 Encounter for general adult medical examination without abnormal findings: Secondary | ICD-10-CM | POA: Diagnosis not present

## 2019-08-14 DIAGNOSIS — Z23 Encounter for immunization: Secondary | ICD-10-CM

## 2019-08-14 LAB — CBC WITH DIFFERENTIAL/PLATELET
Basophils Absolute: 0 10*3/uL (ref 0.0–0.1)
Basophils Relative: 1.1 % (ref 0.0–3.0)
Eosinophils Absolute: 0.1 10*3/uL (ref 0.0–0.7)
Eosinophils Relative: 1.7 % (ref 0.0–5.0)
HCT: 37.1 % (ref 36.0–46.0)
Hemoglobin: 12.3 g/dL (ref 12.0–15.0)
Lymphocytes Relative: 32.8 % (ref 12.0–46.0)
Lymphs Abs: 1.4 10*3/uL (ref 0.7–4.0)
MCHC: 33.1 g/dL (ref 30.0–36.0)
MCV: 91.5 fl (ref 78.0–100.0)
Monocytes Absolute: 0.3 10*3/uL (ref 0.1–1.0)
Monocytes Relative: 7.2 % (ref 3.0–12.0)
Neutro Abs: 2.5 10*3/uL (ref 1.4–7.7)
Neutrophils Relative %: 57.2 % (ref 43.0–77.0)
Platelets: 256 10*3/uL (ref 150.0–400.0)
RBC: 4.06 Mil/uL (ref 3.87–5.11)
RDW: 13.4 % (ref 11.5–15.5)
WBC: 4.3 10*3/uL (ref 4.0–10.5)

## 2019-08-14 LAB — HEPATIC FUNCTION PANEL
ALT: 8 U/L (ref 0–35)
AST: 15 U/L (ref 0–37)
Albumin: 3.9 g/dL (ref 3.5–5.2)
Alkaline Phosphatase: 46 U/L (ref 39–117)
Bilirubin, Direct: 0 mg/dL (ref 0.0–0.3)
Total Bilirubin: 0.3 mg/dL (ref 0.2–1.2)
Total Protein: 7.3 g/dL (ref 6.0–8.3)

## 2019-08-14 LAB — LIPID PANEL
Cholesterol: 238 mg/dL — ABNORMAL HIGH (ref 0–200)
HDL: 60 mg/dL (ref >39.00)
LDL Cholesterol: 161 mg/dL — ABNORMAL HIGH (ref 0–99)
NonHDL: 178.12
Total CHOL/HDL Ratio: 4
Triglycerides: 86 mg/dL (ref 0.0–149.0)
VLDL: 17.2 mg/dL (ref 0.0–40.0)

## 2019-08-14 LAB — BASIC METABOLIC PANEL
BUN: 14 mg/dL (ref 6–23)
CO2: 31 mEq/L (ref 19–32)
Calcium: 9.4 mg/dL (ref 8.4–10.5)
Chloride: 105 mEq/L (ref 96–112)
Creatinine, Ser: 0.78 mg/dL (ref 0.40–1.20)
GFR: 88.05 mL/min (ref >60.00)
Glucose, Bld: 84 mg/dL (ref 70–99)
Potassium: 4.1 mEq/L (ref 3.5–5.1)
Sodium: 140 mEq/L (ref 135–145)

## 2019-08-14 LAB — TSH: TSH: 1.19 u[IU]/mL (ref 0.35–4.50)

## 2019-08-14 MED ORDER — DICLOFENAC SODIUM 75 MG PO TBEC: 75.0000 mg | DELAYED_RELEASE_TABLET | Freq: Two times a day (BID) | ORAL | 11 refills | Status: DC | PRN

## 2019-08-14 NOTE — Progress Notes (Signed)
   Subjective:    Patient ID: Christy Kirby, female    DOB: [birth year], 69 y.o.   MRN: [id number]  HPI Here for a well exam. She feels fine except for occasional arthritis pains. She has been taking Ibuprofen but this no longer works as well as it used to. She will see her GYN next week for a mammogram.   Review of Systems  Constitutional: Negative.   HENT: Negative.   Eyes: Negative.   Respiratory: Negative.   Cardiovascular: Negative.   Gastrointestinal: Negative.   Genitourinary: Negative for decreased urine volume, difficulty urinating, dyspareunia, dysuria, enuresis, flank pain, frequency, hematuria, pelvic pain and urgency.  Musculoskeletal: Positive for arthralgias.  Skin: Negative.   Neurological: Negative.   Psychiatric/Behavioral: Negative.        Objective:   Physical Exam Constitutional:      General: She is not in acute distress.    Appearance: She is well-developed.  HENT:     Head: Normocephalic and atraumatic.     Right Ear: External ear normal.     Left Ear: External ear normal.     Nose: Nose normal.     Mouth/Throat:     Pharynx: No oropharyngeal exudate.  Eyes:     General: No scleral icterus.    Conjunctiva/sclera: Conjunctivae normal.     Pupils: Pupils are equal, round, and reactive to light.  Neck:     Thyroid: No thyromegaly.     Vascular: No JVD.  Cardiovascular:     Rate and Rhythm: Normal rate and regular rhythm.     Heart sounds: Normal heart sounds. No murmur. No friction rub. No gallop.   Pulmonary:     Effort: Pulmonary effort is normal. No respiratory distress.     Breath sounds: Normal breath sounds. No wheezing or rales.  Chest:     Chest wall: No tenderness.  Abdominal:     General: Bowel sounds are normal. There is no distension.     Palpations: Abdomen is soft. There is no mass.     Tenderness: There is no abdominal tenderness. There is no guarding or rebound.  Musculoskeletal:        General: No tenderness. Normal range of  motion.     Cervical back: Normal range of motion and neck supple.  Lymphadenopathy:     Cervical: No cervical adenopathy.  Skin:    General: Skin is warm and dry.     Findings: No erythema or rash.  Neurological:     Mental Status: She is alert and oriented to person, place, and time.     Cranial Nerves: No cranial nerve deficit.     Motor: No abnormal muscle tone.     Coordination: Coordination normal.     Deep Tendon Reflexes: Reflexes are normal and symmetric. Reflexes normal.  Psychiatric:        Behavior: Behavior normal.        Thought Content: Thought content normal.        Judgment: Judgment normal.           Assessment & Plan:  Well exam. We discussed diet and exercise. Get fasting labs. I encouraged her to get the Covid vaccine asap.  Alysia Penna, MD

## 2019-08-14 NOTE — Addendum Note (Signed)
Addended by: Rebecca Eaton on: 08/14/2019 10:01 AM   Modules accepted: Orders

## 2019-08-16 ENCOUNTER — Telehealth: Payer: Self-pay | Admitting: Family Medicine

## 2019-08-16 NOTE — Telephone Encounter (Signed)
Pt was returning Rachel's call about lab results

## 2019-08-16 NOTE — Telephone Encounter (Signed)
Returned call to patient and gave lab results.

## 2019-08-19 DIAGNOSIS — Z1231 Encounter for screening mammogram for malignant neoplasm of breast: Secondary | ICD-10-CM | POA: Diagnosis not present

## 2019-08-19 DIAGNOSIS — Z6824 Body mass index (BMI) 24.0-24.9, adult: Secondary | ICD-10-CM | POA: Diagnosis not present

## 2019-08-19 DIAGNOSIS — Z01419 Encounter for gynecological examination (general) (routine) without abnormal findings: Secondary | ICD-10-CM | POA: Diagnosis not present

## 2020-01-15 DIAGNOSIS — H5203 Hypermetropia, bilateral: Secondary | ICD-10-CM | POA: Diagnosis not present

## 2020-01-15 DIAGNOSIS — H25013 Cortical age-related cataract, bilateral: Secondary | ICD-10-CM | POA: Diagnosis not present

## 2020-01-15 DIAGNOSIS — Z0100 Encounter for examination of eyes and vision without abnormal findings: Secondary | ICD-10-CM | POA: Diagnosis not present

## 2020-01-22 ENCOUNTER — Ambulatory Visit (INDEPENDENT_AMBULATORY_CARE_PROVIDER_SITE_OTHER): Payer: Medicare HMO

## 2020-01-22 ENCOUNTER — Other Ambulatory Visit: Payer: Self-pay

## 2020-01-22 DIAGNOSIS — Z0000 Encounter for general adult medical examination without abnormal findings: Secondary | ICD-10-CM | POA: Diagnosis not present

## 2020-01-22 NOTE — Progress Notes (Signed)
Subjective:   Christy Kirby is a 69 y.o. female who presents for Medicare Annual (Subsequent) preventive examination.  I connected with Christy Kirby  today by telephone and verified that I am speaking with the correct person using two identifiers. Location patient: home Location provider: work Persons participating in the virtual visit: patient, provider.   I discussed the limitations, risks, security and privacy concerns of performing an evaluation and management service by telephone and the availability of in person appointments. I also discussed with the patient that there may be a patient responsible charge related to this service. The patient expressed understanding and verbally consented to this telephonic visit.    Interactive audio and video telecommunications were attempted between this provider and patient, however failed, due to patient having technical difficulties OR patient did not have access to video capability.  We continued and completed visit with audio only.      Review of Systems    N/A Cardiac Risk Factors include: advanced age (>38men, >50 women);dyslipidemia     Objective:    Today's Vitals   There is no height or weight on file to calculate BMI.  Advanced Directives 01/22/2020 01/11/2018 01/02/2018 06/26/2017 03/17/2017  Does Patient Have a Medical Advance Directive? No No No No No  Would patient like information on creating a medical advance directive? No - Patient declined No - Patient declined No - Patient declined - -    Current Medications (verified) Outpatient Encounter Medications as of 01/22/2020  Medication Sig  . aspirin 81 MG chewable tablet Chew 1 tablet (81 mg total) by mouth 2 (two) times daily.  . diclofenac (VOLTAREN) 75 MG EC tablet Take 1 tablet (75 mg total) by mouth 2 (two) times daily as needed (pain). (Patient not taking: Reported on 01/22/2020)  . docusate sodium (COLACE) 100 MG capsule Take 1 capsule (100 mg total) by mouth 2 (two)  times daily. (Patient not taking: Reported on 01/22/2020)  . Multiple Vitamins-Minerals (MULTIVITAMIN WITH MINERALS) tablet Take 1 tablet by mouth daily. (Patient not taking: Reported on 01/22/2020)  . [DISCONTINUED] HYDROcodone-acetaminophen (NORCO/VICODIN) 5-325 MG tablet Take 1-2 tablets by mouth every 6 (six) hours as needed (postop hip pain).  . [DISCONTINUED] ondansetron (ZOFRAN) 4 MG tablet Take 1 tablet (4 mg total) by mouth every 6 (six) hours as needed for nausea.  . [DISCONTINUED] senna (SENOKOT) 8.6 MG TABS tablet Take 1 tablet (8.6 mg total) by mouth 2 (two) times daily.   No facility-administered encounter medications on file as of 01/22/2020.    Allergies (verified) Codeine   History: Past Medical History:  Diagnosis Date  . Arthritis    hip  . GERD (gastroesophageal reflux disease)   . Hyperlipidemia    Past Surgical History:  Procedure Laterality Date  . COLONOSCOPY  06/26/2017   per Dr. Henrene Pastor, benign polyps, repeat in 10 yrs   . NEUROPLASTY / TRANSPOSITION MEDIAN NERVE AT CARPAL TUNNEL BILATERAL    . POLYPECTOMY    . TOTAL HIP ARTHROPLASTY Right 01/11/2018   Procedure: RIGHT TOTAL HIP ARTHROPLASTY ANTERIOR APPROACH;  Surgeon: Rod Can, MD;  Location: [facility];  Service: Orthopedics;  Laterality: Right;   Family History  Problem Relation Age of Onset  . Diabetes Sister   . Colon cancer Neg Hx   . Colon polyps Neg Hx   . Esophageal cancer Neg Hx   . Rectal cancer Neg Hx   . Stomach cancer Neg Hx    Social History   Socioeconomic History  .  Marital status: Married    Spouse name: Not on file  . Number of children: Not on file  . Years of education: Not on file  . Highest education level: Not on file  Occupational History  . Not on file  Tobacco Use  . Smoking status: Former Smoker    Types: Cigarettes  . Smokeless tobacco: Never Used  . Tobacco comment: only when 16 briefly   Vaping Use  . Vaping Use: Never used  Substance and Sexual Activity  .  Alcohol use: Yes    Alcohol/week: 0.0 standard drinks    Comment: occ wine   . Drug use: No  . Sexual activity: Not on file  Other Topics Concern  . Not on file  Social History Narrative  . Not on file   Social Determinants of Health   Financial Resource Strain: Low Risk   . Difficulty of Paying Living Expenses: Not hard at all  Food Insecurity: No Food Insecurity  . Worried About Charity fundraiser in the Last Year: Never true  . Ran Out of Food in the Last Year: Never true  Transportation Needs: No Transportation Needs  . Lack of Transportation (Medical): No  . Lack of Transportation (Non-Medical): No  Physical Activity: Inactive  . Days of Exercise per Week: 0 days  . Minutes of Exercise per Session: 0 min  Stress: No Stress Concern Present  . Feeling of Stress : Not at all  Social Connections: Moderately Isolated  . Frequency of Communication with Friends and Family: More than three times a week  . Frequency of Social Gatherings with Friends and Family: More than three times a week  . Attends Religious Services: More than 4 times per year  . Active Member of Clubs or Organizations: No  . Attends Archivist Meetings: Never  . Marital Status: Separated    Tobacco Counseling Counseling given: Not Answered Comment: only when 16 briefly    Clinical Intake:  Pre-visit preparation completed: Yes  Pain : No/denies pain     Nutritional Risks: None Diabetes: No  How often do you need to have someone help you when you read instructions, pamphlets, or other written materials from your doctor or pharmacy?: 1 - Never What is the last grade level you completed in school?: 11th grade  Diabetic?No  Interpreter Needed?: No  Information entered by :: [place] of Daily Living In your present state of health, do you have any difficulty performing the following activities: 01/22/2020  Hearing? Y  Comment Sometimes has hard time determining which  direction a sound came from  Vision? N  Difficulty concentrating or making decisions? N  Walking or climbing stairs? N  Dressing or bathing? N  Doing errands, shopping? N  Preparing Food and eating ? N  Using the Toilet? N  In the past six months, have you accidently leaked urine? N  Do you have problems with loss of bowel control? N  Managing your Medications? N  Managing your Finances? N  Housekeeping or managing your Housekeeping? N  Some recent data might be hidden    Patient Care Team: Laurey Morale, MD as PCP - General  Indicate any recent Medical Services you may have received from other than [facility] providers in the past year (date may be approximate).     Assessment:   This is a routine wellness examination for Cheril.  Hearing/Vision screen  Hearing Screening   125Hz  250Hz  500Hz  1000Hz  2000Hz  3000Hz  4000Hz   6000Hz  8000Hz   Right ear:           Left ear:           Vision Screening Comments: Patient states gets eye examined yearly.   Dietary issues and exercise activities discussed: Current Exercise Habits: The patient does not participate in regular exercise at present, Exercise limited by: None identified  Goals    . patient     Would like the pain to cease in hip     . Patient Stated     I would like to my cholesterol through my diet       Depression Screen PHQ 2/9 Scores 01/22/2020 03/09/2018 03/17/2017 11/14/2014  PHQ - 2 Score 0 0 0 0  PHQ- 9 Score 0 - - -    Fall Risk Fall Risk  01/22/2020 03/09/2018 03/17/2017 11/14/2014  Falls in the past year? 0 No No No  Number falls in past yr: 0 - - -  Injury with Fall? 0 - - -  Risk for fall due to : No Fall Risks - - -  Follow up Falls evaluation completed;Falls prevention discussed - - -    Any stairs in or around the home? Yes  If so, are there any without handrails? No  Home free of loose throw rugs in walkways, pet beds, electrical cords, etc? Yes  Adequate lighting in your home to reduce risk of falls?  Yes   ASSISTIVE DEVICES UTILIZED TO PREVENT FALLS:  Life alert? No  Use of a cane, walker or w/c? No  Grab bars in the bathroom? No  Shower chair or bench in shower? No  Elevated toilet seat or a handicapped toilet? No    Cognitive Function:     6CIT Screen 01/22/2020  What Year? 0 points  What month? 0 points  What time? 0 points  Count back from 20 0 points  Months in reverse 0 points  Repeat phrase 2 points  Total Score 2    Immunizations Immunization History  Administered Date(s) Administered  . Pneumococcal Conjugate-13 03/09/2018  . Pneumococcal Polysaccharide-23 08/14/2019    TDAP status: Due, Education has been provided regarding the importance of this vaccine. Advised may receive this vaccine at local pharmacy or Health Dept. Aware to provide a copy of the vaccination record if obtained from local pharmacy or Health Dept. Verbalized acceptance and understanding. Flu Vaccine status: Up to date Pneumococcal vaccine status: Up to date Covid-19 vaccine status: Completed vaccines  Qualifies for Shingles Vaccine? Yes   Zostavax completed No   Shingrix Completed?: No.    Education has been provided regarding the importance of this vaccine. Patient has been advised to call insurance company to determine out of pocket expense if they have not yet received this vaccine. Advised may also receive vaccine at local pharmacy or Health Dept. Verbalized acceptance and understanding.  Screening Tests Health Maintenance  Topic Date Due  . Hepatitis C Screening  Never done  . COVID-19 Vaccine (1) Never done  . TETANUS/TDAP  Never done  . MAMMOGRAM  Never done  . DEXA SCAN  Never done  . INFLUENZA VACCINE  01/05/2020  . COLONOSCOPY  06/27/2027  . PNA vac Low Risk Adult  Completed    Health Maintenance  Health Maintenance Due  Topic Date Due  . Hepatitis C Screening  Never done  . COVID-19 Vaccine (1) Never done  . TETANUS/TDAP  Never done  . MAMMOGRAM  Never done  .  DEXA SCAN  Never  done  . INFLUENZA VACCINE  01/05/2020    Colorectal cancer screening: Completed 06/26/2017. Repeat every 10 years Mammogram status: Ordered 01/22/2020. Pt provided with contact info and advised to call to schedule appt.  Bone Density status: Ordered 01/22/2020. Pt provided with contact info and advised to call to schedule appt.  Lung Cancer Screening: (Low Dose CT Chest recommended if Age 110-80 years, 30 pack-year currently smoking OR have quit w/in 15years.) does not qualify.   Lung Cancer Screening Referral: N/A  Additional Screening:  Hepatitis C Screening: does qualify;   Vision Screening: Recommended annual ophthalmology exams for early detection of glaucoma and other disorders of the eye. Is the patient up to date with their annual eye exam?  Yes  Who is the provider or what is the name of the office in which the patient attends annual eye exams? [facility] If pt is not established with a provider, would they like to be referred to a provider to establish care? No .   Dental Screening: Recommended annual dental exams for proper oral hygiene  Community Resource Referral / Chronic Care Management: CRR required this visit?  No   CCM required this visit?  No      Plan:     I have personally reviewed and noted the following in the patient's chart:   . Medical and social history . Use of alcohol, tobacco or illicit drugs  . Current medications and supplements . Functional ability and status . Nutritional status . Physical activity . Advanced directives . List of other physicians . Hospitalizations, surgeries, and ER visits in previous 12 months . Vitals . Screenings to include cognitive, depression, and falls . Referrals and appointments  In addition, I have reviewed and discussed with patient certain preventive protocols, quality metrics, and best practice recommendations. A written personalized care plan for preventive services as well as  general preventive health recommendations were provided to patient.     Ofilia Neas, LPN   [id number]   Nurse Notes: None

## 2020-01-22 NOTE — Patient Instructions (Signed)
[name] , Thank you for taking time to come for your Medicare Wellness Visit. I appreciate your ongoing commitment to your health goals. Please review the following plan we discussed and let me know if I can assist you in the future.   Screening recommendations/referrals: Colonoscopy: Up to date, next due 06/27/2027 Mammogram: please have your GYN fax [place] over copies of your mammogram results. Bone Density: Please have your GYN fax [place] over your Bone Density results. Recommended yearly ophthalmology/optometry visit for glaucoma screening and checkup Recommended yearly dental visit for hygiene and checkup  Vaccinations: Influenza vaccine: Patient declined states does not receive flu vaccines Pneumococcal vaccine: Completed series  Tdap vaccine: Currently due, you may contact your insurance company to discuss cost or await and injury to receive  Shingles vaccine: Currently due, please contact either your pharmacy or insurance company to discuss cost. You may receive this vaccine at your pharmacy     Advanced directives: Advance directive discussed with you today. Even though you declined this today please call our office should you change your mind and we can give you the proper paperwork for you to fill out.   Conditions/risks identified: None   Next appointment: 01/25/2021 @ 9:00 am with [facility] advisor    Preventive Care 65 Years and Older, Female Preventive care refers to lifestyle choices and visits with your health care provider that can promote health and wellness. What does preventive care include?  A yearly physical exam. This is also called an annual well check.  Dental exams once or twice a year.  Routine eye exams. Ask your health care provider how often you should have your eyes checked.  Personal lifestyle choices, including:  Daily care of your teeth and gums.  Regular physical activity.  Eating a healthy diet.  Avoiding tobacco and drug  use.  Limiting alcohol use.  Practicing safe sex.  Taking low-dose aspirin every day.  Taking vitamin and mineral supplements as recommended by your health care provider. What happens during an annual well check? The services and screenings done by your health care provider during your annual well check will depend on your age, overall health, lifestyle risk factors, and family history of disease. Counseling  Your health care provider may ask you questions about your:  Alcohol use.  Tobacco use.  Drug use.  Emotional well-being.  Home and relationship well-being.  Sexual activity.  Eating habits.  History of falls.  Memory and ability to understand (cognition).  Work and work Statistician.  Reproductive health. Screening  You may have the following tests or measurements:  Height, weight, and BMI.  Blood pressure.  Lipid and cholesterol levels. These may be checked every 5 years, or more frequently if you are over 33 years old.  Skin check.  Lung cancer screening. You may have this screening every year starting at age 66 if you have a 30-pack-year history of smoking and currently smoke or have quit within the past 15 years.  Fecal occult blood test (FOBT) of the stool. You may have this test every year starting at age 70.  Flexible sigmoidoscopy or colonoscopy. You may have a sigmoidoscopy every 5 years or a colonoscopy every 10 years starting at age 51.  Hepatitis C blood test.  Hepatitis B blood test.  Sexually transmitted disease (STD) testing.  Diabetes screening. This is done by checking your blood sugar (glucose) after you have not eaten for a while (fasting). You may have this done every 1-3 years.  Bone density scan. This is done to screen for osteoporosis. You may have this done starting at age 32.  Mammogram. This may be done every 1-2 years. Talk to your health care provider about how often you should have regular mammograms. Talk with your  health care provider about your test results, treatment options, and if necessary, the need for more tests. Vaccines  Your health care provider may recommend certain vaccines, such as:  Influenza vaccine. This is recommended every year.  Tetanus, diphtheria, and acellular pertussis (Tdap, Td) vaccine. You may need a Td booster every 10 years.  Zoster vaccine. You may need this after age 101.  Pneumococcal 13-valent conjugate (PCV13) vaccine. One dose is recommended after age 31.  Pneumococcal polysaccharide (PPSV23) vaccine. One dose is recommended after age 73. Talk to your health care provider about which screenings and vaccines you need and how often you need them. This information is not intended to replace advice given to you by your health care provider. Make sure you discuss any questions you have with your health care provider. Document Released: 06/19/2015 Document Revised: 02/10/2016 Document Reviewed: 03/24/2015 Elsevier Interactive Patient Education  2017 [place] Prevention in the Home Falls can cause injuries. They can happen to people of all ages. There are many things you can do to make your home safe and to help prevent falls. What can I do on the outside of my home?  Regularly fix the edges of walkways and driveways and fix any cracks.  Remove anything that might make you trip as you walk through a door, such as a raised step or threshold.  Trim any bushes or trees on the path to your home.  Use bright outdoor lighting.  Clear any walking paths of anything that might make someone trip, such as rocks or tools.  Regularly check to see if handrails are loose or broken. Make sure that both sides of any steps have handrails.  Any raised decks and porches should have guardrails on the edges.  Have any leaves, snow, or ice cleared regularly.  Use sand or salt on walking paths during winter.  Clean up any spills in your garage right away. This includes oil  or grease spills. What can I do in the bathroom?  Use night lights.  Install grab bars by the toilet and in the tub and shower. Do not use towel bars as grab bars.  Use non-skid mats or decals in the tub or shower.  If you need to sit down in the shower, use a plastic, non-slip stool.  Keep the floor dry. Clean up any water that spills on the floor as soon as it happens.  Remove soap buildup in the tub or shower regularly.  Attach bath mats securely with double-sided non-slip rug tape.  Do not have throw rugs and other things on the floor that can make you trip. What can I do in the bedroom?  Use night lights.  Make sure that you have a light by your bed that is easy to reach.  Do not use any sheets or blankets that are too big for your bed. They should not hang down onto the floor.  Have a firm chair that has side arms. You can use this for support while you get dressed.  Do not have throw rugs and other things on the floor that can make you trip. What can I do in the kitchen?  Clean up any spills right away.  Avoid walking  on wet floors.  Keep items that you use a lot in easy-to-reach places.  If you need to reach something above you, use a strong step stool that has a grab bar.  Keep electrical cords out of the way.  Do not use floor polish or wax that makes floors slippery. If you must use wax, use non-skid floor wax.  Do not have throw rugs and other things on the floor that can make you trip. What can I do with my stairs?  Do not leave any items on the stairs.  Make sure that there are handrails on both sides of the stairs and use them. Fix handrails that are broken or loose. Make sure that handrails are as long as the stairways.  Check any carpeting to make sure that it is firmly attached to the stairs. Fix any carpet that is loose or worn.  Avoid having throw rugs at the top or bottom of the stairs. If you do have throw rugs, attach them to the floor with  carpet tape.  Make sure that you have a light switch at the top of the stairs and the bottom of the stairs. If you do not have them, ask someone to add them for you. What else can I do to help prevent falls?  Wear shoes that:  Do not have high heels.  Have rubber bottoms.  Are comfortable and fit you well.  Are closed at the toe. Do not wear sandals.  If you use a stepladder:  Make sure that it is fully opened. Do not climb a closed stepladder.  Make sure that both sides of the stepladder are locked into place.  Ask someone to hold it for you, if possible.  Clearly mark and make sure that you can see:  Any grab bars or handrails.  First and last steps.  Where the edge of each step is.  Use tools that help you move around (mobility aids) if they are needed. These include:  Canes.  Walkers.  Scooters.  Crutches.  Turn on the lights when you go into a dark area. Replace any light bulbs as soon as they burn out.  Set up your furniture so you have a clear path. Avoid moving your furniture around.  If any of your floors are uneven, fix them.  If there are any pets around you, be aware of where they are.  Review your medicines with your doctor. Some medicines can make you feel dizzy. This can increase your chance of falling. Ask your doctor what other things that you can do to help prevent falls. This information is not intended to replace advice given to you by your health care provider. Make sure you discuss any questions you have with your health care provider. Document Released: 03/19/2009 Document Revised: 10/29/2015 Document Reviewed: 06/27/2014 Elsevier Interactive Patient Education  2017 [employer].

## 2020-09-30 ENCOUNTER — Other Ambulatory Visit: Payer: Self-pay

## 2020-09-30 ENCOUNTER — Encounter: Payer: Self-pay | Admitting: Family Medicine

## 2020-09-30 ENCOUNTER — Ambulatory Visit (INDEPENDENT_AMBULATORY_CARE_PROVIDER_SITE_OTHER): Payer: Medicare HMO | Admitting: Family Medicine

## 2020-09-30 VITALS — BP 170/100 | HR 74 | Temp 97.9°F | Ht 65.0 in | Wt 140.0 lb

## 2020-09-30 DIAGNOSIS — I10 Essential (primary) hypertension: Secondary | ICD-10-CM

## 2020-09-30 DIAGNOSIS — Z0000 Encounter for general adult medical examination without abnormal findings: Secondary | ICD-10-CM

## 2020-09-30 LAB — HEPATIC FUNCTION PANEL
ALT: 9 U/L (ref 0–35)
AST: 15 U/L (ref 0–37)
Albumin: 4 g/dL (ref 3.5–5.2)
Alkaline Phosphatase: 50 U/L (ref 39–117)
Bilirubin, Direct: 0 mg/dL (ref 0.0–0.3)
Total Bilirubin: 0.4 mg/dL (ref 0.2–1.2)
Total Protein: 7.3 g/dL (ref 6.0–8.3)

## 2020-09-30 LAB — TSH: TSH: 1.17 u[IU]/mL (ref 0.35–4.50)

## 2020-09-30 LAB — BASIC METABOLIC PANEL
BUN: 17 mg/dL (ref 6–23)
CO2: 27 mEq/L (ref 19–32)
Calcium: 9.6 mg/dL (ref 8.4–10.5)
Chloride: 104 mEq/L (ref 96–112)
Creatinine, Ser: 0.94 mg/dL (ref 0.40–1.20)
GFR: 60.71 mL/min (ref >60.00)
Glucose, Bld: 86 mg/dL (ref 70–99)
Potassium: 4.3 mEq/L (ref 3.5–5.1)
Sodium: 139 mEq/L (ref 135–145)

## 2020-09-30 LAB — CBC WITH DIFFERENTIAL/PLATELET
Basophils Absolute: 0 10*3/uL (ref 0.0–0.1)
Basophils Relative: 0.7 % (ref 0.0–3.0)
Eosinophils Absolute: 0.1 10*3/uL (ref 0.0–0.7)
Eosinophils Relative: 1.8 % (ref 0.0–5.0)
HCT: 37 % (ref 36.0–46.0)
Hemoglobin: 12.4 g/dL (ref 12.0–15.0)
Lymphocytes Relative: 31 % (ref 12.0–46.0)
Lymphs Abs: 1.1 10*3/uL (ref 0.7–4.0)
MCHC: 33.5 g/dL (ref 30.0–36.0)
MCV: 91.7 fl (ref 78.0–100.0)
Monocytes Absolute: 0.2 10*3/uL (ref 0.1–1.0)
Monocytes Relative: 6.9 % (ref 3.0–12.0)
Neutro Abs: 2.1 10*3/uL (ref 1.4–7.7)
Neutrophils Relative %: 59.6 % (ref 43.0–77.0)
Platelets: 254 10*3/uL (ref 150.0–400.0)
RBC: 4.03 Mil/uL (ref 3.87–5.11)
RDW: 13.9 % (ref 11.5–15.5)
WBC: 3.5 10*3/uL — ABNORMAL LOW (ref 4.0–10.5)

## 2020-09-30 LAB — T4, FREE: Free T4: 0.66 ng/dL (ref 0.60–1.60)

## 2020-09-30 LAB — URINALYSIS
Bilirubin Urine: NEGATIVE
Hgb urine dipstick: NEGATIVE
Ketones, ur: NEGATIVE
Leukocytes,Ua: NEGATIVE
Nitrite: NEGATIVE
Specific Gravity, Urine: 1.01 (ref 1.000–1.030)
Total Protein, Urine: NEGATIVE
Urine Glucose: NEGATIVE
Urobilinogen, UA: 0.2 (ref 0.0–1.0)
pH: 6.5 (ref 5.0–8.0)

## 2020-09-30 LAB — LIPID PANEL
Cholesterol: 219 mg/dL — ABNORMAL HIGH (ref 0–200)
HDL: 56.3 mg/dL (ref >39.00)
LDL Cholesterol: 149 mg/dL — ABNORMAL HIGH (ref 0–99)
NonHDL: 163.11
Total CHOL/HDL Ratio: 4
Triglycerides: 70 mg/dL (ref 0.0–149.0)
VLDL: 14 mg/dL (ref 0.0–40.0)

## 2020-09-30 LAB — HEMOGLOBIN A1C: Hgb A1c MFr Bld: 5.7 % (ref 4.6–6.5)

## 2020-09-30 LAB — T3, FREE: T3, Free: 3.3 pg/mL (ref 2.3–4.2)

## 2020-09-30 MED ORDER — IBUPROFEN 800 MG PO TABS: 800.0000 mg | ORAL_TABLET | Freq: Four times a day (QID) | ORAL | 3 refills | Status: DC | PRN

## 2020-09-30 NOTE — Progress Notes (Signed)
Subjective:    Patient ID: Christy Kirby, female    DOB: 02/24/1951, 70 y.o.   MRN: [id number]  HPI Here for a well exam. She feels fine except for her usual back pain. She wants to stop Diclofenac and go back to Ibuprofen as needed. She has been under a lot of stress for the past 2 months because her 77 year old daughter had a massive stroke, and she has been awake but unresponsive since it happened. Christy Kirby has been spending time every day in the hospital with her.    Review of Systems  Constitutional: Negative.   HENT: Negative.   Eyes: Negative.   Respiratory: Negative.   Cardiovascular: Negative.   Gastrointestinal: Negative.   Genitourinary: Negative for decreased urine volume, difficulty urinating, dyspareunia, dysuria, enuresis, flank pain, frequency, hematuria, pelvic pain and urgency.  Musculoskeletal: Positive for back pain.  Skin: Negative.   Neurological: Negative.   Psychiatric/Behavioral: Negative.        Objective:   Physical Exam Constitutional:      General: She is not in acute distress.    Appearance: Normal appearance. She is well-developed.  HENT:     Head: Normocephalic and atraumatic.     Right Ear: External ear normal.     Left Ear: External ear normal.     Nose: Nose normal.     Mouth/Throat:     Pharynx: No oropharyngeal exudate.  Eyes:     General: No scleral icterus.    Conjunctiva/sclera: Conjunctivae normal.     Pupils: Pupils are equal, round, and reactive to light.  Neck:     Thyroid: No thyromegaly.     Vascular: No JVD.  Cardiovascular:     Rate and Rhythm: Normal rate and regular rhythm.     Heart sounds: Normal heart sounds. No murmur heard. No friction rub. No gallop.   Pulmonary:     Effort: Pulmonary effort is normal. No respiratory distress.     Breath sounds: Normal breath sounds. No wheezing or rales.  Chest:     Chest wall: No tenderness.  Abdominal:     General: Bowel sounds are normal. There is no distension.      Palpations: Abdomen is soft. There is no mass.     Tenderness: There is no abdominal tenderness. There is no guarding or rebound.  Musculoskeletal:        General: No tenderness. Normal range of motion.     Cervical back: Normal range of motion and neck supple.  Lymphadenopathy:     Cervical: No cervical adenopathy.  Skin:    General: Skin is warm and dry.     Findings: No erythema or rash.  Neurological:     Mental Status: She is alert and oriented to person, place, and time.     Cranial Nerves: No cranial nerve deficit.     Motor: No abnormal muscle tone.     Coordination: Coordination normal.     Deep Tendon Reflexes: Reflexes are normal and symmetric. Reflexes normal.  Psychiatric:        Behavior: Behavior normal.        Thought Content: Thought content normal.        Judgment: Judgment normal.           Assessment & Plan:  Well exam. We discussed diet and exercise. Get fasting labs. We spent a long time talking about her high BP, and I strongly encouraged her to start on medication for this. However she is  very reluctant to do this, so we agreed that she will reduce her intake of sodium and she will come back in one month for a follow up visit.  Christy Penna, MD

## 2020-10-01 DIAGNOSIS — Z6822 Body mass index (BMI) 22.0-22.9, adult: Secondary | ICD-10-CM | POA: Diagnosis not present

## 2020-10-01 DIAGNOSIS — Z1231 Encounter for screening mammogram for malignant neoplasm of breast: Secondary | ICD-10-CM | POA: Diagnosis not present

## 2020-10-01 DIAGNOSIS — Z124 Encounter for screening for malignant neoplasm of cervix: Secondary | ICD-10-CM | POA: Diagnosis not present

## 2020-10-30 ENCOUNTER — Ambulatory Visit (INDEPENDENT_AMBULATORY_CARE_PROVIDER_SITE_OTHER): Payer: Medicare HMO | Admitting: Family Medicine

## 2020-10-30 ENCOUNTER — Encounter: Payer: Self-pay | Admitting: Family Medicine

## 2020-10-30 ENCOUNTER — Other Ambulatory Visit: Payer: Self-pay

## 2020-10-30 VITALS — BP 128/90 | HR 76 | Temp 98.0°F | Wt 139.0 lb

## 2020-10-30 DIAGNOSIS — I10 Essential (primary) hypertension: Secondary | ICD-10-CM | POA: Diagnosis not present

## 2020-10-30 NOTE — Progress Notes (Signed)
   Subjective:    Patient ID: Christy Kirby, female    DOB: 09-15-1950, 70 y.o.   MRN: [id number]  HPI Here to follow up on high BP. One month ago she was here for a well exam and her BP was 170/100. She explained that she had been under a lot of stress at that time because her daughter had had a massive stroke, and she had been spending all her time with her at the hospital. We agreed that she would change her diet and try to get the BP down without medications. Since then she has tried to get more sleep and eat better. She still spends her days at the hospital, and she says her daughter has made some slight improvements.  Review of Systems  Constitutional: Negative.   Respiratory: Negative.   Cardiovascular: Negative.        Objective:   Physical Exam Constitutional:      Appearance: Normal appearance.  Cardiovascular:     Rate and Rhythm: Normal rate and regular rhythm.     Pulses: Normal pulses.     Heart sounds: Normal heart sounds.  Pulmonary:     Effort: Pulmonary effort is normal.     Breath sounds: Normal breath sounds.  Neurological:     Mental Status: She is alert.           Assessment & Plan:  Elevated BP. The BP looks a lot better today. She will continue to work on this and to monitor the BP at home. Recheck in 90 days.  Alysia Penna, MD

## 2021-01-25 ENCOUNTER — Ambulatory Visit: Payer: Medicare HMO

## 2021-01-27 ENCOUNTER — Other Ambulatory Visit: Payer: Self-pay | Admitting: Family Medicine

## 2021-02-01 ENCOUNTER — Telehealth: Payer: Self-pay | Admitting: Family Medicine

## 2021-02-01 NOTE — Telephone Encounter (Signed)
Left message for patient to call back and schedule Medicare Annual Wellness Visit (AWV) either virtually or in office. Left  my jabber number [phone]   Last AWV 01/22/20  please schedule at anytime with [facility] Nurse Health Advisor 1 or 2   This should be a 45 minute visit.  

## 2021-03-09 ENCOUNTER — Other Ambulatory Visit: Payer: Self-pay

## 2021-03-09 ENCOUNTER — Ambulatory Visit (INDEPENDENT_AMBULATORY_CARE_PROVIDER_SITE_OTHER): Payer: Medicare HMO

## 2021-03-09 DIAGNOSIS — Z0000 Encounter for general adult medical examination without abnormal findings: Secondary | ICD-10-CM

## 2021-03-09 NOTE — Patient Instructions (Signed)
Christy Kirby , Thank you for taking time to come for your Medicare Wellness Visit. I appreciate your ongoing commitment to your health goals. Please review the following plan we discussed and let me know if I can assist you in the future.   Screening recommendations/referrals: Colonoscopy: Done 06/26/17 repeat every 10 years due 06/27/27 Mammogram: pt will make appt with dr Corinna Capra  Recommended yearly ophthalmology/optometry visit for glaucoma screening and checkup Recommended yearly dental visit for hygiene and checkup  Vaccinations: Influenza vaccine: Due and dicussed Pneumococcal vaccine: completed  Tdap vaccine: Due  Shingles vaccine: Shingrix discussed. Please contact your pharmacy for coverage information.    Covid-19:C  Advanced directives: 5/17, 11/18/19 & 06/19/20  Conditions/risks identified: None at this time  Next appointment: Follow up in one year for your annual wellness visit    Preventive Care 72 Years and Older, Female Preventive care refers to lifestyle choices and visits with your health care provider that can promote health and wellness. What does preventive care include? A yearly physical exam. This is also called an annual well check. Dental exams once or twice a year. Routine eye exams. Ask your health care provider how often you should have your eyes checked. Personal lifestyle choices, including: Daily care of your teeth and gums. Regular physical activity. Eating a healthy diet. Avoiding tobacco and drug use. Limiting alcohol use. Practicing safe sex. Taking low-dose aspirin every day. Taking vitamin and mineral supplements as recommended by your health care provider. What happens during an annual well check? The services and screenings done by your health care provider during your annual well check will depend on your age, overall health, lifestyle risk factors, and family history of disease. Counseling  Your health care provider may ask you questions about  your: Alcohol use. Tobacco use. Drug use. Emotional well-being. Home and relationship well-being. Sexual activity. Eating habits. History of falls. Memory and ability to understand (cognition). Work and work Statistician. Reproductive health. Screening  You may have the following tests or measurements: Height, weight, and BMI. Blood pressure. Lipid and cholesterol levels. These may be checked every 5 years, or more frequently if you are over 88 years old. Skin check. Lung cancer screening. You may have this screening every year starting at age 49 if you have a 30-pack-year history of smoking and currently smoke or have quit within the past 15 years. Fecal occult blood test (FOBT) of the stool. You may have this test every year starting at age 56. Flexible sigmoidoscopy or colonoscopy. You may have a sigmoidoscopy every 5 years or a colonoscopy every 10 years starting at age 34. Hepatitis C blood test. Hepatitis B blood test. Sexually transmitted disease (STD) testing. Diabetes screening. This is done by checking your blood sugar (glucose) after you have not eaten for a while (fasting). You may have this done every 1-3 years. Bone density scan. This is done to screen for osteoporosis. You may have this done starting at age 11. Mammogram. This may be done every 1-2 years. Talk to your health care provider about how often you should have regular mammograms. Talk with your health care provider about your test results, treatment options, and if necessary, the need for more tests. Vaccines  Your health care provider may recommend certain vaccines, such as: Influenza vaccine. This is recommended every year. Tetanus, diphtheria, and acellular pertussis (Tdap, Td) vaccine. You may need a Td booster every 10 years. Zoster vaccine. You may need this after age 23. Pneumococcal 13-valent conjugate (PCV13) vaccine.  One dose is recommended after age 40. Pneumococcal polysaccharide (PPSV23) vaccine.  One dose is recommended after age 1. Talk to your health care provider about which screenings and vaccines you need and how often you need them. This information is not intended to replace advice given to you by your health care provider. Make sure you discuss any questions you have with your health care provider. Document Released: 06/19/2015 Document Revised: 02/10/2016 Document Reviewed: 03/24/2015 Elsevier Interactive Patient Education  2017 [place] Prevention in the Home Falls can cause injuries. They can happen to people of all ages. There are many things you can do to make your home safe and to help prevent falls. What can I do on the outside of my home? Regularly fix the edges of walkways and driveways and fix any cracks. Remove anything that might make you trip as you walk through a door, such as a raised step or threshold. Trim any bushes or trees on the path to your home. Use bright outdoor lighting. Clear any walking paths of anything that might make someone trip, such as rocks or tools. Regularly check to see if handrails are loose or broken. Make sure that both sides of any steps have handrails. Any raised decks and porches should have guardrails on the edges. Have any leaves, snow, or ice cleared regularly. Use sand or salt on walking paths during winter. Clean up any spills in your garage right away. This includes oil or grease spills. What can I do in the bathroom? Use night lights. Install grab bars by the toilet and in the tub and shower. Do not use towel bars as grab bars. Use non-skid mats or decals in the tub or shower. If you need to sit down in the shower, use a plastic, non-slip stool. Keep the floor dry. Clean up any water that spills on the floor as soon as it happens. Remove soap buildup in the tub or shower regularly. Attach bath mats securely with double-sided non-slip rug tape. Do not have throw rugs and other things on the floor that can make  you trip. What can I do in the bedroom? Use night lights. Make sure that you have a light by your bed that is easy to reach. Do not use any sheets or blankets that are too big for your bed. They should not hang down onto the floor. Have a firm chair that has side arms. You can use this for support while you get dressed. Do not have throw rugs and other things on the floor that can make you trip. What can I do in the kitchen? Clean up any spills right away. Avoid walking on wet floors. Keep items that you use a lot in easy-to-reach places. If you need to reach something above you, use a strong step stool that has a grab bar. Keep electrical cords out of the way. Do not use floor polish or wax that makes floors slippery. If you must use wax, use non-skid floor wax. Do not have throw rugs and other things on the floor that can make you trip. What can I do with my stairs? Do not leave any items on the stairs. Make sure that there are handrails on both sides of the stairs and use them. Fix handrails that are broken or loose. Make sure that handrails are as long as the stairways. Check any carpeting to make sure that it is firmly attached to the stairs. Fix any carpet that is loose or  worn. Avoid having throw rugs at the top or bottom of the stairs. If you do have throw rugs, attach them to the floor with carpet tape. Make sure that you have a light switch at the top of the stairs and the bottom of the stairs. If you do not have them, ask someone to add them for you. What else can I do to help prevent falls? Wear shoes that: Do not have high heels. Have rubber bottoms. Are comfortable and fit you well. Are closed at the toe. Do not wear sandals. If you use a stepladder: Make sure that it is fully opened. Do not climb a closed stepladder. Make sure that both sides of the stepladder are locked into place. Ask someone to hold it for you, if possible. Clearly mark and make sure that you can  see: Any grab bars or handrails. First and last steps. Where the edge of each step is. Use tools that help you move around (mobility aids) if they are needed. These include: Canes. Walkers. Scooters. Crutches. Turn on the lights when you go into a dark area. Replace any light bulbs as soon as they burn out. Set up your furniture so you have a clear path. Avoid moving your furniture around. If any of your floors are uneven, fix them. If there are any pets around you, be aware of where they are. Review your medicines with your doctor. Some medicines can make you feel dizzy. This can increase your chance of falling. Ask your doctor what other things that you can do to help prevent falls. This information is not intended to replace advice given to you by your health care provider. Make sure you discuss any questions you have with your health care provider. Document Released: 03/19/2009 Document Revised: 10/29/2015 Document Reviewed: 06/27/2014 Elsevier Interactive Patient Education  2017 [employer].

## 2021-03-09 NOTE — Progress Notes (Signed)
Virtual Visit via Telephone Note  I connected with  Hildred Priest on 03/09/21 at  1:45 PM EDT by telephone and verified that I am speaking with the correct person using two identifiers.  Medicare Annual Wellness visit completed telephonically due to Covid-19 pandemic.   Persons participating in this call: This Health Coach and this patient.   Location: Patient: Home Provider: Office   I discussed the limitations, risks, security and privacy concerns of performing an evaluation and management service by telephone and the availability of in person appointments. The patient expressed understanding and agreed to proceed.  Unable to perform video visit due to video visit attempted and failed and/or patient does not have video capability.   Some vital signs may be absent or patient reported.   Willette Brace, LPN   Subjective:   Jewelene Mairena is a 70 y.o. female who presents for Medicare Annual (Subsequent) preventive examination.  Review of Systems     Cardiac Risk Factors include: advanced age (>40men, >30 women);hypertension;dyslipidemia     Objective:    There were no vitals filed for this visit. There is no height or weight on file to calculate BMI.  Advanced Directives 03/09/2021 01/22/2020 01/11/2018 01/02/2018 06/26/2017 03/17/2017  Does Patient Have a Medical Advance Directive? No No No No No No  Would patient like information on creating a medical advance directive? Yes (MAU/Ambulatory/Procedural Areas - Information given) No - Patient declined No - Patient declined No - Patient declined - -    Current Medications (verified) Outpatient Encounter Medications as of 03/09/2021  Medication Sig   ibuprofen (ADVIL) 800 MG tablet TAKE 1 TABLET BY MOUTH EVERY 6 (SIX) HOURS AS NEEDED FOR MODERATE PAIN.   No facility-administered encounter medications on file as of 03/09/2021.    Allergies (verified) Codeine   History: Past Medical History:  Diagnosis Date   Arthritis     hip   GERD (gastroesophageal reflux disease)    Hyperlipidemia    Past Surgical History:  Procedure Laterality Date   COLONOSCOPY  06/26/2017   per Dr. Henrene Pastor, benign polyps, repeat in 10 yrs    NEUROPLASTY / [place] Right 01/11/2018   Procedure: RIGHT TOTAL HIP ARTHROPLASTY ANTERIOR APPROACH;  Surgeon: Rod Can, MD;  Location: [facility];  Service: Orthopedics;  Laterality: Right;   Family History  Problem Relation Age of Onset   Diabetes Sister    Colon cancer Neg Hx    Colon polyps Neg Hx    Esophageal cancer Neg Hx    Rectal cancer Neg Hx    Stomach cancer Neg Hx    Social History   Socioeconomic History   Marital status: Legally Separated    Spouse name: Not on file   Number of children: Not on file   Years of education: Not on file   Highest education level: Not on file  Occupational History   Not on file  Tobacco Use   Smoking status: Former    Types: Cigarettes   Smokeless tobacco: Never   Tobacco comments:    only when 16 briefly   Vaping Use   Vaping Use: Never used  Substance and Sexual Activity   Alcohol use: Yes    Alcohol/week: 0.0 standard drinks    Comment: occ wine    Drug use: No   Sexual activity: Not on file  Other Topics Concern   Not on  file  Social History Narrative   Not on file   Social Determinants of Health   Financial Resource Strain: Low Risk    Difficulty of Paying Living Expenses: Not hard at all  Food Insecurity: No Food Insecurity   Worried About Charity fundraiser in the Last Year: Never true   Arboriculturist in the Last Year: Never true  Transportation Needs: No Transportation Needs   Lack of Transportation (Medical): No   Lack of Transportation (Non-Medical): No  Physical Activity: Insufficiently Active   Days of Exercise per Week: 5 days   Minutes of Exercise per Session: 20 min  Stress: Stress Concern Present   Feeling of  Stress : To some extent  Social Connections: Moderately Integrated   Frequency of Communication with Friends and Family: More than three times a week   Frequency of Social Gatherings with Friends and Family: More than three times a week   Attends Religious Services: More than 4 times per year   Active Member of [employer] or Organizations: Yes   Attends Archivist Meetings: 1 to 4 times per year   Marital Status: Separated    Tobacco Counseling Counseling given: Not Answered Tobacco comments: only when 16 briefly    Clinical Intake:  Pre-visit preparation completed: Yes  Pain : No/denies pain     BMI - recorded: 23.13 Nutritional Status: BMI of 19-24  Normal Nutritional Risks: None Diabetes: No  How often do you need to have someone help you when you read instructions, pamphlets, or other written materials from your doctor or pharmacy?: 1 - Never  Diabetic?No  Interpreter Needed?: No  Information entered by :: Charlott Rakes, LPN   Activities of Daily Living In your present state of health, do you have any difficulty performing the following activities: 03/09/2021  Hearing? N  Vision? N  Difficulty concentrating or making decisions? N  Walking or climbing stairs? N  Dressing or bathing? N  Doing errands, shopping? N  Preparing Food and eating ? N  Using the Toilet? N  In the past six months, have you accidently leaked urine? N  Do you have problems with loss of bowel control? N  Managing your Medications? N  Managing your Finances? N  Housekeeping or managing your Housekeeping? N  Some recent data might be hidden    Patient Care Team: Laurey Morale, MD as PCP - General  Indicate any recent Medical Services you may have received from other than [facility] providers in the past year (date may be approximate).     Assessment:   This is a routine wellness examination for [place].  Hearing/Vision screen Hearing Screening - Comments:: Pt denies any hearing  issues  Vision Screening - Comments:: Pt follows up with [facility] for annual eye exams   Dietary issues and exercise activities discussed: Current Exercise Habits: Home exercise routine, Type of exercise: walking, Time (Minutes): 20, Frequency (Times/Week): 5, Weekly Exercise (Minutes/Week): 100   Goals Addressed             This Visit's Progress    Patient Stated       None at this times        Depression Screen PHQ 2/9 Scores 03/09/2021 09/30/2020 01/22/2020 03/09/2018 03/17/2017 11/14/2014  PHQ - 2 Score 0 2 0 0 0 0  PHQ- 9 Score - 2 0 - - -    Fall Risk Fall Risk  03/09/2021 09/30/2020 09/30/2020 01/22/2020 03/09/2018  Falls in the past  year? 0 0 0 0 No  Number falls in past yr: 0 0 0 0 -  Injury with Fall? 0 0 0 0 -  Risk for fall due to : Impaired vision No Fall Risks No Fall Risks No Fall Risks -  Follow up Falls prevention discussed - - Falls evaluation completed;Falls prevention discussed -    FALL RISK PREVENTION PERTAINING TO THE HOME:  Any stairs in or around the home? Yes  If so, are there any without handrails? No Home free of loose throw rugs in walkways, pet beds, electrical cords, etc? Yes  Adequate lighting in your home to reduce risk of falls? Yes   ASSISTIVE DEVICES UTILIZED TO PREVENT FALLS:   Life alert? No  Use of a cane, walker or w/c? No  Grab bars in the bathroom? Yes  Shower chair or bench in shower? No  Elevated toilet seat or a handicapped toilet? No   TIMED UP AND GO:  Was the test performed? No .   Cognitive Function:     6CIT Screen 03/09/2021 01/22/2020  What Year? 0 points 0 points  What month? 0 points 0 points  What time? 0 points 0 points  Count back from 20 0 points 0 points  Months in reverse 0 points 0 points  Repeat phrase 0 points 2 points  Total Score 0 2    Immunizations Immunization History  Administered Date(s) Administered   Moderna Sars-Covid-2 Vaccination 10/21/2019, 11/18/2019, 06/19/2020   Pneumococcal  Conjugate-13 03/09/2018   Pneumococcal Polysaccharide-23 08/14/2019    TDAP status: Due, Education has been provided regarding the importance of this vaccine. Advised may receive this vaccine at local pharmacy or Health Dept. Aware to provide a copy of the vaccination record if obtained from local pharmacy or Health Dept. Verbalized acceptance and understanding.  Flu Vaccine status: Due, Education has been provided regarding the importance of this vaccine. Advised may receive this vaccine at local pharmacy or Health Dept. Aware to provide a copy of the vaccination record if obtained from local pharmacy or Health Dept. Verbalized acceptance and understanding.  Pneumococcal vaccine status: Up to date  Covid-19 vaccine status: Completed vaccines  Qualifies for Shingles Vaccine? Yes   Zostavax completed No   Shingrix Completed?: No.    Education has been provided regarding the importance of this vaccine. Patient has been advised to call insurance company to determine out of pocket expense if they have not yet received this vaccine. Advised may also receive vaccine at local pharmacy or Health Dept. Verbalized acceptance and understanding.  Screening Tests Health Maintenance  Topic Date Due   Hepatitis C Screening  Never done   TETANUS/TDAP  Never done   MAMMOGRAM  Never done   Zoster Vaccines- Shingrix (1 of 2) Never done   DEXA SCAN  Never done   COVID-19 Vaccine (4 - Booster for Moderna series) 10/17/2020   INFLUENZA VACCINE  Never done   COLONOSCOPY (Pts 45-62yrs Insurance coverage will need to be confirmed)  06/27/2027   HPV VACCINES  Aged Out    Health Maintenance  Health Maintenance Due  Topic Date Due   Hepatitis C Screening  Never done   TETANUS/TDAP  Never done   MAMMOGRAM  Never done   Zoster Vaccines- Shingrix (1 of 2) Never done   DEXA SCAN  Never done   COVID-19 Vaccine (4 - Booster for Moderna series) 10/17/2020   INFLUENZA VACCINE  Never done    Colorectal cancer  screening: Type of screening:  Colonoscopy. Completed 06/26/17. Repeat every 10 years  Mammogram pt stated she will follow up with Dr Corinna Capra     Additional Screening:  Hepatitis C Screening: does qualify;  Vision Screening: Recommended annual ophthalmology exams for early detection of glaucoma and other disorders of the eye. Is the patient up to date with their annual eye exam?  Yes  Who is the provider or what is the name of the office in which the patient attends annual eye exams? [place]  If pt is not established with a provider, would they like to be referred to a provider to establish care? No .   Dental Screening: Recommended annual dental exams for proper oral hygiene  Community Resource Referral / Chronic Care Management: CRR required this visit?  No   CCM required this visit?  No      Plan:     I have personally reviewed and noted the following in the patient's chart:   Medical and social history Use of alcohol, tobacco or illicit drugs  Current medications and supplements including opioid prescriptions.  Functional ability and status Nutritional status Physical activity Advanced directives List of other physicians Hospitalizations, surgeries, and ER visits in previous 12 months Vitals Screenings to include cognitive, depression, and falls Referrals and appointments  In addition, I have reviewed and discussed with patient certain preventive protocols, quality metrics, and best practice recommendations. A written personalized care plan for preventive services as well as general preventive health recommendations were provided to patient.     Willette Brace, LPN  [id number]   Nurse Notes: None

## 2021-04-06 DIAGNOSIS — Z0100 Encounter for examination of eyes and vision without abnormal findings: Secondary | ICD-10-CM | POA: Diagnosis not present

## 2021-04-06 DIAGNOSIS — H5203 Hypermetropia, bilateral: Secondary | ICD-10-CM | POA: Diagnosis not present

## 2021-06-21 ENCOUNTER — Other Ambulatory Visit: Payer: Self-pay | Admitting: Family Medicine

## 2021-06-21 DIAGNOSIS — M1611 Unilateral primary osteoarthritis, right hip: Secondary | ICD-10-CM

## 2021-06-24 ENCOUNTER — Telehealth: Payer: Self-pay | Admitting: Family Medicine

## 2021-06-24 NOTE — Telephone Encounter (Signed)
Patient brought in paperwork that she needs Dr.Fry to complete. Paperwork would be placed in folder.  Patient could be contacted at [phone] when paperwork is complete.  Please advise.

## 2021-06-25 NOTE — Telephone Encounter (Signed)
Pt Placard has been placed in Dr Sarajane Jews red folder for completion

## 2021-06-29 NOTE — Telephone Encounter (Signed)
Spoke with pt notified to pick up placard from the front office, copy sent to scanning

## 2021-09-07 ENCOUNTER — Other Ambulatory Visit: Payer: Self-pay | Admitting: Family Medicine

## 2021-09-07 DIAGNOSIS — M1611 Unilateral primary osteoarthritis, right hip: Secondary | ICD-10-CM

## 2021-11-02 ENCOUNTER — Other Ambulatory Visit: Payer: Self-pay | Admitting: Family Medicine

## 2021-11-02 DIAGNOSIS — M1611 Unilateral primary osteoarthritis, right hip: Secondary | ICD-10-CM

## 2021-12-14 DIAGNOSIS — Z01419 Encounter for gynecological examination (general) (routine) without abnormal findings: Secondary | ICD-10-CM | POA: Diagnosis not present

## 2021-12-14 DIAGNOSIS — Z1231 Encounter for screening mammogram for malignant neoplasm of breast: Secondary | ICD-10-CM | POA: Diagnosis not present

## 2021-12-14 DIAGNOSIS — Z6823 Body mass index (BMI) 23.0-23.9, adult: Secondary | ICD-10-CM | POA: Diagnosis not present

## 2021-12-18 ENCOUNTER — Other Ambulatory Visit: Payer: Self-pay | Admitting: Family Medicine

## 2021-12-18 DIAGNOSIS — M1611 Unilateral primary osteoarthritis, right hip: Secondary | ICD-10-CM

## 2022-03-22 ENCOUNTER — Ambulatory Visit: Payer: Medicare HMO

## 2022-03-22 ENCOUNTER — Telehealth: Payer: Self-pay

## 2022-03-22 NOTE — Telephone Encounter (Signed)
This nurse attempted to call patient three times for telephonic AWV. Mailbox is full and unable to leave a message.

## 2022-04-05 ENCOUNTER — Ambulatory Visit (INDEPENDENT_AMBULATORY_CARE_PROVIDER_SITE_OTHER): Payer: Medicare HMO

## 2022-04-05 VITALS — Ht 65.0 in | Wt 140.0 lb

## 2022-04-05 DIAGNOSIS — Z0000 Encounter for general adult medical examination without abnormal findings: Secondary | ICD-10-CM | POA: Diagnosis not present

## 2022-04-05 NOTE — Patient Instructions (Addendum)
Christy Kirby , Thank you for taking time to come for your Medicare Wellness Visit. I appreciate your ongoing commitment to your health goals. Please review the following plan we discussed and let me know if I can assist you in the future.   These are the goals we discussed:  Goals       patient      Would like the pain to cease in hip       Patient Stated      I would like to my cholesterol through my diet       Patient Stated (pt-stated)      None at this time        This is a list of the screening recommended for you and due dates:  Health Maintenance  Topic Date Due   COVID-19 Vaccine (4 - Moderna series) 04/21/2022*   Zoster (Shingles) Vaccine (1 of 2) 07/06/2022*   Flu Shot  09/04/2022*   Mammogram  04/06/2023*   DEXA scan (bone density measurement)  04/06/2023*   Tetanus Vaccine  04/06/2023*   Hepatitis C Screening: USPSTF Recommendation to screen - Ages 18-79 yo.  04/06/2023*   Medicare Annual Wellness Visit  04/06/2023   Colon Cancer Screening  06/27/2027   Pneumonia Vaccine  Completed   HPV Vaccine  Aged Out  *Topic was postponed. The date shown is not the original due date.    Advanced directives: Advance directive discussed with you today. Even though you declined this today, please call our office should you change your mind, and we can give you the proper paperwork for you to fill out.   Conditions/risks identified: None  Next appointment: Follow up in one year for your annual wellness visit    Preventive Care 65 Years and Older, Female Preventive care refers to lifestyle choices and visits with your health care provider that can promote health and wellness. What does preventive care include? A yearly physical exam. This is also called an annual well check. Dental exams once or twice a year. Routine eye exams. Ask your health care provider how often you should have your eyes checked. Personal lifestyle choices, including: Daily care of your teeth and  gums. Regular physical activity. Eating a healthy diet. Avoiding tobacco and drug use. Limiting alcohol use. Practicing safe sex. Taking low-dose aspirin every day. Taking vitamin and mineral supplements as recommended by your health care provider. What happens during an annual well check? The services and screenings done by your health care provider during your annual well check will depend on your age, overall health, lifestyle risk factors, and family history of disease. Counseling  Your health care provider may ask you questions about your: Alcohol use. Tobacco use. Drug use. Emotional well-being. Home and relationship well-being. Sexual activity. Eating habits. History of falls. Memory and ability to understand (cognition). Work and work Statistician. Reproductive health. Screening  You may have the following tests or measurements: Height, weight, and BMI. Blood pressure. Lipid and cholesterol levels. These may be checked every 5 years, or more frequently if you are over 30 years old. Skin check. Lung cancer screening. You may have this screening every year starting at age 22 if you have a 30-pack-year history of smoking and currently smoke or have quit within the past 15 years. Fecal occult blood test (FOBT) of the stool. You may have this test every year starting at age 79. Flexible sigmoidoscopy or colonoscopy. You may have a sigmoidoscopy every 5 years or a colonoscopy every  10 years starting at age 20. Hepatitis C blood test. Hepatitis B blood test. Sexually transmitted disease (STD) testing. Diabetes screening. This is done by checking your blood sugar (glucose) after you have not eaten for a while (fasting). You may have this done every 1-3 years. Bone density scan. This is done to screen for osteoporosis. You may have this done starting at age 14. Mammogram. This may be done every 1-2 years. Talk to your health care provider about how often you should have regular  mammograms. Talk with your health care provider about your test results, treatment options, and if necessary, the need for more tests. Vaccines  Your health care provider may recommend certain vaccines, such as: Influenza vaccine. This is recommended every year. Tetanus, diphtheria, and acellular pertussis (Tdap, Td) vaccine. You may need a Td booster every 10 years. Zoster vaccine. You may need this after age 39. Pneumococcal 13-valent conjugate (PCV13) vaccine. One dose is recommended after age 62. Pneumococcal polysaccharide (PPSV23) vaccine. One dose is recommended after age 75. Talk to your health care provider about which screenings and vaccines you need and how often you need them. This information is not intended to replace advice given to you by your health care provider. Make sure you discuss any questions you have with your health care provider. Document Released: 06/19/2015 Document Revised: 02/10/2016 Document Reviewed: 03/24/2015 Elsevier Interactive Patient Education  2017 [employer] Prevention in the Home Falls can cause injuries. They can happen to people of all ages. There are many things you can do to make your home safe and to help prevent falls. What can I do on the outside of my home? Regularly fix the edges of walkways and driveways and fix any cracks. Remove anything that might make you trip as you walk through a door, such as a raised step or threshold. Trim any bushes or trees on the path to your home. Use bright outdoor lighting. Clear any walking paths of anything that might make someone trip, such as rocks or tools. Regularly check to see if handrails are loose or broken. Make sure that both sides of any steps have handrails. Any raised decks and porches should have guardrails on the edges. Have any leaves, snow, or ice cleared regularly. Use sand or salt on walking paths during winter. Clean up any spills in your garage right away. This includes oil  or grease spills. What can I do in the bathroom? Use night lights. Install grab bars by the toilet and in the tub and shower. Do not use towel bars as grab bars. Use non-skid mats or decals in the tub or shower. If you need to sit down in the shower, use a plastic, non-slip stool. Keep the floor dry. Clean up any water that spills on the floor as soon as it happens. Remove soap buildup in the tub or shower regularly. Attach bath mats securely with double-sided non-slip rug tape. Do not have throw rugs and other things on the floor that can make you trip. What can I do in the bedroom? Use night lights. Make sure that you have a light by your bed that is easy to reach. Do not use any sheets or blankets that are too big for your bed. They should not hang down onto the floor. Have a firm chair that has side arms. You can use this for support while you get dressed. Do not have throw rugs and other things on the floor that can make you  trip. What can I do in the kitchen? Clean up any spills right away. Avoid walking on wet floors. Keep items that you use a lot in easy-to-reach places. If you need to reach something above you, use a strong step stool that has a grab bar. Keep electrical cords out of the way. Do not use floor polish or wax that makes floors slippery. If you must use wax, use non-skid floor wax. Do not have throw rugs and other things on the floor that can make you trip. What can I do with my stairs? Do not leave any items on the stairs. Make sure that there are handrails on both sides of the stairs and use them. Fix handrails that are broken or loose. Make sure that handrails are as long as the stairways. Check any carpeting to make sure that it is firmly attached to the stairs. Fix any carpet that is loose or worn. Avoid having throw rugs at the top or bottom of the stairs. If you do have throw rugs, attach them to the floor with carpet tape. Make sure that you have a light  switch at the top of the stairs and the bottom of the stairs. If you do not have them, ask someone to add them for you. What else can I do to help prevent falls? Wear shoes that: Do not have high heels. Have rubber bottoms. Are comfortable and fit you well. Are closed at the toe. Do not wear sandals. If you use a stepladder: Make sure that it is fully opened. Do not climb a closed stepladder. Make sure that both sides of the stepladder are locked into place. Ask someone to hold it for you, if possible. Clearly mark and make sure that you can see: Any grab bars or handrails. First and last steps. Where the edge of each step is. Use tools that help you move around (mobility aids) if they are needed. These include: Canes. Walkers. Scooters. Crutches. Turn on the lights when you go into a dark area. Replace any light bulbs as soon as they burn out. Set up your furniture so you have a clear path. Avoid moving your furniture around. If any of your floors are uneven, fix them. If there are any pets around you, be aware of where they are. Review your medicines with your doctor. Some medicines can make you feel dizzy. This can increase your chance of falling. Ask your doctor what other things that you can do to help prevent falls. This information is not intended to replace advice given to you by your health care provider. Make sure you discuss any questions you have with your health care provider. Document Released: 03/19/2009 Document Revised: 10/29/2015 Document Reviewed: 06/27/2014 Elsevier Interactive Patient Education  2017 [employer].

## 2022-04-05 NOTE — Progress Notes (Signed)
Subjective:   Lamae Fosco is a 71 y.o. female who presents for Medicare Annual (Subsequent) preventive examination.  Review of Systems    Virtual Visit via Telephone Note  I connected with  Hildred Priest on 04/05/22 at 10:45 AM EDT by telephone and verified that I am speaking with the correct person using two identifiers.  Location: Patient: Home Provider: Office Persons participating in the virtual visit: patient/Nurse Health Advisor   I discussed the limitations, risks, security and privacy concerns of performing an evaluation and management service by telephone and the availability of in person appointments. The patient expressed understanding and agreed to proceed.  Interactive audio and video telecommunications were attempted between this nurse and patient, however failed, due to patient having technical difficulties OR patient did not have access to video capability.  We continued and completed visit with audio only.  Some vital signs may be absent or patient reported.   Criselda Peaches, LPN  Cardiac Risk Factors include: advanced age (>43mn, >>60women);hypertension     Objective:    Today's Vitals   04/05/22 1046  Weight: 140 lb (63.5 kg)  Height: '5\' 5"'$  (1.651 m)   Body mass index is 23.3 kg/m.     04/05/2022   10:54 AM 03/09/2021    1:56 PM 01/22/2020    9:09 AM 01/11/2018    2:20 PM 01/02/2018    9:24 AM 06/26/2017   10:25 AM 03/17/2017    9:33 AM  Advanced Directives  Does Patient Have a Medical Advance Directive? No No No No No No No  Would patient like information on creating a medical advance directive? No - Patient declined Yes (MAU/Ambulatory/Procedural Areas - Information given) No - Patient declined No - Patient declined No - Patient declined      Current Medications (verified) Outpatient Encounter Medications as of 04/05/2022  Medication Sig   ibuprofen (ADVIL) 800 MG tablet TAKE 1 TABLET BY MOUTH EVERY 6 HOURS AS NEEDED FOR PAIN (MODERATE  PAIN)   No facility-administered encounter medications on file as of 04/05/2022.    Allergies (verified) Codeine   History: Past Medical History:  Diagnosis Date   Arthritis    hip   GERD (gastroesophageal reflux disease)    Hyperlipidemia    Past Surgical History:  Procedure Laterality Date   COLONOSCOPY  06/26/2017   per Dr. PHenrene Pastor benign polyps, repeat in 10 yrs    NEUROPLASTY / TBurnsRight 01/11/2018   Procedure: RIGHT TOTAL HIP ARTHROPLASTY ANTERIOR APPROACH;  Surgeon: SRod Can MD;  Location: [facility];  Service: Orthopedics;  Laterality: Right;   Family History  Problem Relation Age of Onset   Diabetes Sister    Colon cancer Neg Hx    Colon polyps Neg Hx    Esophageal cancer Neg Hx    Rectal cancer Neg Hx    Stomach cancer Neg Hx    Social History   Socioeconomic History   Marital status: Legally Separated    Spouse name: Not on file   Number of children: Not on file   Years of education: Not on file   Highest education level: Not on file  Occupational History   Not on file  Tobacco Use   Smoking status: Former    Types: Cigarettes   Smokeless tobacco: Never   Tobacco comments:    only when 16 briefly   Vaping Use  Vaping Use: Never used  Substance and Sexual Activity   Alcohol use: Yes    Alcohol/week: 0.0 standard drinks of alcohol    Comment: occ wine    Drug use: No   Sexual activity: Not on file  Other Topics Concern   Not on file  Social History Narrative   Not on file   Social Determinants of Health   Financial Resource Strain: Low Risk  (04/05/2022)   Overall Financial Resource Strain (CARDIA)    Difficulty of Paying Living Expenses: Not hard at all  Food Insecurity: No Food Insecurity (04/05/2022)   Hunger Vital Sign    Worried About Running Out of Food in the Last Year: Never true    Ran Out of Food in the Last Year: Never true   Transportation Needs: No Transportation Needs (03/09/2021)   PRAPARE - Hydrologist (Medical): No    Lack of Transportation (Non-Medical): No  Physical Activity: Inactive (04/05/2022)   Exercise Vital Sign    Days of Exercise per Week: 0 days    Minutes of Exercise per Session: 0 min  Stress: No Stress Concern Present (04/05/2022)   [place]    Feeling of Stress : Not at all  Social Connections: Moderately Integrated (04/05/2022)   Social Connection and Isolation Panel [NHANES]    Frequency of Communication with Friends and Family: More than three times a week    Frequency of Social Gatherings with Friends and Family: More than three times a week    Attends Religious Services: More than 4 times per year    Active Member of [employer] or Organizations: Yes    Attends Music therapist: More than 4 times per year    Marital Status: Separated    Tobacco Counseling Counseling given: Not Answered Tobacco comments: only when 16 briefly    Clinical Intake:  Pre-visit preparation completed: No  Pain : No/denies pain     BMI - recorded: 23.3 Nutritional Status: BMI of 19-24  Normal Nutritional Risks: None Diabetes: No  How often do you need to have someone help you when you read instructions, pamphlets, or other written materials from your doctor or pharmacy?: 1 - Never  Diabetic?  No  Interpreter Needed?: No  Information entered by :: Rolene Arbour LPN   Activities of Daily Living    04/05/2022   10:53 AM  In your present state of health, do you have any difficulty performing the following activities:  Hearing? 0  Vision? 0  Difficulty concentrating or making decisions? 0  Walking or climbing stairs? 0  Dressing or bathing? 0  Doing errands, shopping? 0  Preparing Food and eating ? N  Using the Toilet? N  In the past six months, have you accidently leaked  urine? N  Do you have problems with loss of bowel control? N  Managing your Medications? N  Managing your Finances? N  Housekeeping or managing your Housekeeping? N    Patient Care Team: Laurey Morale, MD as PCP - General  Indicate any recent Medical Services you may have received from other than [facility] providers in the past year (date may be approximate).     Assessment:   This is a routine wellness examination for Carmeline.  Hearing/Vision screen Hearing Screening - Comments:: Denies hearing difficulties   Vision Screening - Comments:: Wears rx glasses - up to date with routine eye exams with  [facility]  Dietary issues and exercise activities discussed: Current Exercise Habits: The patient does not participate in regular exercise at present, Exercise limited by: None identified   Goals Addressed               This Visit's Progress     Patient Stated (pt-stated)        None at this time       Depression Screen    04/05/2022   10:51 AM 03/09/2021    1:54 PM 09/30/2020    8:40 AM 01/22/2020    9:12 AM 03/09/2018    9:18 AM 03/17/2017    9:40 AM 11/14/2014   11:45 AM  PHQ 2/9 Scores  PHQ - 2 Score 0 0 2 0 0 0 0  PHQ- 9 Score   2 0       Fall Risk    04/05/2022   10:53 AM 03/09/2021    1:57 PM 09/30/2020    8:40 AM 09/30/2020    8:11 AM 01/22/2020    9:10 AM  [place] in the past year? 0 0 0 0 0  Number falls in past yr: 0 0 0 0 0  Injury with Fall? 0 0 0 0 0  Risk for fall due to : No Fall Risks Impaired vision No Fall Risks No Fall Risks No Fall Risks  Follow up Falls prevention discussed Falls prevention discussed   Falls evaluation completed;Falls prevention discussed    FALL RISK PREVENTION PERTAINING TO THE HOME:  Any stairs in or around the home? Yes  If so, are there any without handrails? No  Home free of loose throw rugs in walkways, pet beds, electrical cords, etc? Yes  Adequate lighting in your home to reduce risk of falls? Yes    ASSISTIVE DEVICES UTILIZED TO PREVENT FALLS:  Life alert? No  Use of a cane, walker or w/c? No  Grab bars in the bathroom? No  Shower chair or bench in shower? No  Elevated toilet seat or a handicapped toilet? No   TIMED UP AND GO:  Was the test performed? No . Audio Visit   Cognitive Function:        04/05/2022   10:54 AM 03/09/2021    2:00 PM 01/22/2020    9:14 AM  6CIT Screen  What Year? 0 points 0 points 0 points  What month? 0 points 0 points 0 points  What time? 0 points 0 points 0 points  Count back from 20 0 points 0 points 0 points  Months in reverse 0 points 0 points 0 points  Repeat phrase 0 points 0 points 2 points  Total Score 0 points 0 points 2 points    Immunizations Immunization History  Administered Date(s) Administered   Moderna Sars-Covid-2 Vaccination 10/21/2019, 11/18/2019, 06/19/2020   Pneumococcal Conjugate-13 03/09/2018   Pneumococcal Polysaccharide-23 08/14/2019    TDAP status: Due, Education has been provided regarding the importance of this vaccine. Advised may receive this vaccine at local pharmacy or Health Dept. Aware to provide a copy of the vaccination record if obtained from local pharmacy or Health Dept. Verbalized acceptance and understanding.  Flu Vaccine status: Declined, Education has been provided regarding the importance of this vaccine but patient still declined. Advised may receive this vaccine at local pharmacy or Health Dept. Aware to provide a copy of the vaccination record if obtained from local pharmacy or Health Dept. Verbalized acceptance and understanding.  Pneumococcal vaccine status: Up to date  Covid-19 vaccine  status: Completed vaccines  Qualifies for Shingles Vaccine? Yes   Zostavax completed No   Shingrix Completed?: No.    Education has been provided regarding the importance of this vaccine. Patient has been advised to call insurance company to determine out of pocket expense if they have not yet received  this vaccine. Advised may also receive vaccine at local pharmacy or Health Dept. Verbalized acceptance and understanding.  Screening Tests Health Maintenance  Topic Date Due   COVID-19 Vaccine (4 - Moderna series) 04/21/2022 (Originally 08/14/2020)   Zoster Vaccines- Shingrix (1 of 2) 07/06/2022 (Originally 06/11/1998)   INFLUENZA VACCINE  09/04/2022 (Originally 01/04/2022)   MAMMOGRAM  04/06/2023 (Originally 06/11/1998)   DEXA SCAN  04/06/2023 (Originally 06/11/2013)   TETANUS/TDAP  04/06/2023 (Originally 06/12/1967)   Hepatitis C Screening  04/06/2023 (Originally 06/11/1968)   Medicare Annual Wellness (AWV)  04/06/2023   COLONOSCOPY (Pts 45-35yr Insurance coverage will need to be confirmed)  06/27/2027   Pneumonia Vaccine 71 Years old  Completed   HPV VACCINES  Aged Out    Health Maintenance  There are no preventive care reminders to display for this patient.   Colorectal cancer screening: Type of screening: Colonoscopy. Completed 06/26/17. Repeat every 10 years  Mammogram status: Ordered Patient deferred. Pt provided with contact info and advised to call to schedule appt.   Bone Density status: Ordered Patient deferred. Pt provided with contact info and advised to call to schedule appt.  Lung Cancer Screening: (Low Dose CT Chest recommended if Age 73-80years, 30 pack-year currently smoking OR have quit w/in 15years.) does not qualify.     Additional Screening:  Hepatitis C Screening: does qualify; Completed Deferred  Vision Screening: Recommended annual ophthalmology exams for early detection of glaucoma and other disorders of the eye. Is the patient up to date with their annual eye exam?  Yes  Who is the provider or what is the name of the [facility] which the patient attends annual [facility] exams[facility] a provider, would they like to be referred to a provider to establish care? No .   Dental Screening: Recommended annual dental exams for proper  oral hygiene  Community Resource Referral / Chronic Care Management:  CRR required this visit?  No   CCM required this visit?  No      Plan:     I have personally reviewed and noted the following in the patient's chart:   Medical and social history Use of alcohol, tobacco or illicit drugs  Current medications and supplements including opioid prescriptions. Patient is not currently taking opioid prescriptions. Functional ability and status Nutritional status Physical activity Advanced directives List of other physicians Hospitalizations, surgeries, and ER visits in previous 12 months Vitals Screenings to include cognitive, depression, and falls Referrals and appointments  In addition, I have reviewed and discussed with patient certain preventive protocols, quality metrics, and best practice recommendations. A written personalized care plan for preventive services as well as general preventive health recommendations were provided to patient.     BCriselda Peaches LPN   [id number]  Nurse Notes: Patient due labs Hep-C Screening

## 2022-05-17 DIAGNOSIS — H2513 Age-related nuclear cataract, bilateral: Secondary | ICD-10-CM | POA: Diagnosis not present

## 2022-05-17 DIAGNOSIS — H43813 Vitreous degeneration, bilateral: Secondary | ICD-10-CM | POA: Diagnosis not present

## 2022-05-17 DIAGNOSIS — H52223 Regular astigmatism, bilateral: Secondary | ICD-10-CM | POA: Diagnosis not present

## 2022-05-17 DIAGNOSIS — H5203 Hypermetropia, bilateral: Secondary | ICD-10-CM | POA: Diagnosis not present

## 2022-05-17 DIAGNOSIS — H25013 Cortical age-related cataract, bilateral: Secondary | ICD-10-CM | POA: Diagnosis not present

## 2022-06-05 ENCOUNTER — Encounter (HOSPITAL_COMMUNITY): Payer: Self-pay

## 2022-06-05 ENCOUNTER — Emergency Department (HOSPITAL_COMMUNITY): Payer: Medicare HMO

## 2022-06-05 ENCOUNTER — Inpatient Hospital Stay (HOSPITAL_COMMUNITY)
Admission: EM | Admit: 2022-06-05 | Discharge: 2022-06-10 | DRG: 392 | Disposition: A | Discharge status: Home / Self Care | Payer: Medicare HMO | Attending: Internal Medicine | Admitting: Internal Medicine

## 2022-06-05 DIAGNOSIS — Z87891 Personal history of nicotine dependence: Secondary | ICD-10-CM

## 2022-06-05 DIAGNOSIS — K2970 Gastritis, unspecified, without bleeding: Secondary | ICD-10-CM | POA: Diagnosis not present

## 2022-06-05 DIAGNOSIS — R7989 Other specified abnormal findings of blood chemistry: Secondary | ICD-10-CM | POA: Diagnosis present

## 2022-06-05 DIAGNOSIS — R109 Unspecified abdominal pain: Secondary | ICD-10-CM | POA: Diagnosis not present

## 2022-06-05 DIAGNOSIS — K529 Noninfective gastroenteritis and colitis, unspecified: Principal | ICD-10-CM

## 2022-06-05 DIAGNOSIS — E878 Other disorders of electrolyte and fluid balance, not elsewhere classified: Secondary | ICD-10-CM | POA: Diagnosis present

## 2022-06-05 DIAGNOSIS — R1110 Vomiting, unspecified: Secondary | ICD-10-CM | POA: Diagnosis not present

## 2022-06-05 DIAGNOSIS — K8020 Calculus of gallbladder without cholecystitis without obstruction: Secondary | ICD-10-CM | POA: Diagnosis not present

## 2022-06-05 DIAGNOSIS — M1610 Unilateral primary osteoarthritis, unspecified hip: Secondary | ICD-10-CM | POA: Diagnosis present

## 2022-06-05 DIAGNOSIS — E785 Hyperlipidemia, unspecified: Secondary | ICD-10-CM | POA: Diagnosis present

## 2022-06-05 DIAGNOSIS — K2950 Unspecified chronic gastritis without bleeding: Secondary | ICD-10-CM | POA: Diagnosis not present

## 2022-06-05 DIAGNOSIS — R933 Abnormal findings on diagnostic imaging of other parts of digestive tract: Secondary | ICD-10-CM | POA: Diagnosis not present

## 2022-06-05 DIAGNOSIS — K31A11 Gastric intestinal metaplasia without dysplasia, involving the antrum: Secondary | ICD-10-CM | POA: Diagnosis not present

## 2022-06-05 DIAGNOSIS — K449 Diaphragmatic hernia without obstruction or gangrene: Secondary | ICD-10-CM | POA: Diagnosis present

## 2022-06-05 DIAGNOSIS — I700 Atherosclerosis of aorta: Secondary | ICD-10-CM | POA: Diagnosis not present

## 2022-06-05 DIAGNOSIS — E876 Hypokalemia: Secondary | ICD-10-CM | POA: Diagnosis present

## 2022-06-05 DIAGNOSIS — M1990 Unspecified osteoarthritis, unspecified site: Secondary | ICD-10-CM | POA: Diagnosis not present

## 2022-06-05 DIAGNOSIS — R112 Nausea with vomiting, unspecified: Secondary | ICD-10-CM | POA: Diagnosis not present

## 2022-06-05 DIAGNOSIS — K2900 Acute gastritis without bleeding: Secondary | ICD-10-CM | POA: Diagnosis not present

## 2022-06-05 DIAGNOSIS — Z8719 Personal history of other diseases of the digestive system: Secondary | ICD-10-CM | POA: Diagnosis not present

## 2022-06-05 DIAGNOSIS — I160 Hypertensive urgency: Secondary | ICD-10-CM | POA: Diagnosis present

## 2022-06-05 DIAGNOSIS — Z885 Allergy status to narcotic agent status: Secondary | ICD-10-CM

## 2022-06-05 DIAGNOSIS — R948 Abnormal results of function studies of other organs and systems: Secondary | ICD-10-CM | POA: Diagnosis present

## 2022-06-05 DIAGNOSIS — E872 Acidosis, unspecified: Secondary | ICD-10-CM | POA: Diagnosis present

## 2022-06-05 DIAGNOSIS — R252 Cramp and spasm: Secondary | ICD-10-CM | POA: Diagnosis present

## 2022-06-05 DIAGNOSIS — K2289 Other specified disease of esophagus: Secondary | ICD-10-CM | POA: Diagnosis not present

## 2022-06-05 DIAGNOSIS — Z86010 Personal history of colonic polyps: Secondary | ICD-10-CM | POA: Diagnosis not present

## 2022-06-05 DIAGNOSIS — I10 Essential (primary) hypertension: Secondary | ICD-10-CM | POA: Diagnosis present

## 2022-06-05 DIAGNOSIS — R188 Other ascites: Secondary | ICD-10-CM | POA: Diagnosis not present

## 2022-06-05 DIAGNOSIS — R079 Chest pain, unspecified: Secondary | ICD-10-CM | POA: Diagnosis not present

## 2022-06-05 DIAGNOSIS — R1084 Generalized abdominal pain: Secondary | ICD-10-CM | POA: Diagnosis not present

## 2022-06-05 DIAGNOSIS — Z96641 Presence of right artificial hip joint: Secondary | ICD-10-CM | POA: Diagnosis present

## 2022-06-05 DIAGNOSIS — E860 Dehydration: Secondary | ICD-10-CM | POA: Diagnosis present

## 2022-06-05 DIAGNOSIS — R1013 Epigastric pain: Secondary | ICD-10-CM | POA: Diagnosis present

## 2022-06-05 DIAGNOSIS — K219 Gastro-esophageal reflux disease without esophagitis: Secondary | ICD-10-CM | POA: Diagnosis present

## 2022-06-05 DIAGNOSIS — R935 Abnormal findings on diagnostic imaging of other abdominal regions, including retroperitoneum: Secondary | ICD-10-CM

## 2022-06-05 DIAGNOSIS — B9681 Helicobacter pylori [H. pylori] as the cause of diseases classified elsewhere: Secondary | ICD-10-CM | POA: Diagnosis present

## 2022-06-05 LAB — RAPID URINE DRUG SCREEN, HOSP PERFORMED
Amphetamines: NOT DETECTED
Barbiturates: NOT DETECTED
Benzodiazepines: NOT DETECTED
Cocaine: NOT DETECTED
Opiates: NOT DETECTED
Tetrahydrocannabinol: NOT DETECTED

## 2022-06-05 LAB — LACTIC ACID, PLASMA
Lactic Acid, Venous: 2.8 mmol/L (ref 0.5–1.9)
Lactic Acid, Venous: 3.1 mmol/L (ref 0.5–1.9)

## 2022-06-05 LAB — URINALYSIS, ROUTINE W REFLEX MICROSCOPIC
Bilirubin Urine: NEGATIVE
Glucose, UA: 50 mg/dL — AB
Hgb urine dipstick: NEGATIVE
Ketones, ur: 5 mg/dL — AB
Leukocytes,Ua: NEGATIVE
Nitrite: NEGATIVE
Protein, ur: NEGATIVE mg/dL
Specific Gravity, Urine: 1.012 (ref 1.005–1.030)
pH: 8 (ref 5.0–8.0)

## 2022-06-05 LAB — CBC WITH DIFFERENTIAL/PLATELET
Abs Immature Granulocytes: 0.03 10*3/uL (ref 0.00–0.07)
Basophils Absolute: 0 10*3/uL (ref 0.0–0.1)
Basophils Relative: 1 %
Eosinophils Absolute: 0 10*3/uL (ref 0.0–0.5)
Eosinophils Relative: 0 %
HCT: 39 % (ref 36.0–46.0)
Hemoglobin: 12.9 g/dL (ref 12.0–15.0)
Immature Granulocytes: 0 %
Lymphocytes Relative: 13 %
Lymphs Abs: 0.9 10*3/uL (ref 0.7–4.0)
MCH: 30.1 pg (ref 26.0–34.0)
MCHC: 33.1 g/dL (ref 30.0–36.0)
MCV: 91.1 fL (ref 80.0–100.0)
Monocytes Absolute: 0.2 10*3/uL (ref 0.1–1.0)
Monocytes Relative: 3 %
Neutro Abs: 5.9 10*3/uL (ref 1.7–7.7)
Neutrophils Relative %: 83 %
Platelets: 271 10*3/uL (ref 150–400)
RBC: 4.28 MIL/uL (ref 3.87–5.11)
RDW: 13 % (ref 11.5–15.5)
WBC: 7.1 10*3/uL (ref 4.0–10.5)
nRBC: 0 % (ref 0.0–0.2)

## 2022-06-05 LAB — COMPREHENSIVE METABOLIC PANEL
ALT: 14 U/L (ref 0–44)
AST: 21 U/L (ref 15–41)
Albumin: 4.2 g/dL (ref 3.5–5.0)
Alkaline Phosphatase: 52 U/L (ref 38–126)
Anion gap: 11 (ref 5–15)
BUN: 11 mg/dL (ref 8–23)
CO2: 23 mmol/L (ref 22–32)
Calcium: 9.5 mg/dL (ref 8.9–10.3)
Chloride: 104 mmol/L (ref 98–111)
Creatinine, Ser: 0.79 mg/dL (ref 0.44–1.00)
GFR, Estimated: >60 mL/min (ref >60)
Glucose, Bld: 121 mg/dL — ABNORMAL HIGH (ref 70–99)
Potassium: 3.6 mmol/L (ref 3.5–5.1)
Sodium: 138 mmol/L (ref 135–145)
Total Bilirubin: 0.7 mg/dL (ref 0.3–1.2)
Total Protein: 8 g/dL (ref 6.5–8.1)

## 2022-06-05 LAB — TROPONIN I (HIGH SENSITIVITY): Troponin I (High Sensitivity): 7 ng/L (ref <18)

## 2022-06-05 LAB — LIPASE, BLOOD: Lipase: 25 U/L (ref 11–51)

## 2022-06-05 MED ORDER — LIDOCAINE VISCOUS HCL 2 % MT SOLN
15.0000 mL | Freq: Once | OROMUCOSAL | Status: AC
  Administered 2022-06-05: 15 mL via ORAL
  Filled 2022-06-05: qty 15

## 2022-06-05 MED ORDER — IOHEXOL 350 MG/ML SOLN
100.0000 mL | Freq: Once | INTRAVENOUS | Status: AC | PRN
  Administered 2022-06-05: 100 mL via INTRAVENOUS

## 2022-06-05 MED ORDER — FENTANYL CITRATE PF 50 MCG/ML IJ SOSY
50.0000 ug | PREFILLED_SYRINGE | Freq: Once | INTRAMUSCULAR | Status: AC
  Administered 2022-06-05: 50 ug via INTRAVENOUS
  Filled 2022-06-05: qty 1

## 2022-06-05 MED ORDER — ONDANSETRON 4 MG PO TBDP
4.0000 mg | ORAL_TABLET | Freq: Once | ORAL | Status: AC
  Administered 2022-06-05: 4 mg via ORAL
  Filled 2022-06-05: qty 1

## 2022-06-05 MED ORDER — DROPERIDOL 2.5 MG/ML IJ SOLN
2.5000 mg | Freq: Once | INTRAMUSCULAR | Status: AC
  Administered 2022-06-05: 2.5 mg via INTRAVENOUS
  Filled 2022-06-05: qty 2

## 2022-06-05 MED ORDER — HYDROCODONE-ACETAMINOPHEN 5-325 MG PO TABS
1.0000 | ORAL_TABLET | Freq: Once | ORAL | Status: AC
  Administered 2022-06-05: 1 via ORAL
  Filled 2022-06-05: qty 1

## 2022-06-05 MED ORDER — LABETALOL HCL 5 MG/ML IV SOLN
20.0000 mg | Freq: Once | INTRAVENOUS | Status: AC
  Administered 2022-06-05: 20 mg via INTRAVENOUS
  Filled 2022-06-05: qty 4

## 2022-06-05 MED ORDER — LACTATED RINGERS IV BOLUS
1000.0000 mL | Freq: Once | INTRAVENOUS | Status: AC
  Administered 2022-06-05: 1000 mL via INTRAVENOUS

## 2022-06-05 MED ORDER — NITROGLYCERIN 2 % TD OINT
1.0000 [in_us] | TOPICAL_OINTMENT | Freq: Once | TRANSDERMAL | Status: AC
  Administered 2022-06-05: 1 [in_us] via TOPICAL
  Filled 2022-06-05: qty 1

## 2022-06-05 MED ORDER — IOHEXOL 350 MG/ML SOLN: 100.0000 mL | Freq: Once | INTRAVENOUS | Status: DC | PRN

## 2022-06-05 MED ORDER — ONDANSETRON HCL 4 MG/2ML IJ SOLN
4.0000 mg | Freq: Once | INTRAMUSCULAR | Status: AC
  Administered 2022-06-05: 4 mg via INTRAVENOUS
  Filled 2022-06-05: qty 2

## 2022-06-05 MED ORDER — IOHEXOL 300 MG/ML  SOLN
100.0000 mL | Freq: Once | INTRAMUSCULAR | Status: AC | PRN
  Administered 2022-06-05: 100 mL via INTRAVENOUS

## 2022-06-05 MED ORDER — ALUM & MAG HYDROXIDE-SIMETH 200-200-20 MG/5ML PO SUSP
30.0000 mL | Freq: Once | ORAL | Status: AC
  Administered 2022-06-05: 30 mL via ORAL
  Filled 2022-06-05: qty 30

## 2022-06-05 MED ORDER — ONDANSETRON 4 MG PO TBDP
4.0000 mg | ORAL_TABLET | Freq: Once | ORAL | Status: AC
  Administered 2022-06-05: 4 mg via ORAL

## 2022-06-05 MED ORDER — ONDANSETRON 4 MG PO TBDP
ORAL_TABLET | ORAL | Status: AC
  Filled 2022-06-05: qty 1

## 2022-06-05 NOTE — ED Provider Notes (Signed)
  [facility] DEPT Provider Note   CSN: [id number] Arrival date & time: 06/05/22  1215     History {Add pertinent medical, surgical, social history, OB history to HPI:1} Chief Complaint  Patient presents with   Emesis    Mischele Detter is a 71 y.o. female.   Emesis      Home Medications Prior to Admission medications   Medication Sig Start Date End Date Taking? Authorizing Provider  ibuprofen (ADVIL) 800 MG tablet TAKE 1 TABLET BY MOUTH EVERY 6 HOURS AS NEEDED FOR PAIN (MODERATE PAIN) 11/02/21   Laurey Morale, MD      Allergies    Codeine    Review of Systems   Review of Systems  Gastrointestinal:  Positive for vomiting.    Physical Exam Updated Vital Signs BP (!) 186/119 (BP Location: Left Arm)   Pulse 87   Temp (!) 97.5 F (36.4 C) (Axillary)   Resp (!) 22   SpO2 100%  Physical Exam  ED Results / Procedures / Treatments   Labs (all labs ordered are listed, but only abnormal results are displayed) Labs Reviewed  COMPREHENSIVE METABOLIC PANEL - Abnormal; Notable for the following components:      Result Value   Glucose, Bld 121 (*)    All other components within normal limits  URINALYSIS, ROUTINE W REFLEX MICROSCOPIC - Abnormal; Notable for the following components:   Color, Urine STRAW (*)    Glucose, UA 50 (*)    Ketones, ur 5 (*)    All other components within normal limits  LIPASE, BLOOD  CBC WITH DIFFERENTIAL/PLATELET    EKG None  Radiology No results found.  Procedures Procedures  {Document cardiac monitor, telemetry assessment procedure when appropriate:1}  Medications Ordered in ED Medications  ondansetron (ZOFRAN-ODT) 4 MG disintegrating tablet (has no administration in time range)  ondansetron (ZOFRAN-ODT) disintegrating tablet 4 mg (4 mg Oral Given 06/05/22 1251)  HYDROcodone-acetaminophen (NORCO/VICODIN) 5-325 MG per tablet 1 tablet (1 tablet Oral Given 06/05/22 1311)  iohexol (OMNIPAQUE) 300  MG/ML solution 100 mL (100 mLs Intravenous Contrast Given 06/05/22 1643)  ondansetron (ZOFRAN-ODT) disintegrating tablet 4 mg (4 mg Oral Given 06/05/22 1623)    ED Course/ Medical Decision Making/ A&P                           Medical Decision Making Risk Prescription drug management.   ***  {Document critical care time when appropriate:1} {Document review of labs and clinical decision tools ie heart score, Chads2Vasc2 etc:1}  {Document your independent review of radiology images, and any outside records:1} {Document your discussion with family members, caretakers, and with consultants:1} {Document social determinants of health affecting pt's care:1} {Document your decision making why or why not admission, treatments were needed:1} Final Clinical Impression(s) / ED Diagnoses Final diagnoses:  None    Rx / DC Orders ED Discharge Orders     None

## 2022-06-05 NOTE — ED Triage Notes (Signed)
Pt arrived via POV, c/o vomiting, abd pain, radiating to left flank and back.

## 2022-06-05 NOTE — ED Provider Triage Note (Signed)
Emergency Medicine Provider Triage Evaluation Note  Emie Sommerfeld , a 71 y.o. female  was evaluated in triage.  Pt complains of abdominal pain, nausea, and vomiting. She states that same began at 4 am this morning and has been persistent since. Pain is in epigastric region and radiates to her back. Denies any history of similar symptoms previously. No hx of abdominal surgeries.  Review of Systems  Positive:  Negative:   Physical Exam  BP (!) 186/119 (BP Location: Left Arm)   Pulse 87   Temp (!) 97.5 F (36.4 C) (Axillary)   Resp (!) 22   SpO2 100%  Gen:   Awake, no distress   Resp:  Normal effort  MSK:   Moves extremities without difficulty  Other:  Actively vomiting in triage  Medical Decision Making  Medically screening exam initiated at 12:47 PM.  Appropriate orders placed.  Jacqulin Brandenburger was informed that the remainder of the evaluation will be completed by another provider, this initial triage assessment does not replace that evaluation, and the importance of remaining in the ED until their evaluation is complete.     Bud Face, PA-C 06/05/22 1248

## 2022-06-06 ENCOUNTER — Encounter (HOSPITAL_COMMUNITY): Payer: Self-pay | Admitting: Family Medicine

## 2022-06-06 DIAGNOSIS — R109 Unspecified abdominal pain: Secondary | ICD-10-CM | POA: Diagnosis not present

## 2022-06-06 DIAGNOSIS — R1110 Vomiting, unspecified: Secondary | ICD-10-CM | POA: Diagnosis not present

## 2022-06-06 DIAGNOSIS — I160 Hypertensive urgency: Secondary | ICD-10-CM | POA: Diagnosis present

## 2022-06-06 DIAGNOSIS — R7989 Other specified abnormal findings of blood chemistry: Secondary | ICD-10-CM | POA: Diagnosis present

## 2022-06-06 LAB — COMPREHENSIVE METABOLIC PANEL
ALT: 13 U/L (ref 0–44)
ALT: 14 U/L (ref 0–44)
AST: 30 U/L (ref 15–41)
AST: 33 U/L (ref 15–41)
Albumin: 3.7 g/dL (ref 3.5–5.0)
Albumin: 3.8 g/dL (ref 3.5–5.0)
Alkaline Phosphatase: 45 U/L (ref 38–126)
Alkaline Phosphatase: 49 U/L (ref 38–126)
Anion gap: 10 (ref 5–15)
Anion gap: 7 (ref 5–15)
BUN: 9 mg/dL (ref 8–23)
BUN: 10 mg/dL (ref 8–23)
CO2: 24 mmol/L (ref 22–32)
CO2: 25 mmol/L (ref 22–32)
Calcium: 8.7 mg/dL — ABNORMAL LOW (ref 8.9–10.3)
Calcium: 8.7 mg/dL — ABNORMAL LOW (ref 8.9–10.3)
Chloride: 98 mmol/L (ref 98–111)
Chloride: 105 mmol/L (ref 98–111)
Creatinine, Ser: 0.77 mg/dL (ref 0.44–1.00)
Creatinine, Ser: 0.77 mg/dL (ref 0.44–1.00)
GFR, Estimated: >60 mL/min (ref >60)
GFR, Estimated: >60 mL/min (ref >60)
Glucose, Bld: 128 mg/dL — ABNORMAL HIGH (ref 70–99)
Glucose, Bld: 98 mg/dL (ref 70–99)
Potassium: 3.3 mmol/L — ABNORMAL LOW (ref 3.5–5.1)
Potassium: 4.9 mmol/L (ref 3.5–5.1)
Sodium: 132 mmol/L — ABNORMAL LOW (ref 135–145)
Sodium: 137 mmol/L (ref 135–145)
Total Bilirubin: 0.7 mg/dL (ref 0.3–1.2)
Total Bilirubin: 0.8 mg/dL (ref 0.3–1.2)
Total Protein: 6.9 g/dL (ref 6.5–8.1)
Total Protein: 7.5 g/dL (ref 6.5–8.1)

## 2022-06-06 LAB — MAGNESIUM
Magnesium: 1.4 mg/dL — ABNORMAL LOW (ref 1.7–2.4)
Magnesium: 1.1 mg/dL — ABNORMAL LOW (ref 1.7–2.4)

## 2022-06-06 LAB — BASIC METABOLIC PANEL
Anion gap: 2 — ABNORMAL LOW (ref 5–15)
BUN: 5 mg/dL — ABNORMAL LOW (ref 8–23)
CO2: 15 mmol/L — ABNORMAL LOW (ref 22–32)
Calcium: 4 mg/dL — CL (ref 8.9–10.3)
Chloride: 126 mmol/L — ABNORMAL HIGH (ref 98–111)
Creatinine, Ser: 0.34 mg/dL — ABNORMAL LOW (ref 0.44–1.00)
GFR, Estimated: >60 mL/min (ref >60)
Glucose, Bld: 59 mg/dL — ABNORMAL LOW (ref 70–99)
Potassium: <2 mmol/L — CL (ref 3.5–5.1)
Sodium: 143 mmol/L (ref 135–145)

## 2022-06-06 LAB — NA AND K (SODIUM & POTASSIUM), RAND UR
Potassium Urine: 40 mmol/L
Sodium, Ur: 237 mmol/L

## 2022-06-06 LAB — CBC
HCT: 37.5 % (ref 36.0–46.0)
Hemoglobin: 12.5 g/dL (ref 12.0–15.0)
MCH: 30.3 pg (ref 26.0–34.0)
MCHC: 33.3 g/dL (ref 30.0–36.0)
MCV: 91 fL (ref 80.0–100.0)
Platelets: 260 10*3/uL (ref 150–400)
RBC: 4.12 MIL/uL (ref 3.87–5.11)
RDW: 13 % (ref 11.5–15.5)
WBC: 9.6 10*3/uL (ref 4.0–10.5)
nRBC: 0 % (ref 0.0–0.2)

## 2022-06-06 LAB — LACTIC ACID, PLASMA
Lactic Acid, Venous: 3.4 mmol/L (ref 0.5–1.9)
Lactic Acid, Venous: 2.9 mmol/L (ref 0.5–1.9)
Lactic Acid, Venous: 6.5 mmol/L (ref 0.5–1.9)
Lactic Acid, Venous: 2.3 mmol/L (ref 0.5–1.9)

## 2022-06-06 MED ORDER — OXYCODONE HCL 5 MG PO TABS
5.0000 mg | ORAL_TABLET | ORAL | Status: DC | PRN
  Administered 2022-06-06 – 2022-06-10 (×5): 5 mg via ORAL
  Filled 2022-06-06 (×5): qty 1

## 2022-06-06 MED ORDER — LACTATED RINGERS IV BOLUS
1000.0000 mL | Freq: Once | INTRAVENOUS | Status: AC
  Administered 2022-06-06: 1000 mL via INTRAVENOUS

## 2022-06-06 MED ORDER — CHLORHEXIDINE GLUCONATE CLOTH 2 % EX PADS
6.0000 | MEDICATED_PAD | Freq: Every day | CUTANEOUS | Status: DC
  Administered 2022-06-07: 6 via TOPICAL

## 2022-06-06 MED ORDER — POTASSIUM CHLORIDE 10 MEQ/100ML IV SOLN
10.0000 meq | INTRAVENOUS | Status: AC
  Administered 2022-06-06 (×4): 10 meq via INTRAVENOUS
  Filled 2022-06-06 (×4): qty 100

## 2022-06-06 MED ORDER — CALCIUM GLUCONATE-NACL 1-0.675 GM/50ML-% IV SOLN
1.0000 g | Freq: Once | INTRAVENOUS | Status: AC
  Administered 2022-06-06: 1000 mg via INTRAVENOUS
  Filled 2022-06-06: qty 50

## 2022-06-06 MED ORDER — HYDROMORPHONE HCL 1 MG/ML IJ SOLN
0.5000 mg | INTRAMUSCULAR | Status: DC | PRN
  Administered 2022-06-06 – 2022-06-09 (×5): 0.5 mg via INTRAVENOUS
  Filled 2022-06-06 (×5): qty 1

## 2022-06-06 MED ORDER — SODIUM CHLORIDE 0.9% FLUSH
3.0000 mL | Freq: Two times a day (BID) | INTRAVENOUS | Status: DC
  Administered 2022-06-06 – 2022-06-10 (×10): 3 mL via INTRAVENOUS

## 2022-06-06 MED ORDER — POTASSIUM CHLORIDE CRYS ER 20 MEQ PO TBCR
40.0000 meq | EXTENDED_RELEASE_TABLET | ORAL | Status: AC
  Administered 2022-06-06 – 2022-06-07 (×4): 40 meq via ORAL
  Filled 2022-06-06 (×4): qty 2

## 2022-06-06 MED ORDER — MAGNESIUM SULFATE 4 GM/100ML IV SOLN
4.0000 g | Freq: Once | INTRAVENOUS | Status: AC
  Administered 2022-06-06: 4 g via INTRAVENOUS
  Filled 2022-06-06: qty 100

## 2022-06-06 MED ORDER — ENOXAPARIN SODIUM 40 MG/0.4ML IJ SOSY
40.0000 mg | PREFILLED_SYRINGE | INTRAMUSCULAR | Status: DC
  Administered 2022-06-06 – 2022-06-07 (×2): 40 mg via SUBCUTANEOUS
  Filled 2022-06-06 (×2): qty 0.4

## 2022-06-06 MED ORDER — SODIUM CHLORIDE 0.9 % IV SOLN
12.5000 mg | Freq: Four times a day (QID) | INTRAVENOUS | Status: DC | PRN
  Administered 2022-06-07: 12.5 mg via INTRAVENOUS
  Filled 2022-06-06: qty 12.5

## 2022-06-06 MED ORDER — METOPROLOL TARTRATE 12.5 MG HALF TABLET
12.5000 mg | ORAL_TABLET | Freq: Two times a day (BID) | ORAL | Status: DC
  Administered 2022-06-06 – 2022-06-10 (×9): 12.5 mg via ORAL
  Filled 2022-06-06 (×9): qty 1

## 2022-06-06 MED ORDER — ACETAMINOPHEN 325 MG PO TABS: 650.0000 mg | ORAL_TABLET | Freq: Four times a day (QID) | ORAL | Status: DC | PRN

## 2022-06-06 MED ORDER — SODIUM CHLORIDE 0.9 % IV SOLN: INTRAVENOUS | Status: DC

## 2022-06-06 MED ORDER — LACTATED RINGERS IV SOLN: INTRAVENOUS | Status: AC

## 2022-06-06 MED ORDER — ONDANSETRON HCL 4 MG/2ML IJ SOLN
4.0000 mg | Freq: Four times a day (QID) | INTRAMUSCULAR | Status: DC | PRN
  Administered 2022-06-07 (×2): 4 mg via INTRAVENOUS
  Filled 2022-06-06 (×3): qty 2

## 2022-06-06 MED ORDER — SODIUM CHLORIDE 0.45 % IV SOLN: INTRAVENOUS | Status: DC

## 2022-06-06 MED ORDER — LABETALOL HCL 5 MG/ML IV SOLN
10.0000 mg | INTRAVENOUS | Status: DC | PRN
  Administered 2022-06-07: 10 mg via INTRAVENOUS
  Filled 2022-06-06: qty 4

## 2022-06-06 NOTE — H&P (Signed)
History and Physical    Christy Kirby KPT:[id number] DOB: [birth year] DOA: 06/05/2022  PCP: Laurey Morale, MD   Patient coming from: Home   Chief Complaint: Abdominal and left flank pain, N/V   HPI: Christy Kirby is a pleasant 72 y.o. female who denies any significant past medical history now presents to the emergency department with pain in her abdomen, left flank, and nausea with nonbloody vomiting.  Patient reports that she developed the symptoms that 4 AM on 06/05/2022 and they have been persistent since.  She denies diarrhea, fever, chills, sick contacts, or urinary symptoms.  ED Course: Upon arrival to the ED, patient is found to be afebrile and saturating well on room air with severely elevated blood pressure.  CMP and CBC are unremarkable, troponin is normal, lipase was normal, and lactic acid was elevated to 2.8 and then 3.1.  There is no PE or aortic dissection or aneurysm on CTA, and no acute intra-abdominal or pelvic abnormalities on CT.  She was given 1 L of LR, 2 doses of Zofran, droperidol, Norco, fentanyl, Maalox, and 2 doses of IV labetalol in the ED.  Review of Systems:  All other systems reviewed and apart from HPI, are negative.  Past Medical History:  Diagnosis Date   Arthritis    hip   GERD (gastroesophageal reflux disease)    Hyperlipidemia     Past Surgical History:  Procedure Laterality Date   COLONOSCOPY  06/26/2017   per Dr. Henrene Pastor, benign polyps, repeat in 10 yrs    NEUROPLASTY / [place] Right 01/11/2018   Procedure: RIGHT TOTAL HIP ARTHROPLASTY ANTERIOR APPROACH;  Surgeon: Rod Can, MD;  Location: [facility];  Service: Orthopedics;  Laterality: Right;    Social History:   reports that she has quit smoking. Her smoking use included cigarettes. She has never used smokeless tobacco. She reports current alcohol use. She reports that she does not use  drugs.  Allergies  Allergen Reactions   Codeine Nausea Only    Family History  Problem Relation Age of Onset   Diabetes Sister    Colon cancer Neg Hx    Colon polyps Neg Hx    Esophageal cancer Neg Hx    Rectal cancer Neg Hx    Stomach cancer Neg Hx      Prior to Admission medications   Medication Sig Start Date End Date Taking? Authorizing Provider  ibuprofen (ADVIL) 800 MG tablet TAKE 1 TABLET BY MOUTH EVERY 6 HOURS AS NEEDED FOR PAIN (MODERATE PAIN) 11/02/21   Laurey Morale, MD    Physical Exam: Vitals:   06/06/22 0227 06/06/22 0230 06/06/22 0300 06/06/22 0330  BP:  (!) 168/90 (!) 158/88 (!) 169/97  Pulse:  84 88 89  Resp:  '14 16 17  '$ Temp: 98.3 F (36.8 C)     TempSrc: Oral     SpO2:  100% 100% 100%    Constitutional: NAD, calm  Eyes: PERTLA, lids and conjunctivae normal ENMT: Mucous membranes are moist. Posterior pharynx clear of any exudate or lesions.   Neck: supple, no masses  Respiratory:  no wheezing, no crackles. No accessory muscle use.  Cardiovascular: S1 & S2 heard, regular rate and rhythm. No extremity edema.   Abdomen: No distension, no tenderness, soft. Bowel sounds active.  Musculoskeletal: no clubbing / cyanosis. No joint deformity upper and lower extremities.   Skin: no significant  rashes, lesions, ulcers. Warm, dry, well-perfused. Neurologic: CN 2-12 grossly intact. Moving all extremities. Alert and oriented.  Psychiatric: Pleasant. Cooperative.    Labs and Imaging on Admission: I have personally reviewed following labs and imaging studies  CBC: Recent Labs  Lab 06/05/22 1247  WBC 7.1  NEUTROABS 5.9  HGB 12.9  HCT 39.0  MCV 91.1  PLT 734   Basic Metabolic Panel: Recent Labs  Lab 06/05/22 1247  NA 138  K 3.6  CL 104  CO2 23  GLUCOSE 121*  BUN 11  CREATININE 0.79  CALCIUM 9.5   GFR: CrCl cannot be calculated (Unknown ideal weight.). Liver Function Tests: Recent Labs  Lab 06/05/22 1247  AST 21  ALT 14  ALKPHOS 52   BILITOT 0.7  PROT 8.0  ALBUMIN 4.2   Recent Labs  Lab 06/05/22 1247  LIPASE 25   No results for input(s): "AMMONIA" in the last 168 hours. Coagulation Profile: No results for input(s): "INR", "PROTIME" in the last 168 hours. Cardiac Enzymes: No results for input(s): "CKTOTAL", "CKMB", "CKMBINDEX", "TROPONINI" in the last 168 hours. BNP (last 3 results) No results for input(s): "PROBNP" in the last 8760 hours. HbA1C: No results for input(s): "HGBA1C" in the last 72 hours. CBG: No results for input(s): "GLUCAP" in the last 168 hours. Lipid Profile: No results for input(s): "CHOL", "HDL", "LDLCALC", "TRIG", "CHOLHDL", "LDLDIRECT" in the last 72 hours. Thyroid Function Tests: No results for input(s): "TSH", "T4TOTAL", "FREET4", "T3FREE", "THYROIDAB" in the last 72 hours. Anemia Panel: No results for input(s): "VITAMINB12", "FOLATE", "FERRITIN", "TIBC", "IRON", "RETICCTPCT" in the last 72 hours. Urine analysis:    Component Value Date/Time   COLORURINE STRAW (A) 06/05/2022 1247   APPEARANCEUR CLEAR 06/05/2022 1247   LABSPEC 1.012 06/05/2022 1247   PHURINE 8.0 06/05/2022 1247   GLUCOSEU 50 (A) 06/05/2022 1247   GLUCOSEU NEGATIVE 09/30/2020 0838   HGBUR NEGATIVE 06/05/2022 1247   BILIRUBINUR NEGATIVE 06/05/2022 1247   BILIRUBINUR Negative 03/09/2018 1028   KETONESUR 5 (A) 06/05/2022 1247   PROTEINUR NEGATIVE 06/05/2022 1247   UROBILINOGEN 0.2 09/30/2020 0838   NITRITE NEGATIVE 06/05/2022 1247   LEUKOCYTESUR NEGATIVE 06/05/2022 1247   Sepsis Labs: '@LABRCNTIP'$ (procalcitonin:4,lacticidven:4) )No results found for this or any previous visit (from the past 240 hour(s)).   Radiological Exams on Admission: CT Angio Chest/Abd/Pel for Dissection W and/or Wo Contrast  Result Date: 06/05/2022 CLINICAL DATA:  Chest and abdominal pain, initial encounter EXAM: CT ANGIOGRAPHY CHEST, ABDOMEN AND PELVIS TECHNIQUE: Non-contrast CT of the chest was initially obtained. Multidetector CT  imaging through the chest, abdomen and pelvis was performed using the standard protocol during bolus administration of intravenous contrast. Multiplanar reconstructed images and MIPs were obtained and reviewed to evaluate the vascular anatomy. RADIATION DOSE REDUCTION: This exam was performed according to the departmental dose-optimization program which includes automated exposure control, adjustment of the mA and/or kV according to patient size and/or use of iterative reconstruction technique. CONTRAST:  155m OMNIPAQUE IOHEXOL 350 MG/ML SOLN COMPARISON:  CT of the abdomen pelvis from 5 hours previous FINDINGS: CTA CHEST FINDINGS Cardiovascular: Thoracic aorta demonstrates atherosclerotic calcifications without aneurysmal dilatation or dissection. No cardiac enlargement is seen. The pulmonary artery as visualized shows no central pulmonary emboli. Mediastinum/Nodes: Thoracic inlet is within normal limits. No hilar or mediastinal adenopathy is noted. The esophagus as visualized is within normal limits. Lungs/Pleura: The lungs are well aerated bilaterally. No focal infiltrate or sizable effusion is seen. No sizable parenchymal nodule is noted. Musculoskeletal: Degenerative changes of the  thoracic spine are seen. No acute rib abnormality is noted. Review of the MIP images confirms the above findings. CTA ABDOMEN AND PELVIS FINDINGS VASCULAR Aorta: Abdominal aorta is well visualized similar to that seen on the recent CT examination. No significant atherosclerotic calcifications are noted. No aneurysmal dilatation or dissection is noted. Celiac: Patent without evidence of aneurysm, dissection, vasculitis or significant stenosis. SMA: Patent without evidence of aneurysm, dissection, vasculitis or significant stenosis. Renals: Both renal arteries are patent without evidence of aneurysm, dissection, vasculitis, fibromuscular dysplasia or significant stenosis. IMA: Patent without evidence of aneurysm, dissection, vasculitis  or significant stenosis. Inflow: Iliacs are within normal limits. Mild atherosclerotic calcifications are seen. No focal stenosis or dissection is noted. Veins: None no specific venous abnormality is noted. Review of the MIP images confirms the above findings. NON-VASCULAR Hepatobiliary: Liver is within normal limits. Gallbladder is well distended with multiple dependent gallstones. No complicating factors are seen. Pancreas: Unremarkable. No pancreatic ductal dilatation or surrounding inflammatory changes. Spleen: Normal in size without focal abnormality. Adrenals/Urinary Tract: Adrenal glands are within normal limits. Kidneys demonstrate a normal enhancement pattern bilaterally. Normal excretion is noted bilaterally as well. No renal calculi are seen. The bladder is decompressed. Contrast enhanced urine is noted within. Stomach/Bowel: Colon shows no obstructive or inflammatory changes. The appendix is within normal limits. Small bowel and stomach are unremarkable. Lymphatic: No lymphadenopathy is seen. Reproductive: Lobular uterus is noted consistent with uterine fibroid change. The overall appearance is stable from 5 hours previous. Other: No abdominal wall hernia or abnormality. No abdominopelvic ascites. Musculoskeletal: Right hip arthroplasty. No acute bony abnormality is noted. Review of the MIP images confirms the above findings. IMPRESSION: CTA of the chest: No evidence of aortic dissection or aneurysmal dilatation. No evidence of pulmonary emboli. No acute abnormality noted. CTA of the abdomen and pelvis: The overall appearance is similar to that seen 5 hours previous. The aorta is again unremarkable. Cholelithiasis without complicating factors. Uterine fibroid change. Electronically Signed   By: Inez Catalina M.D.   On: 06/05/2022 21:45   CT ABDOMEN PELVIS W CONTRAST  Result Date: 06/05/2022 CLINICAL DATA:  Abdominal pain, acute nonlocalized. EXAM: CT ABDOMEN AND PELVIS WITH CONTRAST TECHNIQUE:  Multidetector CT imaging of the abdomen and pelvis was performed using the standard protocol following bolus administration of intravenous contrast. RADIATION DOSE REDUCTION: This exam was performed according to the departmental dose-optimization program which includes automated exposure control, adjustment of the mA and/or kV according to patient size and/or use of iterative reconstruction technique. CONTRAST:  155m OMNIPAQUE IOHEXOL 300 MG/ML  SOLN COMPARISON:  No recent relevant priors available for comparison at time of dictation. FINDINGS: Lower chest: No acute abnormality. Hepatobiliary: No acute osseous abnormality. Cholelithiasis without findings of acute cholecystitis. No biliary ductal dilation. Pancreas: No pancreatic ductal dilation or evidence of acute inflammation. Spleen: No splenomegaly. Adrenals/Urinary Tract: Bilateral adrenal glands appear normal. No hydronephrosis. Kidneys demonstrate symmetric enhancement. Urinary bladder is unremarkable for degree of distension. Stomach/Bowel: Stomach is unremarkable for degree of distension. No pathologic dilation of small or large bowel. The appendix and terminal ileum appear normal. No evidence of acute bowel inflammation. Evaluation of loops of bowel in the pelvis is limited by streak artifact from right hip arthroplasty. Vascular/Lymphatic: Normal caliber abdominal aorta. No pathologically enlarged abdominal or pelvic lymph nodes. Reproductive: Multiple masses in an enlarged lobular uterus measuring up to 4.1 cm commonly reflect uterine leiomyomas. No suspicious adnexal mass. Other: Trace pelvic free fluid. Musculoskeletal: Right total hip arthroplasty. Multilevel  degenerative changes spine. No acute osseous abnormality. IMPRESSION: 1. No acute abnormality in the abdomen or pelvis. 2. Cholelithiasis without findings of acute cholecystitis. 3. Multiple masses in an enlarged lobular uterus measuring up to 4.1 cm commonly reflect uterine leiomyomas.  Electronically Signed   By: Dahlia Bailiff M.D.   On: 06/05/2022 17:05    EKG: Independently reviewed. Sinus rhythm, incomplete RBBB, LAFB, LVH.   Assessment/Plan   1. Abdominal pain with N/V  - Exam is benign and no acute findings noted on CT in ED  - Continue IVF hydration and pain-control, monitor electrolytes, trial clears as she improves and advance diet as tolerated    2. Hypertensive urgency  - BP as high as 204/118 in ED  - She has hx of elevated BP managed with lifestyle interventions  - Improved with pain-control  - Continue pain-control and as-needed labetalol    3. Elevated lactate  - No SIRS criteria or apparent bacterial infection, suspect this is secondary to hypovolemia  - Continue IVF hydration, trend lactate    DVT prophylaxis: Lovenox  Code Status: Full  Level of Care: Level of care: Stepdown Family Communication: none present  Disposition Plan:  Patient is from: home  Anticipated d/c is to: Home  Anticipated d/c date is: 1/1 or 06/07/22 Patient currently: Pending pain-control, tolerance of adequate oral intake  Consults called: none  Admission status: observation     Vianne Bulls, MD [facility]  06/06/2022, 4:10 AM

## 2022-06-06 NOTE — Progress Notes (Signed)
PROGRESS NOTE    Liliane Mallis  DTO:[id number] DOB: [birth year] DOA: 06/05/2022 PCP: Laurey Morale, MD   Brief Narrative:  hpi per dr Myna Hidalgo Hildred Priest is a pleasant 72 y.o. female who denies any significant past medical history now presents to the emergency department with pain in her abdomen, left flank, and nausea with nonbloody vomiting.   Patient reports that she developed the symptoms that 4 AM on 06/05/2022 and they have been persistent since.  She denies diarrhea, fever, chills, sick contacts, or urinary symptoms.   ED Course: Upon arrival to the ED, patient is found to be afebrile and saturating well on room air with severely elevated blood pressure.  CMP and CBC are unremarkable, troponin is normal, lipase was normal, and lactic acid was elevated to 2.8 and then 3.1.  There is no PE or aortic dissection or aneurysm on CTA, and no acute intra-abdominal or pelvic abnormalities on CT.   She was given 1 L of LR, 2 doses of Zofran, droperidol, Norco, fentanyl, Maalox, and 2 doses of IV labetalol in the ED.    Assessment & Plan:   Principal Problem:   Abdominal pain with vomiting Active Problems:   Hypertensive urgency   Elevated lactic acid level   Multiple electrolyte abnormalities  Severe hypokalemia Hypocalcemia Hypomagnesemia Acidosis hyperchloremia -secondary to GI loss likely  Change ivf to half ns Replete electrolytes aggressively  Recheck labs q 4 may need bicarb drip after correction of electrolytes  Urine electrolytes   Lactic acidosis- resolving likely due to dehydration vomiting  no evidence of infection Ct chest abdomen pelvis nad  Htn continue metoprolol  Estimated body mass index is 23.3 kg/m as calculated from the following:   Height as of 04/05/22: '5\' 5"'$  (1.651 m).   Weight as of 04/05/22: 63.5 kg.  DVT prophylaxis:  Code Status:  Family Communication:  Disposition Plan:  Status is: Observation The patient remains OBS appropriate and  will d/c before 2 midnights.   Consultants:  none  Procedures: none Antimicrobials: none  Subjective: Feeling hungry No nausea vomiting today Wants to eat  Objective: Vitals:   06/06/22 0914 06/06/22 1213 06/06/22 1300 06/06/22 1314  BP: (!) 156/94 (!) 156/88 (!) 150/95   Pulse: 80 79 84   Resp:  20 18   Temp:    98.5 F (36.9 C)  TempSrc:    Oral  SpO2:  100% 100%     Intake/Output Summary (Last 24 hours) at 06/06/2022 1328 Last data filed at 06/06/2022 1037 Gross per 24 hour  Intake 2200 ml  Output 600 ml  Net 1600 ml   There were no vitals filed for this visit.  Examination:  General exam: Appears  in nad Respiratory system: Clear to auscultation. Respiratory effort normal. Cardiovascular system: S1 & S2 heard, RRR. No JVD, murmurs, rubs, gallops or clicks. No pedal edema. Gastrointestinal system: Abdomen is nondistended, soft and nontender. No organomegaly or masses felt. Normal bowel sounds heard. Central nervous system: Alert and oriented. No focal neurological deficits. Extremities: Symmetric 5 x 5 power. Skin: No rashes, lesions or ulcers Psychiatry: Judgement and insight appear normal. Mood & affect appropriate.     Data Reviewed: I have personally reviewed following labs and imaging studies  CBC: Recent Labs  Lab 06/05/22 1247 06/06/22 0523  WBC 7.1 9.6  NEUTROABS 5.9  --   HGB 12.9 12.5  HCT 39.0 37.5  MCV 91.1 91.0  PLT 271 983   Basic Metabolic Panel: Recent  Labs  Lab 06/05/22 1247 06/06/22 0523  NA 138 132*  K 3.6 3.3*  CL 104 98  CO2 23 24  GLUCOSE 121* 128*  BUN 11 9  CREATININE 0.79 0.77  CALCIUM 9.5 8.7*  MG  --  1.4*   GFR: CrCl cannot be calculated (Unknown ideal weight.). Liver Function Tests: Recent Labs  Lab 06/05/22 1247 06/06/22 0523  AST 21 30  ALT 14 13  ALKPHOS 52 45  BILITOT 0.7 0.7  PROT 8.0 6.9  ALBUMIN 4.2 3.7   Recent Labs  Lab 06/05/22 1247  LIPASE 25   No results for input(s): "AMMONIA" in  the last 168 hours. Coagulation Profile: No results for input(s): "INR", "PROTIME" in the last 168 hours. Cardiac Enzymes: No results for input(s): "CKTOTAL", "CKMB", "CKMBINDEX", "TROPONINI" in the last 168 hours. BNP (last 3 results) No results for input(s): "PROBNP" in the last 8760 hours. HbA1C: No results for input(s): "HGBA1C" in the last 72 hours. CBG: No results for input(s): "GLUCAP" in the last 168 hours. Lipid Profile: No results for input(s): "CHOL", "HDL", "LDLCALC", "TRIG", "CHOLHDL", "LDLDIRECT" in the last 72 hours. Thyroid Function Tests: No results for input(s): "TSH", "T4TOTAL", "FREET4", "T3FREE", "THYROIDAB" in the last 72 hours. Anemia Panel: No results for input(s): "VITAMINB12", "FOLATE", "FERRITIN", "TIBC", "IRON", "RETICCTPCT" in the last 72 hours. Sepsis Labs: Recent Labs  Lab 06/05/22 2218 06/06/22 0105 06/06/22 0930 06/06/22 1205  LATICACIDVEN 3.4* 2.9* 6.5* 2.3*    No results found for this or any previous visit (from the past 240 hour(s)).       Radiology Studies: CT Angio Chest/Abd/Pel for Dissection W and/or Wo Contrast  Result Date: 06/05/2022 CLINICAL DATA:  Chest and abdominal pain, initial encounter EXAM: CT ANGIOGRAPHY CHEST, ABDOMEN AND PELVIS TECHNIQUE: Non-contrast CT of the chest was initially obtained. Multidetector CT imaging through the chest, abdomen and pelvis was performed using the standard protocol during bolus administration of intravenous contrast. Multiplanar reconstructed images and MIPs were obtained and reviewed to evaluate the vascular anatomy. RADIATION DOSE REDUCTION: This exam was performed according to the departmental dose-optimization program which includes automated exposure control, adjustment of the mA and/or kV according to patient size and/or use of iterative reconstruction technique. CONTRAST:  167m OMNIPAQUE IOHEXOL 350 MG/ML SOLN COMPARISON:  CT of the abdomen pelvis from 5 hours previous FINDINGS: CTA CHEST  FINDINGS Cardiovascular: Thoracic aorta demonstrates atherosclerotic calcifications without aneurysmal dilatation or dissection. No cardiac enlargement is seen. The pulmonary artery as visualized shows no central pulmonary emboli. Mediastinum/Nodes: Thoracic inlet is within normal limits. No hilar or mediastinal adenopathy is noted. The esophagus as visualized is within normal limits. Lungs/Pleura: The lungs are well aerated bilaterally. No focal infiltrate or sizable effusion is seen. No sizable parenchymal nodule is noted. Musculoskeletal: Degenerative changes of the thoracic spine are seen. No acute rib abnormality is noted. Review of the MIP images confirms the above findings. CTA ABDOMEN AND PELVIS FINDINGS VASCULAR Aorta: Abdominal aorta is well visualized similar to that seen on the recent CT examination. No significant atherosclerotic calcifications are noted. No aneurysmal dilatation or dissection is noted. Celiac: Patent without evidence of aneurysm, dissection, vasculitis or significant stenosis. SMA: Patent without evidence of aneurysm, dissection, vasculitis or significant stenosis. Renals: Both renal arteries are patent without evidence of aneurysm, dissection, vasculitis, fibromuscular dysplasia or significant stenosis. IMA: Patent without evidence of aneurysm, dissection, vasculitis or significant stenosis. Inflow: Iliacs are within normal limits. Mild atherosclerotic calcifications are seen. No focal stenosis or dissection is noted.  Veins: None no specific venous abnormality is noted. Review of the MIP images confirms the above findings. NON-VASCULAR Hepatobiliary: Liver is within normal limits. Gallbladder is well distended with multiple dependent gallstones. No complicating factors are seen. Pancreas: Unremarkable. No pancreatic ductal dilatation or surrounding inflammatory changes. Spleen: Normal in size without focal abnormality. Adrenals/Urinary Tract: Adrenal glands are within normal limits.  Kidneys demonstrate a normal enhancement pattern bilaterally. Normal excretion is noted bilaterally as well. No renal calculi are seen. The bladder is decompressed. Contrast enhanced urine is noted within. Stomach/Bowel: Colon shows no obstructive or inflammatory changes. The appendix is within normal limits. Small bowel and stomach are unremarkable. Lymphatic: No lymphadenopathy is seen. Reproductive: Lobular uterus is noted consistent with uterine fibroid change. The overall appearance is stable from 5 hours previous. Other: No abdominal wall hernia or abnormality. No abdominopelvic ascites. Musculoskeletal: Right hip arthroplasty. No acute bony abnormality is noted. Review of the MIP images confirms the above findings. IMPRESSION: CTA of the chest: No evidence of aortic dissection or aneurysmal dilatation. No evidence of pulmonary emboli. No acute abnormality noted. CTA of the abdomen and pelvis: The overall appearance is similar to that seen 5 hours previous. The aorta is again unremarkable. Cholelithiasis without complicating factors. Uterine fibroid change. Electronically Signed   By: Inez Catalina M.D.   On: 06/05/2022 21:45   CT ABDOMEN PELVIS W CONTRAST  Result Date: 06/05/2022 CLINICAL DATA:  Abdominal pain, acute nonlocalized. EXAM: CT ABDOMEN AND PELVIS WITH CONTRAST TECHNIQUE: Multidetector CT imaging of the abdomen and pelvis was performed using the standard protocol following bolus administration of intravenous contrast. RADIATION DOSE REDUCTION: This exam was performed according to the departmental dose-optimization program which includes automated exposure control, adjustment of the mA and/or kV according to patient size and/or use of iterative reconstruction technique. CONTRAST:  152m OMNIPAQUE IOHEXOL 300 MG/ML  SOLN COMPARISON:  No recent relevant priors available for comparison at time of dictation. FINDINGS: Lower chest: No acute abnormality. Hepatobiliary: No acute osseous abnormality.  Cholelithiasis without findings of acute cholecystitis. No biliary ductal dilation. Pancreas: No pancreatic ductal dilation or evidence of acute inflammation. Spleen: No splenomegaly. Adrenals/Urinary Tract: Bilateral adrenal glands appear normal. No hydronephrosis. Kidneys demonstrate symmetric enhancement. Urinary bladder is unremarkable for degree of distension. Stomach/Bowel: Stomach is unremarkable for degree of distension. No pathologic dilation of small or large bowel. The appendix and terminal ileum appear normal. No evidence of acute bowel inflammation. Evaluation of loops of bowel in the pelvis is limited by streak artifact from right hip arthroplasty. Vascular/Lymphatic: Normal caliber abdominal aorta. No pathologically enlarged abdominal or pelvic lymph nodes. Reproductive: Multiple masses in an enlarged lobular uterus measuring up to 4.1 cm commonly reflect uterine leiomyomas. No suspicious adnexal mass. Other: Trace pelvic free fluid. Musculoskeletal: Right total hip arthroplasty. Multilevel degenerative changes spine. No acute osseous abnormality. IMPRESSION: 1. No acute abnormality in the abdomen or pelvis. 2. Cholelithiasis without findings of acute cholecystitis. 3. Multiple masses in an enlarged lobular uterus measuring up to 4.1 cm commonly reflect uterine leiomyomas. Electronically Signed   By: JDahlia BailiffM.D.   On: 06/05/2022 17:05        Scheduled Meds:  enoxaparin (LOVENOX) injection  40 mg Subcutaneous Q24H   metoprolol tartrate  12.5 mg Oral BID   sodium chloride flush  3 mL Intravenous Q12H   Continuous Infusions:  sodium chloride 150 mL/hr at 06/06/22 1054   potassium chloride 10 mEq (06/06/22 1206)   promethazine (PHENERGAN) injection (IM or IVPB)  LOS: 0 days    Time spent: 15 min  Georgette Shell, MD  06/06/2022, 1:28 PM

## 2022-06-06 NOTE — ED Notes (Signed)
Dr Zigmund Daniel returned request to speak with pt via phone. Pt gave permission to allow visitors to hear conversation. Dr Zigmund Daniel explained to pt her labs are abnormal due to ? Vomiting so much recently. Pt had all questions answered and was pleased with the call.

## 2022-06-06 NOTE — ED Notes (Signed)
Date and time results received: 06/06/22 12:15 AM  (use smartphrase ".now" to insert current time)  Test: Lactic acid Critical Value: 3.4  Name of Provider Notified: Opyd  Orders Received? Or Actions Taken?: Orders Received - See Orders for details

## 2022-06-07 ENCOUNTER — Encounter (HOSPITAL_COMMUNITY): Payer: Self-pay | Admitting: Family Medicine

## 2022-06-07 ENCOUNTER — Observation Stay (HOSPITAL_COMMUNITY): Payer: Medicare HMO

## 2022-06-07 DIAGNOSIS — R7989 Other specified abnormal findings of blood chemistry: Secondary | ICD-10-CM | POA: Diagnosis present

## 2022-06-07 DIAGNOSIS — Z96641 Presence of right artificial hip joint: Secondary | ICD-10-CM | POA: Diagnosis present

## 2022-06-07 DIAGNOSIS — R1084 Generalized abdominal pain: Secondary | ICD-10-CM | POA: Diagnosis not present

## 2022-06-07 DIAGNOSIS — E872 Acidosis, unspecified: Secondary | ICD-10-CM | POA: Diagnosis present

## 2022-06-07 DIAGNOSIS — Z87891 Personal history of nicotine dependence: Secondary | ICD-10-CM | POA: Diagnosis not present

## 2022-06-07 DIAGNOSIS — K449 Diaphragmatic hernia without obstruction or gangrene: Secondary | ICD-10-CM | POA: Diagnosis present

## 2022-06-07 DIAGNOSIS — R948 Abnormal results of function studies of other organs and systems: Secondary | ICD-10-CM | POA: Diagnosis present

## 2022-06-07 DIAGNOSIS — R252 Cramp and spasm: Secondary | ICD-10-CM | POA: Diagnosis present

## 2022-06-07 DIAGNOSIS — E860 Dehydration: Secondary | ICD-10-CM | POA: Diagnosis present

## 2022-06-07 DIAGNOSIS — R1110 Vomiting, unspecified: Secondary | ICD-10-CM | POA: Diagnosis not present

## 2022-06-07 DIAGNOSIS — R1013 Epigastric pain: Secondary | ICD-10-CM | POA: Diagnosis present

## 2022-06-07 DIAGNOSIS — M1990 Unspecified osteoarthritis, unspecified site: Secondary | ICD-10-CM | POA: Diagnosis not present

## 2022-06-07 DIAGNOSIS — R935 Abnormal findings on diagnostic imaging of other abdominal regions, including retroperitoneum: Secondary | ICD-10-CM | POA: Diagnosis not present

## 2022-06-07 DIAGNOSIS — K2900 Acute gastritis without bleeding: Secondary | ICD-10-CM | POA: Diagnosis present

## 2022-06-07 DIAGNOSIS — K31A11 Gastric intestinal metaplasia without dysplasia, involving the antrum: Secondary | ICD-10-CM | POA: Diagnosis not present

## 2022-06-07 DIAGNOSIS — I160 Hypertensive urgency: Secondary | ICD-10-CM | POA: Diagnosis present

## 2022-06-07 DIAGNOSIS — K8020 Calculus of gallbladder without cholecystitis without obstruction: Secondary | ICD-10-CM | POA: Diagnosis not present

## 2022-06-07 DIAGNOSIS — Z885 Allergy status to narcotic agent status: Secondary | ICD-10-CM | POA: Diagnosis not present

## 2022-06-07 DIAGNOSIS — M1610 Unilateral primary osteoarthritis, unspecified hip: Secondary | ICD-10-CM | POA: Diagnosis present

## 2022-06-07 DIAGNOSIS — I10 Essential (primary) hypertension: Secondary | ICD-10-CM | POA: Diagnosis present

## 2022-06-07 DIAGNOSIS — Z8719 Personal history of other diseases of the digestive system: Secondary | ICD-10-CM | POA: Diagnosis not present

## 2022-06-07 DIAGNOSIS — R188 Other ascites: Secondary | ICD-10-CM | POA: Diagnosis not present

## 2022-06-07 DIAGNOSIS — E785 Hyperlipidemia, unspecified: Secondary | ICD-10-CM | POA: Diagnosis present

## 2022-06-07 DIAGNOSIS — B9681 Helicobacter pylori [H. pylori] as the cause of diseases classified elsewhere: Secondary | ICD-10-CM | POA: Diagnosis present

## 2022-06-07 DIAGNOSIS — K2970 Gastritis, unspecified, without bleeding: Secondary | ICD-10-CM | POA: Diagnosis not present

## 2022-06-07 DIAGNOSIS — K219 Gastro-esophageal reflux disease without esophagitis: Secondary | ICD-10-CM | POA: Diagnosis present

## 2022-06-07 DIAGNOSIS — K2950 Unspecified chronic gastritis without bleeding: Secondary | ICD-10-CM | POA: Diagnosis not present

## 2022-06-07 DIAGNOSIS — K2289 Other specified disease of esophagus: Secondary | ICD-10-CM | POA: Diagnosis not present

## 2022-06-07 DIAGNOSIS — K529 Noninfective gastroenteritis and colitis, unspecified: Secondary | ICD-10-CM | POA: Diagnosis present

## 2022-06-07 DIAGNOSIS — E876 Hypokalemia: Secondary | ICD-10-CM | POA: Diagnosis present

## 2022-06-07 DIAGNOSIS — R933 Abnormal findings on diagnostic imaging of other parts of digestive tract: Secondary | ICD-10-CM | POA: Diagnosis present

## 2022-06-07 DIAGNOSIS — R112 Nausea with vomiting, unspecified: Secondary | ICD-10-CM

## 2022-06-07 DIAGNOSIS — R109 Unspecified abdominal pain: Secondary | ICD-10-CM | POA: Diagnosis not present

## 2022-06-07 DIAGNOSIS — Z86010 Personal history of colonic polyps: Secondary | ICD-10-CM | POA: Diagnosis not present

## 2022-06-07 DIAGNOSIS — E878 Other disorders of electrolyte and fluid balance, not elsewhere classified: Secondary | ICD-10-CM | POA: Diagnosis present

## 2022-06-07 HISTORY — DX: Nausea with vomiting, unspecified: R11.2

## 2022-06-07 LAB — BLOOD GAS, VENOUS
Acid-Base Excess: 2.4 mmol/L — ABNORMAL HIGH (ref 0.0–2.0)
Bicarbonate: 27.2 mmol/L (ref 20.0–28.0)
O2 Saturation: 95.5 %
Patient temperature: 37
pCO2, Ven: 42 mmHg — ABNORMAL LOW (ref 44–60)
pH, Ven: 7.42 (ref 7.25–7.43)
pO2, Ven: 68 mmHg — ABNORMAL HIGH (ref 32–45)

## 2022-06-07 LAB — LACTIC ACID, PLASMA
Lactic Acid, Venous: 1.1 mmol/L (ref 0.5–1.9)
Lactic Acid, Venous: 1.6 mmol/L (ref 0.5–1.9)
Lactic Acid, Venous: 1.7 mmol/L (ref 0.5–1.9)

## 2022-06-07 LAB — MRSA NEXT GEN BY PCR, NASAL: MRSA by PCR Next Gen: NOT DETECTED

## 2022-06-07 LAB — OSMOLALITY, URINE: Osmolality, Ur: 600 mOsm/kg (ref 300–900)

## 2022-06-07 LAB — MAGNESIUM: Magnesium: 2.9 mg/dL — ABNORMAL HIGH (ref 1.7–2.4)

## 2022-06-07 MED ORDER — IOHEXOL 300 MG/ML  SOLN
100.0000 mL | Freq: Once | INTRAMUSCULAR | Status: AC | PRN
  Administered 2022-06-07: 100 mL via INTRAVENOUS

## 2022-06-07 MED ORDER — SODIUM CHLORIDE 0.9 % IV SOLN: INTRAVENOUS | Status: DC

## 2022-06-07 MED ORDER — ORAL CARE MOUTH RINSE: 15.0000 mL | OROMUCOSAL | Status: DC | PRN

## 2022-06-07 MED ORDER — PANTOPRAZOLE SODIUM 40 MG IV SOLR
40.0000 mg | INTRAVENOUS | Status: DC
  Administered 2022-06-07 – 2022-06-08 (×2): 40 mg via INTRAVENOUS
  Filled 2022-06-07 (×2): qty 10

## 2022-06-07 NOTE — Consult Note (Signed)
Consultation  Referring Provider:   Dr. Rodena Piety Primary Care Physician:  Christy Morale, MD Primary Gastroenterologist:  Dr. Henrene Kirby       Reason for Consultation:  nausea and vomiting, with postprandial epigastric pain   Impression    Nausea and vomiting associated with postprandial epigastric and back pain Abrupt onset, nonbilious, no hematemesis. Associated lactic acidosis History of NSAID use, no other risk factors.  No melena no anemia.  Hypertensive urgency Being monitored by primary team No headaches, no focal neurodeficits.  Abnormal CT abdomen pelvis with contrast For CT without the reading, second CT showed 19 x 17 mm peripheral nodule enhancement differential Meckel's diverticulitis, nodal metastasis, underlying neoplasm interloop abscess possibilities. 03/26/2017 colonoscopy excellent prep, benign polyp recall 10 years. General surgery has been consulted. With recent normal colonoscopy doubtful this is Meckel's diverticulitis, symptoms do not seem to correlate.   Principal Problem:   Abdominal pain with vomiting Active Problems:   Hypertensive urgency   Elevated lactic acid level   Intractable nausea and vomiting    LOS: 0 days     Plan   -With acute onset, possible gastroenteritis.  However with abnormal CT and continuing symptoms we will plan for EGD tomorrow with Dr. Rush Kirby to evaluate further.  I thoroughly discussed the procedure to include nature, alternatives, benefits, and risks including but not limited to bleeding, perforation, infection, anesthesia/cardiac and pulmonary complications. Patient provides understanding and gave verbal consent to proceed.  -Protonix 40 mg IV QD. -Continue Zofran as needed. -Clear liquid diet, NPO at midnight. -Monitor hemoglobin. -Further recommendations from abnormal CT per Dr. Rush Kirby, can consider CT enterography this visit or repeat in 2 to 3 months.  Appreciate general surgery's input.   Thank you  for your kind consultation, we will continue to follow.         HPI:   Christy Kirby is a 72 y.o. female with past medical history significant for hypertension, GERD, arthritis, presents with abdominal pain, nausea and nonbloody emesis.  03/26/2017 colonoscopy Dr. Henrene Kirby excellent prep Suprep 2 mm polyp ascending colon, internal hemorrhoids, benign polyp recall 10 years.  Symptoms started abruptly 4 AM 06/05/2022 states after eating Christmas ham. Patient states she will have back pain with some radiation around to her flanks and this is when the nausea and vomiting starts.  Ate dinner last night, rice and greens beans, had back pain with nausea and vomiting of the food afterwards.  Denies bilious vomiting, hematemesis. Denies GERD or dysphagia.  Remote history of being on omeprazole. Patient normally has bowel movement every day formed, last bowel movement was Sunday loose.  Denies any hematochezia, melena mucus.   Patient has had chills but denies fever. Patient states she does have grandkids and they have had some sicknesses going around. Patient denies abdominal bloating, early satiety.  Was having bilateral leg cramps.  In the ER patient found afebrile, hemodynamically stable other than severely elevated blood pressure. CBC without anemia or leukocytosis.  Normal kidney and liver.  Normal troponin.  Normal lipase.  Lactic acid elevated 2.8 and went as high as 6. Treated with IV fluids and zofran. Replaced her potassium, magnesium and calcium.  06/05/2022 CT angio chest abdomen pelvis for dissection showed no PE, cholelithiasis without complicating factors, uterine fibroid. Started on protonix 06/07/22.  06/07/22 repeat CT abdomen pelvis with contrast for acute nonlocalized abdominal pain, nausea and vomiting shows stranding and ileal mesentery surrounding low-density area as seen on prior study 19  x 17 mm some peripheral and nodular enhancement differential diagnosis includes Meckel's  diverticulitis or cystic/necrotic lymph node or nodal metastasis in the ileal mesentery.  States periphery underlying neoplasm cannot be excluded, interloop abscess less likely, surgical consultation may be helpful.  No signs of pneumatosis or bowel compromise.  Trace free fluid in pelvis.  Patient states she has had a history of hypertension, not treated, thought it was stress-induced. Patient has hip arthritis, on diclofenac and ibuprofen as needed, most recently ibuprofen 800 mg, take sparingly though. Denies alcohol, drug use, smoking history.  Abnormal ED labs: Abnormal Labs Reviewed  COMPREHENSIVE METABOLIC PANEL - Abnormal; Notable for the following components:      Result Value   Glucose, Bld 121 (*)    All other components within normal limits  URINALYSIS, ROUTINE W REFLEX MICROSCOPIC - Abnormal; Notable for the following components:   Color, Urine STRAW (*)    Glucose, UA 50 (*)    Ketones, ur 5 (*)    All other components within normal limits  LACTIC ACID, PLASMA - Abnormal; Notable for the following components:   Lactic Acid, Venous 2.8 (*)    All other components within normal limits  LACTIC ACID, PLASMA - Abnormal; Notable for the following components:   Lactic Acid, Venous 3.1 (*)    All other components within normal limits  LACTIC ACID, PLASMA - Abnormal; Notable for the following components:   Lactic Acid, Venous 3.4 (*)    All other components within normal limits  LACTIC ACID, PLASMA - Abnormal; Notable for the following components:   Lactic Acid, Venous 2.9 (*)    All other components within normal limits  COMPREHENSIVE METABOLIC PANEL - Abnormal; Notable for the following components:   Sodium 132 (*)    Potassium 3.3 (*)    Glucose, Bld 128 (*)    Calcium 8.7 (*)    All other components within normal limits  MAGNESIUM - Abnormal; Notable for the following components:   Magnesium 1.4 (*)    All other components within normal limits  LACTIC ACID, PLASMA -  Abnormal; Notable for the following components:   Lactic Acid, Venous 6.5 (*)    All other components within normal limits  LACTIC ACID, PLASMA - Abnormal; Notable for the following components:   Lactic Acid, Venous 2.3 (*)    All other components within normal limits  BASIC METABOLIC PANEL - Abnormal; Notable for the following components:   Potassium <2.0 (*)    Chloride 126 (*)    CO2 15 (*)    Glucose, Bld 59 (*)    BUN 5 (*)    Creatinine, Ser 0.34 (*)    Calcium 4.0 (*)    Anion gap 2 (*)    All other components within normal limits  MAGNESIUM - Abnormal; Notable for the following components:   Magnesium 1.1 (*)    All other components within normal limits  BLOOD GAS, VENOUS - Abnormal; Notable for the following components:   pCO2, Ven 42 (*)    pO2, Ven 68 (*)    Acid-Base Excess 2.4 (*)    All other components within normal limits  COMPREHENSIVE METABOLIC PANEL - Abnormal; Notable for the following components:   Calcium 8.7 (*)    All other components within normal limits  MAGNESIUM - Abnormal; Notable for the following components:   Magnesium 2.9 (*)    All other components within normal limits     Past Medical History:  Diagnosis Date  Arthritis    hip   GERD (gastroesophageal reflux disease)    Hyperlipidemia    Intractable nausea and vomiting 06/07/2022    Surgical History:  She  has a past surgical history that includes Neuroplasty / transposition median nerve at carpal tunnel bilateral; Colonoscopy (06/26/2017); Polypectomy; and Total hip arthroplasty (Right, 01/11/2018). Family History:  Her family history includes Diabetes in her sister. Social History:   reports that she has quit smoking. Her smoking use included cigarettes. She has never used smokeless tobacco. She reports current alcohol use. She reports that she does not use drugs.  Prior to Admission medications   Medication Sig Start Date End Date Taking? Authorizing Provider  ibuprofen (ADVIL) 800  MG tablet TAKE 1 TABLET BY MOUTH EVERY 6 HOURS AS NEEDED FOR PAIN (MODERATE PAIN) Patient taking differently: Take 800 mg by mouth as needed for mild pain. 11/02/21  Yes Christy Morale, MD    Current Facility-Administered Medications  Medication Dose Route Frequency Provider Last Rate Last Admin   0.45 % sodium chloride infusion   Intravenous Continuous Georgette Shell, MD 100 mL/hr at 06/07/22 1343 New Bag at 06/07/22 1343   acetaminophen (TYLENOL) tablet 650 mg  650 mg Oral Q6H PRN Opyd, Ilene Qua, MD       Chlorhexidine Gluconate Cloth 2 % PADS 6 each  6 each Topical Daily Georgette Shell, MD   6 each at 06/07/22 0937   enoxaparin (LOVENOX) injection 40 mg  40 mg Subcutaneous Q24H Opyd, Ilene Qua, MD   40 mg at 06/07/22 4818   HYDROmorphone (DILAUDID) injection 0.5 mg  0.5 mg Intravenous Q4H PRN Vianne Bulls, MD   0.5 mg at 06/07/22 0647   labetalol (NORMODYNE) injection 10 mg  10 mg Intravenous Q2H PRN Opyd, Ilene Qua, MD   10 mg at 06/07/22 0004   metoprolol tartrate (LOPRESSOR) tablet 12.5 mg  12.5 mg Oral BID Georgette Shell, MD   12.5 mg at 06/07/22 0938   ondansetron [facility]) injection 4 mg  4 mg Intravenous Q6H PRN Opyd, Ilene Qua, MD   4 mg at 06/07/22 0004   Oral care mouth rinse  15 mL Mouth Rinse PRN Georgette Shell, MD       oxyCODONE (Oxy IR/ROXICODONE) immediate release tablet 5 mg  5 mg Oral Q4H PRN Opyd, Ilene Qua, MD   5 mg at 06/06/22 2317   pantoprazole (PROTONIX) injection 40 mg  40 mg Intravenous Q24H Georgette Shell, MD   40 mg at 06/07/22 5631   promethazine (PHENERGAN) 12.5 mg in sodium chloride 0.9 % 50 mL IVPB  12.5 mg Intravenous Q6H PRN Opyd, Ilene Qua, MD   Stopped at 06/07/22 0543   sodium chloride flush (NS) 0.9 % injection 3 mL  3 mL Intravenous Q12H Opyd, Ilene Qua, MD   3 mL at 06/07/22 4970    Allergies as of 06/05/2022 - Review Complete 06/05/2022  Allergen Reaction Noted   Codeine Nausea Only 08/24/2010    Review of Systems:     Constitutional: No weight loss, fever, chills, weakness or fatigue HEENT: Eyes: No change in vision               Ears, Nose, Throat:  No change in hearing or congestion Skin: No rash or itching Cardiovascular: No chest pain, chest pressure or palpitations   Respiratory: No SOB or cough Gastrointestinal: See HPI and otherwise negative Genitourinary: No dysuria or change in urinary frequency Neurological: No headache, dizziness  or syncope Musculoskeletal: No new muscle or joint pain Hematologic: No bleeding or bruising Psychiatric: No history of depression or anxiety     Physical Exam:  Vital signs in last 24 hours: Temp:  [97.8 F (36.6 C)-100.1 F (37.8 C)] 97.8 F (36.6 C) (01/02 1213) Pulse Rate:  [76-91] 88 (01/02 0900) Resp:  [11-21] 19 (01/02 0900) BP: (122-192)/(65-110) 122/71 (01/02 0900) SpO2:  [96 %-100 %] 96 % (01/02 0900) Weight:  [61.5 kg] 61.5 kg (01/02 0449)   Last BM recorded by nurses in past 5 days No data recorded  General:   Pleasant, well developed female in no acute distress Head:  Normocephalic and atraumatic. Eyes: sclerae anicteric,conjunctive pink  Heart:  regular rate and rhythm Pulm: Clear anteriorly; no wheezing Abdomen:  Soft, Obese AB, Active bowel sounds. mild tenderness in the lower abdomen. Without guarding and Without rebound, No organomegaly appreciated. Extremities:  Without edema. Msk:  Symmetrical without gross deformities. Peripheral pulses intact.  Neurologic:  Alert and  oriented x4;  No focal deficits.  Skin:   Dry and intact without significant lesions or rashes. Psychiatric:  Cooperative. Normal mood and affect.  LAB RESULTS: Recent Labs    06/05/22 1247 06/06/22 0523  WBC 7.1 9.6  HGB 12.9 12.5  HCT 39.0 37.5  PLT 271 260   BMET Recent Labs    06/06/22 0523 06/06/22 1508 06/06/22 2315  NA 132* 143 137  K 3.3* <2.0* 4.9  CL 98 126* 105  CO2 24 15* 25  GLUCOSE 128* 59* 98  BUN 9 5* 10  CREATININE 0.77 0.34*  0.77  CALCIUM 8.7* 4.0* 8.7*   LFT Recent Labs    06/06/22 2315  PROT 7.5  ALBUMIN 3.8  AST 33  ALT 14  ALKPHOS 49  BILITOT 0.8   PT/INR No results for input(s): "LABPROT", "INR" in the last 72 hours.  STUDIES: CT ABDOMEN PELVIS W CONTRAST  Result Date: 06/07/2022 CLINICAL DATA:  Acute nonlocalized abdominal pain in a 72 year old female. EXAM: CT ABDOMEN AND PELVIS WITH CONTRAST TECHNIQUE: Multidetector CT imaging of the abdomen and pelvis was performed using the standard protocol following bolus administration of intravenous contrast. RADIATION DOSE REDUCTION: This exam was performed according to the departmental dose-optimization program which includes automated exposure control, adjustment of the mA and/or kV according to patient size and/or use of iterative reconstruction technique. CONTRAST:  13m OMNIPAQUE IOHEXOL 300 MG/ML  SOLN COMPARISON:  June 05, 2022 FINDINGS: Lower chest: Lung bases with basilar atelectasis. No sign of pleural effusion or consolidative process. Hepatobiliary: Vicarious excretion of contrast material into the gallbladder. Cholelithiasis. No pericholecystic stranding. No biliary duct dilation. No focal, suspicious hepatic lesion. The portal vein is patent. Pancreas: Normal, without mass, inflammation or ductal dilatation. Spleen: Normal. Adrenals/Urinary Tract: Adrenal glands are normal. Symmetric renal enhancement without signs of hydronephrosis. Urinary bladder is largely obscured by streak artifact from RIGHT hip arthroplasty. No suspicious renal lesion. No substantial perinephric stranding. Stomach/Bowel: Stomach without signs of adjacent stranding. No sign of small bowel obstruction. Stranding in the ileal mesentery surrounding a low-density area. This low-density area measures 19 x 17 mm. There are surrounding small enhancing lymph nodes. There is no small bowel pneumatosis. There is no definitive communication of this area with the small bowel. The appendix  is normal. The above area in hindsight as seen on the prior study. There is suggestion of some peripheral and nodular enhancement on coronal images. No signs of acute colonic findings. Vascular/Lymphatic: Aortic atherosclerosis. No sign  of aneurysm. Smooth contour of the IVC. There is no gastrohepatic or hepatoduodenal ligament lymphadenopathy. No retroperitoneal or mesenteric lymphadenopathy. No pelvic sidewall lymphadenopathy. Reproductive: Uterus with multiple leiomyomata, not well assessed on CT due to streak artifact. Other: Trace free fluid in the pelvis, of uncertain significance. Musculoskeletal: Spinal degenerative changes without acute or destructive bone process. IMPRESSION: 1. Stranding in the ileal mesentery surrounding a low-density area in hindsight as seen on the prior study. This low-density area measures 19 x 17 mm. There is suggestion of some peripheral and nodular enhancement on coronal images. Findings are nonspecific and are of uncertain significance. Differential diagnosis would include Meckel's diverticulitis or cystic/necrotic lymph node or nodal metastasis in the ileal mesentery. Given nodular enhancement at the periphery underlying neoplasm would be difficult to exclude. Interloop abscess is felt less likely but given stranding not entirely excluded. Would also correlate with any signs of history of GI bleeding. Surgical consultation may be helpful. 2. No signs of pneumatosis or other secondary signs of bowel compromise. 3. Cholelithiasis without evidence of acute cholecystitis. 4. Trace free fluid in the pelvis, of uncertain significance. 5. Uterus with multiple leiomyomata, not well assessed on CT due to streak artifact from RIGHT hip arthroplasty. 6. Aortic atherosclerosis. Aortic Atherosclerosis (ICD10-I70.0). Electronically Signed   By: Zetta Bills M.D.   On: 06/07/2022 11:47   CT Angio Chest/Abd/Pel for Dissection W and/or Wo Contrast  Result Date: 06/05/2022 CLINICAL DATA:   Chest and abdominal pain, initial encounter EXAM: CT ANGIOGRAPHY CHEST, ABDOMEN AND PELVIS TECHNIQUE: Non-contrast CT of the chest was initially obtained. Multidetector CT imaging through the chest, abdomen and pelvis was performed using the standard protocol during bolus administration of intravenous contrast. Multiplanar reconstructed images and MIPs were obtained and reviewed to evaluate the vascular anatomy. RADIATION DOSE REDUCTION: This exam was performed according to the departmental dose-optimization program which includes automated exposure control, adjustment of the mA and/or kV according to patient size and/or use of iterative reconstruction technique. CONTRAST:  161m OMNIPAQUE IOHEXOL 350 MG/ML SOLN COMPARISON:  CT of the abdomen pelvis from 5 hours previous FINDINGS: CTA CHEST FINDINGS Cardiovascular: Thoracic aorta demonstrates atherosclerotic calcifications without aneurysmal dilatation or dissection. No cardiac enlargement is seen. The pulmonary artery as visualized shows no central pulmonary emboli. Mediastinum/Nodes: Thoracic inlet is within normal limits. No hilar or mediastinal adenopathy is noted. The esophagus as visualized is within normal limits. Lungs/Pleura: The lungs are well aerated bilaterally. No focal infiltrate or sizable effusion is seen. No sizable parenchymal nodule is noted. Musculoskeletal: Degenerative changes of the thoracic spine are seen. No acute rib abnormality is noted. Review of the MIP images confirms the above findings. CTA ABDOMEN AND PELVIS FINDINGS VASCULAR Aorta: Abdominal aorta is well visualized similar to that seen on the recent CT examination. No significant atherosclerotic calcifications are noted. No aneurysmal dilatation or dissection is noted. Celiac: Patent without evidence of aneurysm, dissection, vasculitis or significant stenosis. SMA: Patent without evidence of aneurysm, dissection, vasculitis or significant stenosis. Renals: Both renal arteries are  patent without evidence of aneurysm, dissection, vasculitis, fibromuscular dysplasia or significant stenosis. IMA: Patent without evidence of aneurysm, dissection, vasculitis or significant stenosis. Inflow: Iliacs are within normal limits. Mild atherosclerotic calcifications are seen. No focal stenosis or dissection is noted. Veins: None no specific venous abnormality is noted. Review of the MIP images confirms the above findings. NON-VASCULAR Hepatobiliary: Liver is within normal limits. Gallbladder is well distended with multiple dependent gallstones. No complicating factors are seen. Pancreas: Unremarkable. No pancreatic  ductal dilatation or surrounding inflammatory changes. Spleen: Normal in size without focal abnormality. Adrenals/Urinary Tract: Adrenal glands are within normal limits. Kidneys demonstrate a normal enhancement pattern bilaterally. Normal excretion is noted bilaterally as well. No renal calculi are seen. The bladder is decompressed. Contrast enhanced urine is noted within. Stomach/Bowel: Colon shows no obstructive or inflammatory changes. The appendix is within normal limits. Small bowel and stomach are unremarkable. Lymphatic: No lymphadenopathy is seen. Reproductive: Lobular uterus is noted consistent with uterine fibroid change. The overall appearance is stable from 5 hours previous. Other: No abdominal wall hernia or abnormality. No abdominopelvic ascites. Musculoskeletal: Right hip arthroplasty. No acute bony abnormality is noted. Review of the MIP images confirms the above findings. IMPRESSION: CTA of the chest: No evidence of aortic dissection or aneurysmal dilatation. No evidence of pulmonary emboli. No acute abnormality noted. CTA of the abdomen and pelvis: The overall appearance is similar to that seen 5 hours previous. The aorta is again unremarkable. Cholelithiasis without complicating factors. Uterine fibroid change. Electronically Signed   By: Inez Catalina M.D.   On: 06/05/2022  21:45   CT ABDOMEN PELVIS W CONTRAST  Result Date: 06/05/2022 CLINICAL DATA:  Abdominal pain, acute nonlocalized. EXAM: CT ABDOMEN AND PELVIS WITH CONTRAST TECHNIQUE: Multidetector CT imaging of the abdomen and pelvis was performed using the standard protocol following bolus administration of intravenous contrast. RADIATION DOSE REDUCTION: This exam was performed according to the departmental dose-optimization program which includes automated exposure control, adjustment of the mA and/or kV according to patient size and/or use of iterative reconstruction technique. CONTRAST:  168m OMNIPAQUE IOHEXOL 300 MG/ML  SOLN COMPARISON:  No recent relevant priors available for comparison at time of dictation. FINDINGS: Lower chest: No acute abnormality. Hepatobiliary: No acute osseous abnormality. Cholelithiasis without findings of acute cholecystitis. No biliary ductal dilation. Pancreas: No pancreatic ductal dilation or evidence of acute inflammation. Spleen: No splenomegaly. Adrenals/Urinary Tract: Bilateral adrenal glands appear normal. No hydronephrosis. Kidneys demonstrate symmetric enhancement. Urinary bladder is unremarkable for degree of distension. Stomach/Bowel: Stomach is unremarkable for degree of distension. No pathologic dilation of small or large bowel. The appendix and terminal ileum appear normal. No evidence of acute bowel inflammation. Evaluation of loops of bowel in the pelvis is limited by streak artifact from right hip arthroplasty. Vascular/Lymphatic: Normal caliber abdominal aorta. No pathologically enlarged abdominal or pelvic lymph nodes. Reproductive: Multiple masses in an enlarged lobular uterus measuring up to 4.1 cm commonly reflect uterine leiomyomas. No suspicious adnexal mass. Other: Trace pelvic free fluid. Musculoskeletal: Right total hip arthroplasty. Multilevel degenerative changes spine. No acute osseous abnormality. IMPRESSION: 1. No acute abnormality in the abdomen or pelvis. 2.  Cholelithiasis without findings of acute cholecystitis. 3. Multiple masses in an enlarged lobular uterus measuring up to 4.1 cm commonly reflect uterine leiomyomas. Electronically Signed   By: JDahlia BailiffM.D.   On: 06/05/2022 17:05     AVladimir Crofts 06/07/2022, 2:13 PM

## 2022-06-07 NOTE — Consult Note (Signed)
[facility] Surgery Consult Note  Tynia Wiers 19-Oct-1950  [id number].    Requesting MD: Landis Gandy Chief Complaint/Reason for Consult: ileal mesentery stranding  HPI:  Cristal Qadir is a 72 y.o. female who presented to [facility] 12/31 complaining of acute onset abdominal pain. Pain started around 4am that day. She had never had pain like this before. She reports diffuse abdominal pain radiating into her back. Associated with nausea and vomiting. She had one loose stool that day, passing flatus now but no further bowel movements. Denies blood in stool. No urinary symptoms and no vaginal bleeding. No definite sick contacts, but states that her grand kids may have had a GI bug recently.  Since admission her pain has improved but not fully resolved. Her abdomen is now more sore from vomiting. She was able to tolerate some liquids for lunch without any nausea or vomiting.   In the ED patient was found to be in hypertensive urgency. Initial CTA reports no acute abnormality in the abdomen or pelvis. She was admitted to the medical service. Due to recurrent abdominal pain, nausea, and vomiting with PO intake a CT scan was repeated today. This reports stranding in the ileal mesentery surrounding a low-density area in hindsight as seen on the prior study measuring 19 x 17 mm; there is suggestion of some peripheral and nodular enhancement; findings are nonspecific and of uncertain significance; differential diagnosis would include Meckel's diverticulitis or cystic/necrotic lymph node or nodal metastasis in the ileal mesentery; given nodular enhancement at the periphery underlying neoplasm would be difficult to exclude; interloop abscess is felt less likely but given stranding not entirely excluded; would also correlate with any signs of history of GI bleeding; surgical consultation may be helpful; no signs of pneumatosis or other secondary signs of bowel compromise. Given CT findings general surgery  asked to see.  Abdominal surgical history: none Anticoagulants: none Nonsmoker Denies alcohol or illicit drug use Last colonoscopy 2019 by Dr. Omar Person with polyp removal, she has never had an upper endoscopy Takes NSAIDs infrequently  ROS: ROS  All systems reviewed and otherwise negative except for as above  Family History  Problem Relation Age of Onset   Diabetes Sister    Colon cancer Neg Hx    Colon polyps Neg Hx    Esophageal cancer Neg Hx    Rectal cancer Neg Hx    Stomach cancer Neg Hx     Past Medical History:  Diagnosis Date   Arthritis    hip   GERD (gastroesophageal reflux disease)    Hyperlipidemia    Intractable nausea and vomiting 06/07/2022    Past Surgical History:  Procedure Laterality Date   COLONOSCOPY  06/26/2017   per Dr. Henrene Pastor, benign polyps, repeat in 10 yrs    NEUROPLASTY / [place] Right 01/11/2018   Procedure: RIGHT TOTAL HIP ARTHROPLASTY ANTERIOR APPROACH;  Surgeon: Rod Can, MD;  Location: [facility];  Service: Orthopedics;  Laterality: Right;    Social History:  reports that she has quit smoking. Her smoking use included cigarettes. She has never used smokeless tobacco. She reports current alcohol use. She reports that she does not use drugs.  Allergies:  Allergies  Allergen Reactions   Codeine Nausea Only    Medications Prior to Admission  Medication Sig Dispense Refill   ibuprofen (ADVIL) 800 MG tablet TAKE 1 TABLET BY MOUTH EVERY 6 HOURS AS  NEEDED FOR PAIN (MODERATE PAIN) (Patient taking differently: Take 800 mg by mouth as needed for mild pain.) 120 tablet 0    Prior to Admission medications   Medication Sig Start Date End Date Taking? Authorizing Provider  ibuprofen (ADVIL) 800 MG tablet TAKE 1 TABLET BY MOUTH EVERY 6 HOURS AS NEEDED FOR PAIN (MODERATE PAIN) Patient taking differently: Take 800 mg by mouth as needed for mild pain. 11/02/21   Yes Laurey Morale, MD    Blood pressure 122/71, pulse 88, temperature 97.8 F (36.6 C), temperature source Oral, resp. rate 19, height 5' 4.5" (1.638 m), weight 61.5 kg, SpO2 96 %. Physical Exam: General: pleasant, WD/WN female who is laying in bed in NAD HEENT: head is normocephalic, atraumatic.  Sclera are noninjected.  Pupils equal and round.  Ears and nose without any masses or lesions.  Mouth is pink and moist. Dentition fair Heart: regular, rate, and rhythm Lungs: CTAB, no wheezes, rhonchi, or rales noted.  Respiratory effort nonlabored Abd: soft, ND, +BS, no masses, hernias, or organomegaly. Mild lower abdominal TTP without rebound or guarding MS: no BUE/BLE edema, calves soft and nontender Skin: warm and dry with no masses, lesions, or rashes Psych: A&Ox4 with an appropriate affect Neuro: MAEs, no gross motor or sensory deficits BUE/BLE  Results for orders placed or performed during the hospital encounter of 06/05/22 (from the past 48 hour(s))  Lactic acid, plasma     Status: Abnormal   Collection Time: 06/05/22  5:54 PM  Result Value Ref Range   Lactic Acid, Venous 2.8 (HH) 0.5 - 1.9 mmol/L    Comment: CRITICAL RESULT CALLED TO, READ BACK BY AND VERIFIED WITH SAVOIE,B. 06/05/22 '@1849'$  BY SEEL,M. Performed at [facility], [place] [address] [place], [place] [address]   Lactic acid, plasma     Status: Abnormal   Collection Time: 06/05/22  7:35 PM  Result Value Ref Range   Lactic Acid, Venous 3.1 (HH) 0.5 - 1.9 mmol/L    Comment: CRITICAL RESULT CALLED TO, READ BACK BY AND VERIFIED WITH CALL ATTEMPTED 06/05/22 '@2028'$  GRAY,S. 06/05/22 '@2058'$  BY SEEL,M. Performed at [facility], [place] [address], [place],  [address]   Lactic acid, plasma     Status: Abnormal   Collection Time: 06/05/22 10:18 PM  Result Value Ref Range   Lactic Acid, Venous 3.4 (HH) 0.5 - 1.9 mmol/L    Comment: CRITICAL RESULT CALLED TO, READ BACK BY AND VERIFIED  WITH CALL TO RN AND CHARGE ATTEMPTED 06/05/22 '@2329'$  CALL TO RN AND CHARGE ATTEMPTED 06/05/22 '@2355'$  RBV BY LACIVITA,H. 06/06/22 '@0020'$  BY SEEL,M. Performed at [facility], [place] [address] [place], [place] [address]   Troponin I (High Sensitivity)     Status: None   Collection Time: 06/05/22 10:24 PM  Result Value Ref Range   Troponin I (High Sensitivity) 7 <18 ng/L    Comment: (NOTE) Elevated high sensitivity troponin I (hsTnI) values and significant  changes across serial measurements may suggest ACS but many other  chronic and acute conditions are known to elevate hsTnI results.  Refer to the "Links" section for chest pain algorithms and additional  guidance. Performed at [facility], [place] [address] [place], [place] [address]   Lactic acid, plasma     Status: Abnormal   Collection Time: 06/06/22  1:05 AM  Result Value Ref Range   Lactic Acid, Venous 2.9 (HH) 0.5 - 1.9 mmol/L    Comment: CRITICAL VALUE NOTED. VALUE  IS CONSISTENT WITH PREVIOUSLY REPORTED/CALLED VALUE Performed at [place] [address] [place], [place] [address]   Comprehensive metabolic panel     Status: Abnormal   Collection Time: 06/06/22  5:23 AM  Result Value Ref Range   Sodium 132 (L) 135 - 145 mmol/L   Potassium 3.3 (L) 3.5 - 5.1 mmol/L   Chloride 98 98 - 111 mmol/L   CO2 24 22 - 32 mmol/L   Glucose, Bld 128 (H) 70 - 99 mg/dL    Comment: Glucose reference range applies only to samples taken after fasting for at least 8 hours.   BUN 9 8 - 23 mg/dL   Creatinine, Ser 0.77 0.44 - 1.00 mg/dL   Calcium 8.7 (L) 8.9 - 10.3 mg/dL   Total Protein 6.9 6.5 - 8.1 g/dL   Albumin 3.7 3.5 - 5.0 g/dL   AST 30 15 - 41 U/L   ALT 13 0 - 44 U/L   Alkaline Phosphatase 45 38 - 126 U/L   Total Bilirubin 0.7 0.3 - 1.2 mg/dL   GFR, Estimated >60 >60 mL/min    Comment: (NOTE) Calculated using the CKD-EPI Creatinine Equation (2021)    Anion gap 10 5 - 15     Comment: Performed at [facility], [place] [address] [place], [place] [address]  CBC     Status: None   Collection Time: 06/06/22  5:23 AM  Result Value Ref Range   WBC 9.6 4.0 - 10.5 K/uL   RBC 4.12 3.87 - 5.11 MIL/uL   Hemoglobin 12.5 12.0 - 15.0 g/dL   HCT 37.5 36.0 - 46.0 %   MCV 91.0 80.0 - 100.0 fL   MCH 30.3 26.0 - 34.0 pg   MCHC 33.3 30.0 - 36.0 g/dL   RDW 13.0 11.5 - 15.5 %   Platelets 260 150 - 400 K/uL   nRBC 0.0 0.0 - 0.2 %    Comment: Performed at [facility], [place] [address], [place], [place] [address]  Magnesium     Status: Abnormal   Collection Time: 06/06/22  5:23 AM  Result Value Ref Range   Magnesium 1.4 (L) 1.7 - 2.4 mg/dL    Comment: Performed at [facility], [place] [address], [place], [place] [address]  Lactic acid, plasma     Status: Abnormal   Collection Time: 06/06/22  9:30 AM  Result Value Ref Range   Lactic Acid, Venous 6.5 (HH) 0.5 - 1.9 mmol/L    Comment: CRITICAL RESULT CALLED TO, READ BACK BY AND VERIFIED WITH BRUNSON,T RN ON 06/06/22 AT 1026 BY GOLSONM Performed at [place] [address], [place], [place] [address]   Lactic acid, plasma     Status: Abnormal   Collection Time: 06/06/22 12:05 PM  Result Value Ref Range   Lactic Acid, Venous 2.3 (HH) 0.5 - 1.9 mmol/L    Comment: CRITICAL RESULT CALLED TO, READ BACK BY AND VERIFIED WITH FOX,B RN AT 1300 06/06/22 BY TIBBITTS, K Performed at [place] [address], [place], [place] [address]   Basic metabolic panel     Status: Abnormal   Collection Time: 06/06/22  3:08 PM  Result Value Ref Range   Sodium 143 135 - 145 mmol/L    Comment: DELTA CHECK NOTED ELECTROLYTES REPEATED TO VERIFY    Potassium <2.0 (LL) 3.5 - 5.1 mmol/L    Comment: CRITICAL RESULT CALLED TO, READ BACK BY AND VERIFIED WITH RN  P DOWD AT 8144 06/06/22 [place] A ELECTROLYTES REPEATED TO VERIFY    Chloride 126 (H) 98 - 111  mmol/L    Comment: ELECTROLYTES REPEATED TO VERIFY   CO2 15 (L) 22 - 32 mmol/L    Comment: ELECTROLYTES REPEATED TO VERIFY   Glucose, Bld 59 (L) 70 - 99 mg/dL    Comment: Glucose reference range applies only to samples taken after fasting for at least 8 hours.   BUN 5 (L) 8 - 23 mg/dL   Creatinine, Ser 0.34 (L) 0.44 - 1.00 mg/dL   Calcium 4.0 (LL) 8.9 - 10.3 mg/dL    Comment: CRITICAL RESULT CALLED TO, READ BACK BY AND VERIFIED WITH RN P DOWD AT 1624 06/06/22 [place] A ELECTROLYTES REPEATED TO VERIFY    GFR, Estimated >60 >60 mL/min    Comment: (NOTE) Calculated using the CKD-EPI Creatinine Equation (2021)    Anion gap 2 (L) 5 - 15    Comment: Performed at [facility], [place] [address], [place], [place] [address]  Magnesium     Status: Abnormal   Collection Time: 06/06/22  3:08 PM  Result Value Ref Range   Magnesium 1.1 (L) 1.7 - 2.4 mg/dL    Comment: Performed at [facility], [place] [address], [place], [place] [address]  Comprehensive metabolic panel     Status: Abnormal   Collection Time: 06/06/22 11:15 PM  Result Value Ref Range   Sodium 137 135 - 145 mmol/L   Potassium 4.9 3.5 - 5.1 mmol/L   Chloride 105 98 - 111 mmol/L   CO2 25 22 - 32 mmol/L   Glucose, Bld 98 70 - 99 mg/dL    Comment: Glucose reference range applies only to samples taken after fasting for at least 8 hours.   BUN 10 8 - 23 mg/dL   Creatinine, Ser 0.77 0.44 - 1.00 mg/dL   Calcium 8.7 (L) 8.9 - 10.3 mg/dL    Comment: DELTA CHECK NOTED   Total Protein 7.5 6.5 - 8.1 g/dL   Albumin 3.8 3.5 - 5.0 g/dL   AST 33 15 - 41 U/L   ALT 14 0 - 44 U/L   Alkaline Phosphatase 49 38 - 126 U/L   Total Bilirubin 0.8 0.3 - 1.2 mg/dL   GFR, Estimated >60 >60 mL/min    Comment: (NOTE) Calculated using the CKD-EPI Creatinine Equation (2021)    Anion gap 7 5 - 15    Comment: Performed at [facility], [place] [address], [place], [place] [address]  MRSA Next Gen by PCR,  Nasal     Status: None   Collection Time: 06/07/22  1:02 AM   Specimen: Nasal Mucosa; Nasal Swab  Result Value Ref Range   MRSA by PCR Next Gen NOT DETECTED NOT DETECTED    Comment: (NOTE) The GeneXpert MRSA Assay (FDA approved for NASAL specimens only), is one component of a comprehensive MRSA colonization surveillance program. It is not intended to diagnose MRSA infection nor to guide or monitor treatment for MRSA infections. Test performance is not FDA approved in patients less than 32 years old. Performed at [facility], [place] [address], [place], [place] [address]   Blood gas, venous     Status: Abnormal   Collection Time: 06/07/22  2:45 AM  Result Value Ref Range   pH, Ven 7.42 7.25 - 7.43   pCO2, Ven 42 (L) 44 - 60 mmHg   pO2, Ven 68 (H) 32 - 45 mmHg  Bicarbonate 27.2 20.0 - 28.0 mmol/L   Acid-Base Excess 2.4 (H) 0.0 - 2.0 mmol/L   O2 Saturation 95.5 %   Patient temperature 37.0     Comment: Performed at [facility], [place] [address], [place], [place] [address]  Lactic acid, plasma     Status: None   Collection Time: 06/07/22  2:45 AM  Result Value Ref Range   Lactic Acid, Venous 1.1 0.5 - 1.9 mmol/L    Comment: Performed at [facility], [place] [address], [place], [place] [address]  Magnesium     Status: Abnormal   Collection Time: 06/07/22  3:34 AM  Result Value Ref Range   Magnesium 2.9 (H) 1.7 - 2.4 mg/dL    Comment: Performed at [facility], [place] [address], [place], [place] [address]  Lactic acid, plasma     Status: None   Collection Time: 06/07/22 10:20 AM  Result Value Ref Range   Lactic Acid, Venous 1.6 0.5 - 1.9 mmol/L    Comment: Performed at [facility], [place] [address], [place], [place] [address]  Lactic acid, plasma     Status: None   Collection Time: 06/07/22 12:55 PM  Result Value Ref Range   Lactic Acid, Venous 1.7 0.5 - 1.9 mmol/L    Comment: Performed at  [facility], [place] [address] [place], [place] [address]   CT ABDOMEN PELVIS W CONTRAST  Result Date: 06/07/2022 CLINICAL DATA:  Acute nonlocalized abdominal pain in a 72 year old female. EXAM: CT ABDOMEN AND PELVIS WITH CONTRAST TECHNIQUE: Multidetector CT imaging of the abdomen and pelvis was performed using the standard protocol following bolus administration of intravenous contrast. RADIATION DOSE REDUCTION: This exam was performed according to the departmental dose-optimization program which includes automated exposure control, adjustment of the mA and/or kV according to patient size and/or use of iterative reconstruction technique. CONTRAST:  162m OMNIPAQUE IOHEXOL 300 MG/ML  SOLN COMPARISON:  June 05, 2022 FINDINGS: Lower chest: Lung bases with basilar atelectasis. No sign of pleural effusion or consolidative process. Hepatobiliary: Vicarious excretion of contrast material into the gallbladder. Cholelithiasis. No pericholecystic stranding. No biliary duct dilation. No focal, suspicious hepatic lesion. The portal vein is patent. Pancreas: Normal, without mass, inflammation or ductal dilatation. Spleen: Normal. Adrenals/Urinary Tract: Adrenal glands are normal. Symmetric renal enhancement without signs of hydronephrosis. Urinary bladder is largely obscured by streak artifact from RIGHT hip arthroplasty. No suspicious renal lesion. No substantial perinephric stranding. Stomach/Bowel: Stomach without signs of adjacent stranding. No sign of small bowel obstruction. Stranding in the ileal mesentery surrounding a low-density area. This low-density area measures 19 x 17 mm. There are surrounding small enhancing lymph nodes. There is no small bowel pneumatosis. There is no definitive communication of this area with the small bowel. The appendix is normal. The above area in hindsight as seen on the prior study. There is suggestion of some peripheral and nodular enhancement on coronal images.  No signs of acute colonic findings. Vascular/Lymphatic: Aortic atherosclerosis. No sign of aneurysm. Smooth contour of the IVC. There is no gastrohepatic or hepatoduodenal ligament lymphadenopathy. No retroperitoneal or mesenteric lymphadenopathy. No pelvic sidewall lymphadenopathy. Reproductive: Uterus with multiple leiomyomata, not well assessed on CT due to streak artifact. Other: Trace free fluid in the pelvis, of uncertain significance. Musculoskeletal: Spinal degenerative changes without acute or destructive bone process. IMPRESSION: 1. Stranding in the ileal mesentery surrounding a low-density area in hindsight as seen on the prior study. This low-density area measures  19 x 17 mm. There is suggestion of some peripheral and nodular enhancement on coronal images. Findings are nonspecific and are of uncertain significance. Differential diagnosis would include Meckel's diverticulitis or cystic/necrotic lymph node or nodal metastasis in the ileal mesentery. Given nodular enhancement at the periphery underlying neoplasm would be difficult to exclude. Interloop abscess is felt less likely but given stranding not entirely excluded. Would also correlate with any signs of history of GI bleeding. Surgical consultation may be helpful. 2. No signs of pneumatosis or other secondary signs of bowel compromise. 3. Cholelithiasis without evidence of acute cholecystitis. 4. Trace free fluid in the pelvis, of uncertain significance. 5. Uterus with multiple leiomyomata, not well assessed on CT due to streak artifact from RIGHT hip arthroplasty. 6. Aortic atherosclerosis. Aortic Atherosclerosis (ICD10-I70.0). Electronically Signed   By: Zetta Bills M.D.   On: 06/07/2022 11:47   CT Angio Chest/Abd/Pel for Dissection W and/or Wo Contrast  Result Date: 06/05/2022 CLINICAL DATA:  Chest and abdominal pain, initial encounter EXAM: CT ANGIOGRAPHY CHEST, ABDOMEN AND PELVIS TECHNIQUE: Non-contrast CT of the chest was initially  obtained. Multidetector CT imaging through the chest, abdomen and pelvis was performed using the standard protocol during bolus administration of intravenous contrast. Multiplanar reconstructed images and MIPs were obtained and reviewed to evaluate the vascular anatomy. RADIATION DOSE REDUCTION: This exam was performed according to the departmental dose-optimization program which includes automated exposure control, adjustment of the mA and/or kV according to patient size and/or use of iterative reconstruction technique. CONTRAST:  168m OMNIPAQUE IOHEXOL 350 MG/ML SOLN COMPARISON:  CT of the abdomen pelvis from 5 hours previous FINDINGS: CTA CHEST FINDINGS Cardiovascular: Thoracic aorta demonstrates atherosclerotic calcifications without aneurysmal dilatation or dissection. No cardiac enlargement is seen. The pulmonary artery as visualized shows no central pulmonary emboli. Mediastinum/Nodes: Thoracic inlet is within normal limits. No hilar or mediastinal adenopathy is noted. The esophagus as visualized is within normal limits. Lungs/Pleura: The lungs are well aerated bilaterally. No focal infiltrate or sizable effusion is seen. No sizable parenchymal nodule is noted. Musculoskeletal: Degenerative changes of the thoracic spine are seen. No acute rib abnormality is noted. Review of the MIP images confirms the above findings. CTA ABDOMEN AND PELVIS FINDINGS VASCULAR Aorta: Abdominal aorta is well visualized similar to that seen on the recent CT examination. No significant atherosclerotic calcifications are noted. No aneurysmal dilatation or dissection is noted. Celiac: Patent without evidence of aneurysm, dissection, vasculitis or significant stenosis. SMA: Patent without evidence of aneurysm, dissection, vasculitis or significant stenosis. Renals: Both renal arteries are patent without evidence of aneurysm, dissection, vasculitis, fibromuscular dysplasia or significant stenosis. IMA: Patent without evidence of  aneurysm, dissection, vasculitis or significant stenosis. Inflow: Iliacs are within normal limits. Mild atherosclerotic calcifications are seen. No focal stenosis or dissection is noted. Veins: None no specific venous abnormality is noted. Review of the MIP images confirms the above findings. NON-VASCULAR Hepatobiliary: Liver is within normal limits. Gallbladder is well distended with multiple dependent gallstones. No complicating factors are seen. Pancreas: Unremarkable. No pancreatic ductal dilatation or surrounding inflammatory changes. Spleen: Normal in size without focal abnormality. Adrenals/Urinary Tract: Adrenal glands are within normal limits. Kidneys demonstrate a normal enhancement pattern bilaterally. Normal excretion is noted bilaterally as well. No renal calculi are seen. The bladder is decompressed. Contrast enhanced urine is noted within. Stomach/Bowel: Colon shows no obstructive or inflammatory changes. The appendix is within normal limits. Small bowel and stomach are unremarkable. Lymphatic: No lymphadenopathy is seen. Reproductive: Lobular uterus is noted consistent  with uterine fibroid change. The overall appearance is stable from 5 hours previous. Other: No abdominal wall hernia or abnormality. No abdominopelvic ascites. Musculoskeletal: Right hip arthroplasty. No acute bony abnormality is noted. Review of the MIP images confirms the above findings. IMPRESSION: CTA of the chest: No evidence of aortic dissection or aneurysmal dilatation. No evidence of pulmonary emboli. No acute abnormality noted. CTA of the abdomen and pelvis: The overall appearance is similar to that seen 5 hours previous. The aorta is again unremarkable. Cholelithiasis without complicating factors. Uterine fibroid change. Electronically Signed   By: Inez Catalina M.D.   On: 06/05/2022 21:45   CT ABDOMEN PELVIS W CONTRAST  Result Date: 06/05/2022 CLINICAL DATA:  Abdominal pain, acute nonlocalized. EXAM: CT ABDOMEN AND  PELVIS WITH CONTRAST TECHNIQUE: Multidetector CT imaging of the abdomen and pelvis was performed using the standard protocol following bolus administration of intravenous contrast. RADIATION DOSE REDUCTION: This exam was performed according to the departmental dose-optimization program which includes automated exposure control, adjustment of the mA and/or kV according to patient size and/or use of iterative reconstruction technique. CONTRAST:  110m OMNIPAQUE IOHEXOL 300 MG/ML  SOLN COMPARISON:  No recent relevant priors available for comparison at time of dictation. FINDINGS: Lower chest: No acute abnormality. Hepatobiliary: No acute osseous abnormality. Cholelithiasis without findings of acute cholecystitis. No biliary ductal dilation. Pancreas: No pancreatic ductal dilation or evidence of acute inflammation. Spleen: No splenomegaly. Adrenals/Urinary Tract: Bilateral adrenal glands appear normal. No hydronephrosis. Kidneys demonstrate symmetric enhancement. Urinary bladder is unremarkable for degree of distension. Stomach/Bowel: Stomach is unremarkable for degree of distension. No pathologic dilation of small or large bowel. The appendix and terminal ileum appear normal. No evidence of acute bowel inflammation. Evaluation of loops of bowel in the pelvis is limited by streak artifact from right hip arthroplasty. Vascular/Lymphatic: Normal caliber abdominal aorta. No pathologically enlarged abdominal or pelvic lymph nodes. Reproductive: Multiple masses in an enlarged lobular uterus measuring up to 4.1 cm commonly reflect uterine leiomyomas. No suspicious adnexal mass. Other: Trace pelvic free fluid. Musculoskeletal: Right total hip arthroplasty. Multilevel degenerative changes spine. No acute osseous abnormality. IMPRESSION: 1. No acute abnormality in the abdomen or pelvis. 2. Cholelithiasis without findings of acute cholecystitis. 3. Multiple masses in an enlarged lobular uterus measuring up to 4.1 cm commonly  reflect uterine leiomyomas. Electronically Signed   By: JDahlia BailiffM.D.   On: 06/05/2022 17:05    Anti-infectives (From admission, onward)    None        Assessment/Plan Abdominal pain, nausea, vomiting - Patient with acute onset abdominal pain, nausea, and vomiting without history of similar symptoms previously. General surgery asked to see given abnormal CT scan report: stranding in the ileal mesentery surrounding a low-density area in hindsight as seen on the prior study measuring 19 x 17 mm; there is suggestion of some peripheral and nodular enhancement; findings are nonspecific and of uncertain significance; differential diagnosis would include Meckel's diverticulitis or cystic/necrotic lymph node or nodal metastasis in the ileal mesentery; given nodular enhancement at the periphery underlying neoplasm would be difficult to exclude; interloop abscess is felt less likely but given stranding not entirely excluded; would also correlate with any signs of history of GI bleeding; surgical consultation may be helpful; no signs of pneumatosis or other secondary signs of bowel compromise - Patient overall seems improved. She is tolerating clear liquids today. CT scan reviewed with MD, do not feel strongly that this is the source of her abdominal pain. She is not  obstructed, no signs of pneumatosis or pneumoperitoneum, no indication for acute surgical intervention. She may have a gastroenteritis. GI is seeing as well. Agree with advancing diet as tolerated. If patient improves and is able to tolerate a diet and go home, could consider repeat imaging (CT enterography) in about 3 months for further evaluation.   ID - none VTE - lovenox  FEN - reg diet Foley - none  Hypertensive urgency  I reviewed hospitalist notes, last 24 h vitals and pain scores, last 48 h intake and output, last 24 h labs and trends, and last 24 h imaging results.   Wellington Hampshire, [facility] 06/07/2022, 1:46 PM Please see Amion for pager number during day hours 7:00am-4:30pm

## 2022-06-07 NOTE — H&P (View-Only) (Signed)
Consultation  Referring Provider:   Dr. Rodena Piety Primary Care Physician:  Laurey Morale, MD Primary Gastroenterologist:  Dr. Henrene Pastor       Reason for Consultation:  nausea and vomiting, with postprandial epigastric pain   Impression    Nausea and vomiting associated with postprandial epigastric and back pain Abrupt onset, nonbilious, no hematemesis. Associated lactic acidosis History of NSAID use, no other risk factors.  No melena no anemia.  Hypertensive urgency Being monitored by primary team No headaches, no focal neurodeficits.  Abnormal CT abdomen pelvis with contrast For CT without the reading, second CT showed 19 x 17 mm peripheral nodule enhancement differential Meckel's diverticulitis, nodal metastasis, underlying neoplasm interloop abscess possibilities. 03/26/2017 colonoscopy excellent prep, benign polyp recall 10 years. General surgery has been consulted. With recent normal colonoscopy doubtful this is Meckel's diverticulitis, symptoms do not seem to correlate.   Principal Problem:   Abdominal pain with vomiting Active Problems:   Hypertensive urgency   Elevated lactic acid level   Intractable nausea and vomiting    LOS: 0 days     Plan   -With acute onset, possible gastroenteritis.  However with abnormal CT and continuing symptoms we will plan for EGD tomorrow with Dr. Rush Landmark to evaluate further.  I thoroughly discussed the procedure to include nature, alternatives, benefits, and risks including but not limited to bleeding, perforation, infection, anesthesia/cardiac and pulmonary complications. Patient provides understanding and gave verbal consent to proceed.  -Protonix 40 mg IV QD. -Continue Zofran as needed. -Clear liquid diet, NPO at midnight. -Monitor hemoglobin. -Further recommendations from abnormal CT per Dr. Rush Landmark, can consider CT enterography this visit or repeat in 2 to 3 months.  Appreciate general surgery's input.   Thank you  for your kind consultation, we will continue to follow.         HPI:   Christy Kirby is a 72 y.o. female with past medical history significant for hypertension, GERD, arthritis, presents with abdominal pain, nausea and nonbloody emesis.  03/26/2017 colonoscopy Dr. Henrene Pastor excellent prep Suprep 2 mm polyp ascending colon, internal hemorrhoids, benign polyp recall 10 years.  Symptoms started abruptly 4 AM 06/05/2022 states after eating Christmas ham. Patient states she will have back pain with some radiation around to her flanks and this is when the nausea and vomiting starts.  Ate dinner last night, rice and greens beans, had back pain with nausea and vomiting of the food afterwards.  Denies bilious vomiting, hematemesis. Denies GERD or dysphagia.  Remote history of being on omeprazole. Patient normally has bowel movement every day formed, last bowel movement was Sunday loose.  Denies any hematochezia, melena mucus.   Patient has had chills but denies fever. Patient states she does have grandkids and they have had some sicknesses going around. Patient denies abdominal bloating, early satiety.  Was having bilateral leg cramps.  In the ER patient found afebrile, hemodynamically stable other than severely elevated blood pressure. CBC without anemia or leukocytosis.  Normal kidney and liver.  Normal troponin.  Normal lipase.  Lactic acid elevated 2.8 and went as high as 6. Treated with IV fluids and zofran. Replaced her potassium, magnesium and calcium.  06/05/2022 CT angio chest abdomen pelvis for dissection showed no PE, cholelithiasis without complicating factors, uterine fibroid. Started on protonix 06/07/22.  06/07/22 repeat CT abdomen pelvis with contrast for acute nonlocalized abdominal pain, nausea and vomiting shows stranding and ileal mesentery surrounding low-density area as seen on prior study 19  x 17 mm some peripheral and nodular enhancement differential diagnosis includes Meckel's  diverticulitis or cystic/necrotic lymph node or nodal metastasis in the ileal mesentery.  States periphery underlying neoplasm cannot be excluded, interloop abscess less likely, surgical consultation may be helpful.  No signs of pneumatosis or bowel compromise.  Trace free fluid in pelvis.  Patient states she has had a history of hypertension, not treated, thought it was stress-induced. Patient has hip arthritis, on diclofenac and ibuprofen as needed, most recently ibuprofen 800 mg, take sparingly though. Denies alcohol, drug use, smoking history.  Abnormal ED labs: Abnormal Labs Reviewed  COMPREHENSIVE METABOLIC PANEL - Abnormal; Notable for the following components:      Result Value   Glucose, Bld 121 (*)    All other components within normal limits  URINALYSIS, ROUTINE W REFLEX MICROSCOPIC - Abnormal; Notable for the following components:   Color, Urine STRAW (*)    Glucose, UA 50 (*)    Ketones, ur 5 (*)    All other components within normal limits  LACTIC ACID, PLASMA - Abnormal; Notable for the following components:   Lactic Acid, Venous 2.8 (*)    All other components within normal limits  LACTIC ACID, PLASMA - Abnormal; Notable for the following components:   Lactic Acid, Venous 3.1 (*)    All other components within normal limits  LACTIC ACID, PLASMA - Abnormal; Notable for the following components:   Lactic Acid, Venous 3.4 (*)    All other components within normal limits  LACTIC ACID, PLASMA - Abnormal; Notable for the following components:   Lactic Acid, Venous 2.9 (*)    All other components within normal limits  COMPREHENSIVE METABOLIC PANEL - Abnormal; Notable for the following components:   Sodium 132 (*)    Potassium 3.3 (*)    Glucose, Bld 128 (*)    Calcium 8.7 (*)    All other components within normal limits  MAGNESIUM - Abnormal; Notable for the following components:   Magnesium 1.4 (*)    All other components within normal limits  LACTIC ACID, PLASMA -  Abnormal; Notable for the following components:   Lactic Acid, Venous 6.5 (*)    All other components within normal limits  LACTIC ACID, PLASMA - Abnormal; Notable for the following components:   Lactic Acid, Venous 2.3 (*)    All other components within normal limits  BASIC METABOLIC PANEL - Abnormal; Notable for the following components:   Potassium <2.0 (*)    Chloride 126 (*)    CO2 15 (*)    Glucose, Bld 59 (*)    BUN 5 (*)    Creatinine, Ser 0.34 (*)    Calcium 4.0 (*)    Anion gap 2 (*)    All other components within normal limits  MAGNESIUM - Abnormal; Notable for the following components:   Magnesium 1.1 (*)    All other components within normal limits  BLOOD GAS, VENOUS - Abnormal; Notable for the following components:   pCO2, Ven 42 (*)    pO2, Ven 68 (*)    Acid-Base Excess 2.4 (*)    All other components within normal limits  COMPREHENSIVE METABOLIC PANEL - Abnormal; Notable for the following components:   Calcium 8.7 (*)    All other components within normal limits  MAGNESIUM - Abnormal; Notable for the following components:   Magnesium 2.9 (*)    All other components within normal limits     Past Medical History:  Diagnosis Date  Arthritis    hip   GERD (gastroesophageal reflux disease)    Hyperlipidemia    Intractable nausea and vomiting 06/07/2022    Surgical History:  She  has a past surgical history that includes Neuroplasty / transposition median nerve at carpal tunnel bilateral; Colonoscopy (06/26/2017); Polypectomy; and Total hip arthroplasty (Right, 01/11/2018). Family History:  Her family history includes Diabetes in her sister. Social History:   reports that she has quit smoking. Her smoking use included cigarettes. She has never used smokeless tobacco. She reports current alcohol use. She reports that she does not use drugs.  Prior to Admission medications   Medication Sig Start Date End Date Taking? Authorizing Provider  ibuprofen (ADVIL) 800  MG tablet TAKE 1 TABLET BY MOUTH EVERY 6 HOURS AS NEEDED FOR PAIN (MODERATE PAIN) Patient taking differently: Take 800 mg by mouth as needed for mild pain. 11/02/21  Yes Laurey Morale, MD    Current Facility-Administered Medications  Medication Dose Route Frequency Provider Last Rate Last Admin   0.45 % sodium chloride infusion   Intravenous Continuous Georgette Shell, MD 100 mL/hr at 06/07/22 1343 New Bag at 06/07/22 1343   acetaminophen (TYLENOL) tablet 650 mg  650 mg Oral Q6H PRN Opyd, Ilene Qua, MD       Chlorhexidine Gluconate Cloth 2 % PADS 6 each  6 each Topical Daily Georgette Shell, MD   6 each at 06/07/22 0937   enoxaparin (LOVENOX) injection 40 mg  40 mg Subcutaneous Q24H Opyd, Ilene Qua, MD   40 mg at 06/07/22 2297   HYDROmorphone (DILAUDID) injection 0.5 mg  0.5 mg Intravenous Q4H PRN Vianne Bulls, MD   0.5 mg at 06/07/22 0647   labetalol (NORMODYNE) injection 10 mg  10 mg Intravenous Q2H PRN Opyd, Ilene Qua, MD   10 mg at 06/07/22 0004   metoprolol tartrate (LOPRESSOR) tablet 12.5 mg  12.5 mg Oral BID Georgette Shell, MD   12.5 mg at 06/07/22 0938   ondansetron [facility]) injection 4 mg  4 mg Intravenous Q6H PRN Opyd, Ilene Qua, MD   4 mg at 06/07/22 0004   Oral care mouth rinse  15 mL Mouth Rinse PRN Georgette Shell, MD       oxyCODONE (Oxy IR/ROXICODONE) immediate release tablet 5 mg  5 mg Oral Q4H PRN Opyd, Ilene Qua, MD   5 mg at 06/06/22 2317   pantoprazole (PROTONIX) injection 40 mg  40 mg Intravenous Q24H Georgette Shell, MD   40 mg at 06/07/22 9892   promethazine (PHENERGAN) 12.5 mg in sodium chloride 0.9 % 50 mL IVPB  12.5 mg Intravenous Q6H PRN Opyd, Ilene Qua, MD   Stopped at 06/07/22 0543   sodium chloride flush (NS) 0.9 % injection 3 mL  3 mL Intravenous Q12H Opyd, Ilene Qua, MD   3 mL at 06/07/22 1194    Allergies as of 06/05/2022 - Review Complete 06/05/2022  Allergen Reaction Noted   Codeine Nausea Only 08/24/2010    Review of Systems:     Constitutional: No weight loss, fever, chills, weakness or fatigue HEENT: Eyes: No change in vision               Ears, Nose, Throat:  No change in hearing or congestion Skin: No rash or itching Cardiovascular: No chest pain, chest pressure or palpitations   Respiratory: No SOB or cough Gastrointestinal: See HPI and otherwise negative Genitourinary: No dysuria or change in urinary frequency Neurological: No headache, dizziness  or syncope Musculoskeletal: No new muscle or joint pain Hematologic: No bleeding or bruising Psychiatric: No history of depression or anxiety     Physical Exam:  Vital signs in last 24 hours: Temp:  [97.8 F (36.6 C)-100.1 F (37.8 C)] 97.8 F (36.6 C) (01/02 1213) Pulse Rate:  [76-91] 88 (01/02 0900) Resp:  [11-21] 19 (01/02 0900) BP: (122-192)/(65-110) 122/71 (01/02 0900) SpO2:  [96 %-100 %] 96 % (01/02 0900) Weight:  [61.5 kg] 61.5 kg (01/02 0449)   Last BM recorded by nurses in past 5 days No data recorded  General:   Pleasant, well developed female in no acute distress Head:  Normocephalic and atraumatic. Eyes: sclerae anicteric,conjunctive pink  Heart:  regular rate and rhythm Pulm: Clear anteriorly; no wheezing Abdomen:  Soft, Obese AB, Active bowel sounds. mild tenderness in the lower abdomen. Without guarding and Without rebound, No organomegaly appreciated. Extremities:  Without edema. Msk:  Symmetrical without gross deformities. Peripheral pulses intact.  Neurologic:  Alert and  oriented x4;  No focal deficits.  Skin:   Dry and intact without significant lesions or rashes. Psychiatric:  Cooperative. Normal mood and affect.  LAB RESULTS: Recent Labs    06/05/22 1247 06/06/22 0523  WBC 7.1 9.6  HGB 12.9 12.5  HCT 39.0 37.5  PLT 271 260   BMET Recent Labs    06/06/22 0523 06/06/22 1508 06/06/22 2315  NA 132* 143 137  K 3.3* <2.0* 4.9  CL 98 126* 105  CO2 24 15* 25  GLUCOSE 128* 59* 98  BUN 9 5* 10  CREATININE 0.77 0.34*  0.77  CALCIUM 8.7* 4.0* 8.7*   LFT Recent Labs    06/06/22 2315  PROT 7.5  ALBUMIN 3.8  AST 33  ALT 14  ALKPHOS 49  BILITOT 0.8   PT/INR No results for input(s): "LABPROT", "INR" in the last 72 hours.  STUDIES: CT ABDOMEN PELVIS W CONTRAST  Result Date: 06/07/2022 CLINICAL DATA:  Acute nonlocalized abdominal pain in a 72 year old female. EXAM: CT ABDOMEN AND PELVIS WITH CONTRAST TECHNIQUE: Multidetector CT imaging of the abdomen and pelvis was performed using the standard protocol following bolus administration of intravenous contrast. RADIATION DOSE REDUCTION: This exam was performed according to the departmental dose-optimization program which includes automated exposure control, adjustment of the mA and/or kV according to patient size and/or use of iterative reconstruction technique. CONTRAST:  196m OMNIPAQUE IOHEXOL 300 MG/ML  SOLN COMPARISON:  June 05, 2022 FINDINGS: Lower chest: Lung bases with basilar atelectasis. No sign of pleural effusion or consolidative process. Hepatobiliary: Vicarious excretion of contrast material into the gallbladder. Cholelithiasis. No pericholecystic stranding. No biliary duct dilation. No focal, suspicious hepatic lesion. The portal vein is patent. Pancreas: Normal, without mass, inflammation or ductal dilatation. Spleen: Normal. Adrenals/Urinary Tract: Adrenal glands are normal. Symmetric renal enhancement without signs of hydronephrosis. Urinary bladder is largely obscured by streak artifact from RIGHT hip arthroplasty. No suspicious renal lesion. No substantial perinephric stranding. Stomach/Bowel: Stomach without signs of adjacent stranding. No sign of small bowel obstruction. Stranding in the ileal mesentery surrounding a low-density area. This low-density area measures 19 x 17 mm. There are surrounding small enhancing lymph nodes. There is no small bowel pneumatosis. There is no definitive communication of this area with the small bowel. The appendix  is normal. The above area in hindsight as seen on the prior study. There is suggestion of some peripheral and nodular enhancement on coronal images. No signs of acute colonic findings. Vascular/Lymphatic: Aortic atherosclerosis. No sign  of aneurysm. Smooth contour of the IVC. There is no gastrohepatic or hepatoduodenal ligament lymphadenopathy. No retroperitoneal or mesenteric lymphadenopathy. No pelvic sidewall lymphadenopathy. Reproductive: Uterus with multiple leiomyomata, not well assessed on CT due to streak artifact. Other: Trace free fluid in the pelvis, of uncertain significance. Musculoskeletal: Spinal degenerative changes without acute or destructive bone process. IMPRESSION: 1. Stranding in the ileal mesentery surrounding a low-density area in hindsight as seen on the prior study. This low-density area measures 19 x 17 mm. There is suggestion of some peripheral and nodular enhancement on coronal images. Findings are nonspecific and are of uncertain significance. Differential diagnosis would include Meckel's diverticulitis or cystic/necrotic lymph node or nodal metastasis in the ileal mesentery. Given nodular enhancement at the periphery underlying neoplasm would be difficult to exclude. Interloop abscess is felt less likely but given stranding not entirely excluded. Would also correlate with any signs of history of GI bleeding. Surgical consultation may be helpful. 2. No signs of pneumatosis or other secondary signs of bowel compromise. 3. Cholelithiasis without evidence of acute cholecystitis. 4. Trace free fluid in the pelvis, of uncertain significance. 5. Uterus with multiple leiomyomata, not well assessed on CT due to streak artifact from RIGHT hip arthroplasty. 6. Aortic atherosclerosis. Aortic Atherosclerosis (ICD10-I70.0). Electronically Signed   By: Zetta Bills M.D.   On: 06/07/2022 11:47   CT Angio Chest/Abd/Pel for Dissection W and/or Wo Contrast  Result Date: 06/05/2022 CLINICAL DATA:   Chest and abdominal pain, initial encounter EXAM: CT ANGIOGRAPHY CHEST, ABDOMEN AND PELVIS TECHNIQUE: Non-contrast CT of the chest was initially obtained. Multidetector CT imaging through the chest, abdomen and pelvis was performed using the standard protocol during bolus administration of intravenous contrast. Multiplanar reconstructed images and MIPs were obtained and reviewed to evaluate the vascular anatomy. RADIATION DOSE REDUCTION: This exam was performed according to the departmental dose-optimization program which includes automated exposure control, adjustment of the mA and/or kV according to patient size and/or use of iterative reconstruction technique. CONTRAST:  136m OMNIPAQUE IOHEXOL 350 MG/ML SOLN COMPARISON:  CT of the abdomen pelvis from 5 hours previous FINDINGS: CTA CHEST FINDINGS Cardiovascular: Thoracic aorta demonstrates atherosclerotic calcifications without aneurysmal dilatation or dissection. No cardiac enlargement is seen. The pulmonary artery as visualized shows no central pulmonary emboli. Mediastinum/Nodes: Thoracic inlet is within normal limits. No hilar or mediastinal adenopathy is noted. The esophagus as visualized is within normal limits. Lungs/Pleura: The lungs are well aerated bilaterally. No focal infiltrate or sizable effusion is seen. No sizable parenchymal nodule is noted. Musculoskeletal: Degenerative changes of the thoracic spine are seen. No acute rib abnormality is noted. Review of the MIP images confirms the above findings. CTA ABDOMEN AND PELVIS FINDINGS VASCULAR Aorta: Abdominal aorta is well visualized similar to that seen on the recent CT examination. No significant atherosclerotic calcifications are noted. No aneurysmal dilatation or dissection is noted. Celiac: Patent without evidence of aneurysm, dissection, vasculitis or significant stenosis. SMA: Patent without evidence of aneurysm, dissection, vasculitis or significant stenosis. Renals: Both renal arteries are  patent without evidence of aneurysm, dissection, vasculitis, fibromuscular dysplasia or significant stenosis. IMA: Patent without evidence of aneurysm, dissection, vasculitis or significant stenosis. Inflow: Iliacs are within normal limits. Mild atherosclerotic calcifications are seen. No focal stenosis or dissection is noted. Veins: None no specific venous abnormality is noted. Review of the MIP images confirms the above findings. NON-VASCULAR Hepatobiliary: Liver is within normal limits. Gallbladder is well distended with multiple dependent gallstones. No complicating factors are seen. Pancreas: Unremarkable. No pancreatic  ductal dilatation or surrounding inflammatory changes. Spleen: Normal in size without focal abnormality. Adrenals/Urinary Tract: Adrenal glands are within normal limits. Kidneys demonstrate a normal enhancement pattern bilaterally. Normal excretion is noted bilaterally as well. No renal calculi are seen. The bladder is decompressed. Contrast enhanced urine is noted within. Stomach/Bowel: Colon shows no obstructive or inflammatory changes. The appendix is within normal limits. Small bowel and stomach are unremarkable. Lymphatic: No lymphadenopathy is seen. Reproductive: Lobular uterus is noted consistent with uterine fibroid change. The overall appearance is stable from 5 hours previous. Other: No abdominal wall hernia or abnormality. No abdominopelvic ascites. Musculoskeletal: Right hip arthroplasty. No acute bony abnormality is noted. Review of the MIP images confirms the above findings. IMPRESSION: CTA of the chest: No evidence of aortic dissection or aneurysmal dilatation. No evidence of pulmonary emboli. No acute abnormality noted. CTA of the abdomen and pelvis: The overall appearance is similar to that seen 5 hours previous. The aorta is again unremarkable. Cholelithiasis without complicating factors. Uterine fibroid change. Electronically Signed   By: Inez Catalina M.D.   On: 06/05/2022  21:45   CT ABDOMEN PELVIS W CONTRAST  Result Date: 06/05/2022 CLINICAL DATA:  Abdominal pain, acute nonlocalized. EXAM: CT ABDOMEN AND PELVIS WITH CONTRAST TECHNIQUE: Multidetector CT imaging of the abdomen and pelvis was performed using the standard protocol following bolus administration of intravenous contrast. RADIATION DOSE REDUCTION: This exam was performed according to the departmental dose-optimization program which includes automated exposure control, adjustment of the mA and/or kV according to patient size and/or use of iterative reconstruction technique. CONTRAST:  128m OMNIPAQUE IOHEXOL 300 MG/ML  SOLN COMPARISON:  No recent relevant priors available for comparison at time of dictation. FINDINGS: Lower chest: No acute abnormality. Hepatobiliary: No acute osseous abnormality. Cholelithiasis without findings of acute cholecystitis. No biliary ductal dilation. Pancreas: No pancreatic ductal dilation or evidence of acute inflammation. Spleen: No splenomegaly. Adrenals/Urinary Tract: Bilateral adrenal glands appear normal. No hydronephrosis. Kidneys demonstrate symmetric enhancement. Urinary bladder is unremarkable for degree of distension. Stomach/Bowel: Stomach is unremarkable for degree of distension. No pathologic dilation of small or large bowel. The appendix and terminal ileum appear normal. No evidence of acute bowel inflammation. Evaluation of loops of bowel in the pelvis is limited by streak artifact from right hip arthroplasty. Vascular/Lymphatic: Normal caliber abdominal aorta. No pathologically enlarged abdominal or pelvic lymph nodes. Reproductive: Multiple masses in an enlarged lobular uterus measuring up to 4.1 cm commonly reflect uterine leiomyomas. No suspicious adnexal mass. Other: Trace pelvic free fluid. Musculoskeletal: Right total hip arthroplasty. Multilevel degenerative changes spine. No acute osseous abnormality. IMPRESSION: 1. No acute abnormality in the abdomen or pelvis. 2.  Cholelithiasis without findings of acute cholecystitis. 3. Multiple masses in an enlarged lobular uterus measuring up to 4.1 cm commonly reflect uterine leiomyomas. Electronically Signed   By: JDahlia BailiffM.D.   On: 06/05/2022 17:05     AVladimir Crofts 06/07/2022, 2:13 PM

## 2022-06-07 NOTE — Progress Notes (Addendum)
PROGRESS NOTE    Christy Kirby  WNI:[id number] DOB: 30-Dec-1950 DOA: 06/05/2022 PCP: Laurey Morale, MD    Brief Narrative: 72 year old female with no significant past medical history admitted with abdominal pain and middle back pain with nausea and vomiting. She had no urinary symptoms.   CT of the chest abdomen and pelvis did not show any acute findings. Her lactic acid level was elevated.  She was treated symptomatically with IV fluids and Zofran  Her electrolytes were low with low potassium magnesium and calcium these were all replaced. Patient ate a regular diet last night since she did not have any vomiting yesterday.  But she vomited the dinner again last night and now complains of epigastric pain.     Assessment & Plan:   Principal Problem:   Abdominal pain with vomiting Active Problems:   Hypertensive urgency   Elevated lactic acid level   #1 nausea and vomiting with epigastric pain -possible gastritis/gastroenteritis no history of peptic ulcer disease.  Change diet to clear liquids if tolerated. Continue IV fluids. Start Protonix. Repeat CT scan 06/07/22-ilial mesentery stranding, low-density area measuring 19 x 17 mm.  Concern for lymph node mets necrotic lymph node Meckel's diverticulitis  For this reason GI and general surgery was consulted. General surgery thinks this is more of gastroenteritis. GI planning on upper endoscopy tomorrow.  #2 hypokalemia resolved with repletion  #3 hypomagnesemia resolved  #4 hypocalcemia resolved with IV calcium gluconate  #5 acidosis and hyperchloremia improved with changing IV fluids  #6 lactic acidosis resolved after IV hydration.  No evidence of infection.  #7 hypertensive urgency on admission  she received multiple doses of as needed IV labetalol.  Started her on metoprolol continue.    Estimated body mass index is 22.91 kg/m as calculated from the following:   Height as of this encounter: 5' 4.5" (1.638 m).   Weight  as of this encounter: 61.5 kg.  DVT prophylaxis: Lovenox Code Status: Full code Family Communication: None at bedside today  disposition Plan:  Status is: Observation The patient remains OBS appropriate and will d/c before 2 midnights.   Consultants:  None  Procedures: None  antimicrobials: None  Subjective: Complains of vomiting dinner that she had last night with abdominal pain  Objective: Vitals:   06/07/22 0742 06/07/22 0800 06/07/22 0809 06/07/22 0900  BP:  127/71  122/71  Pulse:  78 80 88  Resp:  '11 11 19  '$ Temp: 98.2 F (36.8 C)     TempSrc: Oral     SpO2:  97% 97% 96%  Weight:      Height:        Intake/Output Summary (Last 24 hours) at 06/07/2022 0950 Last data filed at 06/07/2022 3818 Gross per 24 hour  Intake 4753 ml  Output 600 ml  Net 4153 ml   Filed Weights   06/07/22 0000 06/07/22 0449  Weight: 61.5 kg 61.5 kg    Examination:  General exam: Appears in distress due to nausea vomiting and abdominal pain Respiratory system: Clear to auscultation. Respiratory effort normal. Cardiovascular system: S1 & S2 heard, RRR. No JVD, murmurs, rubs, gallops or clicks. No pedal edema. Gastrointestinal system: Abdomen is nondistended, soft and epigastric tender. No organomegaly or masses felt. Normal bowel sounds heard. Central nervous system: Alert and oriented. No focal neurological deficits. Extremities: Symmetric 5 x 5 power. Skin: No rashes, lesions or ulcers Psychiatry: Judgement and insight appear normal. Mood & affect appropriate.     Data Reviewed: I  have personally reviewed following labs and imaging studies  CBC: Recent Labs  Lab 06/05/22 1247 06/06/22 0523  WBC 7.1 9.6  NEUTROABS 5.9  --   HGB 12.9 12.5  HCT 39.0 37.5  MCV 91.1 91.0  PLT 271 712   Basic Metabolic Panel: Recent Labs  Lab 06/05/22 1247 06/06/22 0523 06/06/22 1508 06/06/22 2315 06/07/22 0334  NA 138 132* 143 137  --   K 3.6 3.3* <2.0* 4.9  --   CL 104 98 126* 105  --    CO2 23 24 15* 25  --   GLUCOSE 121* 128* 59* 98  --   BUN 11 9 5* 10  --   CREATININE 0.79 0.77 0.34* 0.77  --   CALCIUM 9.5 8.7* 4.0* 8.7*  --   MG  --  1.4* 1.1*  --  2.9*   GFR: Estimated Creatinine Clearance: 55.3 mL/min (by C-G formula based on SCr of 0.77 mg/dL). Liver Function Tests: Recent Labs  Lab 06/05/22 1247 06/06/22 0523 06/06/22 2315  AST 21 30 33  ALT '14 13 14  '$ ALKPHOS 52 45 49  BILITOT 0.7 0.7 0.8  PROT 8.0 6.9 7.5  ALBUMIN 4.2 3.7 3.8   Recent Labs  Lab 06/05/22 1247  LIPASE 25   No results for input(s): "AMMONIA" in the last 168 hours. Coagulation Profile: No results for input(s): "INR", "PROTIME" in the last 168 hours. Cardiac Enzymes: No results for input(s): "CKTOTAL", "CKMB", "CKMBINDEX", "TROPONINI" in the last 168 hours. BNP (last 3 results) No results for input(s): "PROBNP" in the last 8760 hours. HbA1C: No results for input(s): "HGBA1C" in the last 72 hours. CBG: No results for input(s): "GLUCAP" in the last 168 hours. Lipid Profile: No results for input(s): "CHOL", "HDL", "LDLCALC", "TRIG", "CHOLHDL", "LDLDIRECT" in the last 72 hours. Thyroid Function Tests: No results for input(s): "TSH", "T4TOTAL", "FREET4", "T3FREE", "THYROIDAB" in the last 72 hours. Anemia Panel: No results for input(s): "VITAMINB12", "FOLATE", "FERRITIN", "TIBC", "IRON", "RETICCTPCT" in the last 72 hours. Sepsis Labs: Recent Labs  Lab 06/06/22 0105 06/06/22 0930 06/06/22 1205 06/07/22 0245  LATICACIDVEN 2.9* 6.5* 2.3* 1.1    Recent Results (from the past 240 hour(s))  MRSA Next Gen by PCR, Nasal     Status: None   Collection Time: 06/07/22  1:02 AM   Specimen: Nasal Mucosa; Nasal Swab  Result Value Ref Range Status   MRSA by PCR Next Gen NOT DETECTED NOT DETECTED Final    Comment: (NOTE) The GeneXpert MRSA Assay (FDA approved for NASAL specimens only), is one component of a comprehensive MRSA colonization surveillance program. It is not intended to  diagnose MRSA infection nor to guide or monitor treatment for MRSA infections. Test performance is not FDA approved in patients less than 58 years old. Performed at [facility], [place] [address], [place], [place] [address]          Radiology Studies: CT Angio Chest/Abd/Pel for Dissection W and/or Wo Contrast  Result Date: 06/05/2022 CLINICAL DATA:  Chest and abdominal pain, initial encounter EXAM: CT ANGIOGRAPHY CHEST, ABDOMEN AND PELVIS TECHNIQUE: Non-contrast CT of the chest was initially obtained. Multidetector CT imaging through the chest, abdomen and pelvis was performed using the standard protocol during bolus administration of intravenous contrast. Multiplanar reconstructed images and MIPs were obtained and reviewed to evaluate the vascular anatomy. RADIATION DOSE REDUCTION: This exam was performed according to the departmental dose-optimization program which includes automated exposure control, adjustment of the mA and/or kV according to  patient size and/or use of iterative reconstruction technique. CONTRAST:  12m OMNIPAQUE IOHEXOL 350 MG/ML SOLN COMPARISON:  CT of the abdomen pelvis from 5 hours previous FINDINGS: CTA CHEST FINDINGS Cardiovascular: Thoracic aorta demonstrates atherosclerotic calcifications without aneurysmal dilatation or dissection. No cardiac enlargement is seen. The pulmonary artery as visualized shows no central pulmonary emboli. Mediastinum/Nodes: Thoracic inlet is within normal limits. No hilar or mediastinal adenopathy is noted. The esophagus as visualized is within normal limits. Lungs/Pleura: The lungs are well aerated bilaterally. No focal infiltrate or sizable effusion is seen. No sizable parenchymal nodule is noted. Musculoskeletal: Degenerative changes of the thoracic spine are seen. No acute rib abnormality is noted. Review of the MIP images confirms the above findings. CTA ABDOMEN AND PELVIS FINDINGS VASCULAR Aorta: Abdominal aorta is well  visualized similar to that seen on the recent CT examination. No significant atherosclerotic calcifications are noted. No aneurysmal dilatation or dissection is noted. Celiac: Patent without evidence of aneurysm, dissection, vasculitis or significant stenosis. SMA: Patent without evidence of aneurysm, dissection, vasculitis or significant stenosis. Renals: Both renal arteries are patent without evidence of aneurysm, dissection, vasculitis, fibromuscular dysplasia or significant stenosis. IMA: Patent without evidence of aneurysm, dissection, vasculitis or significant stenosis. Inflow: Iliacs are within normal limits. Mild atherosclerotic calcifications are seen. No focal stenosis or dissection is noted. Veins: None no specific venous abnormality is noted. Review of the MIP images confirms the above findings. NON-VASCULAR Hepatobiliary: Liver is within normal limits. Gallbladder is well distended with multiple dependent gallstones. No complicating factors are seen. Pancreas: Unremarkable. No pancreatic ductal dilatation or surrounding inflammatory changes. Spleen: Normal in size without focal abnormality. Adrenals/Urinary Tract: Adrenal glands are within normal limits. Kidneys demonstrate a normal enhancement pattern bilaterally. Normal excretion is noted bilaterally as well. No renal calculi are seen. The bladder is decompressed. Contrast enhanced urine is noted within. Stomach/Bowel: Colon shows no obstructive or inflammatory changes. The appendix is within normal limits. Small bowel and stomach are unremarkable. Lymphatic: No lymphadenopathy is seen. Reproductive: Lobular uterus is noted consistent with uterine fibroid change. The overall appearance is stable from 5 hours previous. Other: No abdominal wall hernia or abnormality. No abdominopelvic ascites. Musculoskeletal: Right hip arthroplasty. No acute bony abnormality is noted. Review of the MIP images confirms the above findings. IMPRESSION: CTA of the chest:  No evidence of aortic dissection or aneurysmal dilatation. No evidence of pulmonary emboli. No acute abnormality noted. CTA of the abdomen and pelvis: The overall appearance is similar to that seen 5 hours previous. The aorta is again unremarkable. Cholelithiasis without complicating factors. Uterine fibroid change. Electronically Signed   By: MInez CatalinaM.D.   On: 06/05/2022 21:45   CT ABDOMEN PELVIS W CONTRAST  Result Date: 06/05/2022 CLINICAL DATA:  Abdominal pain, acute nonlocalized. EXAM: CT ABDOMEN AND PELVIS WITH CONTRAST TECHNIQUE: Multidetector CT imaging of the abdomen and pelvis was performed using the standard protocol following bolus administration of intravenous contrast. RADIATION DOSE REDUCTION: This exam was performed according to the departmental dose-optimization program which includes automated exposure control, adjustment of the mA and/or kV according to patient size and/or use of iterative reconstruction technique. CONTRAST:  1034mOMNIPAQUE IOHEXOL 300 MG/ML  SOLN COMPARISON:  No recent relevant priors available for comparison at time of dictation. FINDINGS: Lower chest: No acute abnormality. Hepatobiliary: No acute osseous abnormality. Cholelithiasis without findings of acute cholecystitis. No biliary ductal dilation. Pancreas: No pancreatic ductal dilation or evidence of acute inflammation. Spleen: No splenomegaly. Adrenals/Urinary Tract: Bilateral adrenal glands appear normal.  No hydronephrosis. Kidneys demonstrate symmetric enhancement. Urinary bladder is unremarkable for degree of distension. Stomach/Bowel: Stomach is unremarkable for degree of distension. No pathologic dilation of small or large bowel. The appendix and terminal ileum appear normal. No evidence of acute bowel inflammation. Evaluation of loops of bowel in the pelvis is limited by streak artifact from right hip arthroplasty. Vascular/Lymphatic: Normal caliber abdominal aorta. No pathologically enlarged abdominal or  pelvic lymph nodes. Reproductive: Multiple masses in an enlarged lobular uterus measuring up to 4.1 cm commonly reflect uterine leiomyomas. No suspicious adnexal mass. Other: Trace pelvic free fluid. Musculoskeletal: Right total hip arthroplasty. Multilevel degenerative changes spine. No acute osseous abnormality. IMPRESSION: 1. No acute abnormality in the abdomen or pelvis. 2. Cholelithiasis without findings of acute cholecystitis. 3. Multiple masses in an enlarged lobular uterus measuring up to 4.1 cm commonly reflect uterine leiomyomas. Electronically Signed   By: Dahlia Bailiff M.D.   On: 06/05/2022 17:05        Scheduled Meds:  Chlorhexidine Gluconate Cloth  6 each Topical Daily   enoxaparin (LOVENOX) injection  40 mg Subcutaneous Q24H   metoprolol tartrate  12.5 mg Oral BID   pantoprazole (PROTONIX) IV  40 mg Intravenous Q24H   sodium chloride flush  3 mL Intravenous Q12H   Continuous Infusions:  sodium chloride 100 mL/hr at 06/07/22 0623   promethazine (PHENERGAN) injection (IM or IVPB) Stopped (06/07/22 0543)     LOS: 0 days   Time spent: 65 min  Georgette Shell, MD 06/07/2022, 9:50 AM

## 2022-06-08 ENCOUNTER — Inpatient Hospital Stay (HOSPITAL_COMMUNITY): Payer: Medicare HMO | Admitting: Certified Registered Nurse Anesthetist

## 2022-06-08 ENCOUNTER — Encounter (HOSPITAL_COMMUNITY)
Admission: EM | Disposition: A | Discharge status: Home / Self Care | Payer: Self-pay | Source: Home / Self Care | Attending: Internal Medicine

## 2022-06-08 ENCOUNTER — Encounter (HOSPITAL_COMMUNITY): Payer: Self-pay | Admitting: Internal Medicine

## 2022-06-08 DIAGNOSIS — M1990 Unspecified osteoarthritis, unspecified site: Secondary | ICD-10-CM | POA: Diagnosis not present

## 2022-06-08 DIAGNOSIS — R112 Nausea with vomiting, unspecified: Secondary | ICD-10-CM

## 2022-06-08 DIAGNOSIS — K2970 Gastritis, unspecified, without bleeding: Secondary | ICD-10-CM | POA: Diagnosis not present

## 2022-06-08 DIAGNOSIS — I160 Hypertensive urgency: Secondary | ICD-10-CM | POA: Diagnosis not present

## 2022-06-08 DIAGNOSIS — K2289 Other specified disease of esophagus: Secondary | ICD-10-CM | POA: Diagnosis not present

## 2022-06-08 DIAGNOSIS — K449 Diaphragmatic hernia without obstruction or gangrene: Secondary | ICD-10-CM | POA: Diagnosis not present

## 2022-06-08 DIAGNOSIS — E876 Hypokalemia: Secondary | ICD-10-CM | POA: Diagnosis present

## 2022-06-08 DIAGNOSIS — E872 Acidosis, unspecified: Secondary | ICD-10-CM | POA: Diagnosis present

## 2022-06-08 DIAGNOSIS — R109 Unspecified abdominal pain: Secondary | ICD-10-CM | POA: Diagnosis not present

## 2022-06-08 HISTORY — PX: ESOPHAGOGASTRODUODENOSCOPY (EGD) WITH PROPOFOL: SHX5813

## 2022-06-08 HISTORY — PX: BIOPSY: SHX5522

## 2022-06-08 LAB — CBC WITH DIFFERENTIAL/PLATELET
Abs Immature Granulocytes: 0.01 10*3/uL (ref 0.00–0.07)
Basophils Absolute: 0 10*3/uL (ref 0.0–0.1)
Basophils Relative: 0 %
Eosinophils Absolute: 0 10*3/uL (ref 0.0–0.5)
Eosinophils Relative: 0 %
HCT: 35.3 % — ABNORMAL LOW (ref 36.0–46.0)
Hemoglobin: 11.4 g/dL — ABNORMAL LOW (ref 12.0–15.0)
Immature Granulocytes: 0 %
Lymphocytes Relative: 16 %
Lymphs Abs: 1.1 10*3/uL (ref 0.7–4.0)
MCH: 30.2 pg (ref 26.0–34.0)
MCHC: 32.3 g/dL (ref 30.0–36.0)
MCV: 93.6 fL (ref 80.0–100.0)
Monocytes Absolute: 0.5 10*3/uL (ref 0.1–1.0)
Monocytes Relative: 8 %
Neutro Abs: 5.1 10*3/uL (ref 1.7–7.7)
Neutrophils Relative %: 76 %
Platelets: 236 10*3/uL (ref 150–400)
RBC: 3.77 MIL/uL — ABNORMAL LOW (ref 3.87–5.11)
RDW: 13.3 % (ref 11.5–15.5)
WBC: 6.7 10*3/uL (ref 4.0–10.5)
nRBC: 0 % (ref 0.0–0.2)

## 2022-06-08 LAB — COMPREHENSIVE METABOLIC PANEL
ALT: 14 U/L (ref 0–44)
AST: 20 U/L (ref 15–41)
Albumin: 3.3 g/dL — ABNORMAL LOW (ref 3.5–5.0)
Alkaline Phosphatase: 40 U/L (ref 38–126)
Anion gap: 7 (ref 5–15)
BUN: 11 mg/dL (ref 8–23)
CO2: 25 mmol/L (ref 22–32)
Calcium: 8.7 mg/dL — ABNORMAL LOW (ref 8.9–10.3)
Chloride: 103 mmol/L (ref 98–111)
Creatinine, Ser: 0.98 mg/dL (ref 0.44–1.00)
GFR, Estimated: >60 mL/min (ref >60)
Glucose, Bld: 96 mg/dL (ref 70–99)
Potassium: 4.5 mmol/L (ref 3.5–5.1)
Sodium: 135 mmol/L (ref 135–145)
Total Bilirubin: 0.8 mg/dL (ref 0.3–1.2)
Total Protein: 7 g/dL (ref 6.5–8.1)

## 2022-06-08 LAB — MAGNESIUM: Magnesium: 2.1 mg/dL (ref 1.7–2.4)

## 2022-06-08 LAB — CALCIUM, IONIZED: Calcium, Ionized, Serum: 5.1 mg/dL (ref 4.5–5.6)

## 2022-06-08 SURGERY — ESOPHAGOGASTRODUODENOSCOPY (EGD) WITH PROPOFOL
Anesthesia: Monitor Anesthesia Care

## 2022-06-08 MED ORDER — PROPOFOL 10 MG/ML IV BOLUS
INTRAVENOUS | Status: DC | PRN
  Administered 2022-06-08: 30 mg via INTRAVENOUS

## 2022-06-08 MED ORDER — PROPOFOL 1000 MG/100ML IV EMUL
INTRAVENOUS | Status: AC
  Filled 2022-06-08: qty 100

## 2022-06-08 MED ORDER — SUCRALFATE 1 G PO TABS
1.0000 g | ORAL_TABLET | Freq: Two times a day (BID) | ORAL | Status: DC
  Administered 2022-06-08 – 2022-06-10 (×5): 1 g via ORAL
  Filled 2022-06-08 (×6): qty 1

## 2022-06-08 MED ORDER — LACTATED RINGERS IV SOLN
INTRAVENOUS | Status: AC | PRN
  Administered 2022-06-08: 20 mL/h via INTRAVENOUS

## 2022-06-08 MED ORDER — PANTOPRAZOLE SODIUM 40 MG IV SOLR
40.0000 mg | Freq: Two times a day (BID) | INTRAVENOUS | Status: DC
  Administered 2022-06-08 – 2022-06-09 (×3): 40 mg via INTRAVENOUS
  Filled 2022-06-08 (×3): qty 10

## 2022-06-08 MED ORDER — PROPOFOL 500 MG/50ML IV EMUL
INTRAVENOUS | Status: DC | PRN
  Administered 2022-06-08: 150 ug/kg/min via INTRAVENOUS

## 2022-06-08 SURGICAL SUPPLY — 15 items

## 2022-06-08 NOTE — Anesthesia Postprocedure Evaluation (Signed)
Anesthesia Post Note  Patient: Christy Kirby  Procedure(s) Performed: ESOPHAGOGASTRODUODENOSCOPY (EGD) WITH PROPOFOL BIOPSY     Patient location during evaluation: Endoscopy Anesthesia Type: MAC Level of consciousness: awake Pain management: pain level controlled Vital Signs Assessment: post-procedure vital signs reviewed and stable Respiratory status: spontaneous breathing Cardiovascular status: stable Postop Assessment: no apparent nausea or vomiting Anesthetic complications: no  No notable events documented.  Last Vitals:  Vitals:   06/08/22 1240 06/08/22 1250  BP: 128/82 (!) 146/69  Pulse: 85 76  Resp: 17 15  Temp:    SpO2: 98% 100%    Last Pain:  Vitals:   06/08/22 1250  TempSrc:   PainSc: 0-No pain   Pain Goal: Patients Stated Pain Goal: 2 (06/07/22 2126)                 Huston Foley

## 2022-06-08 NOTE — Op Note (Addendum)
[facility] Patient Name: Christy Kirby Procedure Date: 06/08/2022 MRN: [id number] Attending MD: Justice Britain , MD, [id number] Date of Birth: Oct 11, 1950 CSN: [id number] Age: 72 Admit Type: Inpatient Procedure:                Upper GI endoscopy Indications:              Generalized abdominal pain, Abnormal CT of the GI                            tract, Nausea with vomiting Providers:                Justice Britain, MD, Dulcy Fanny, Darliss Cheney, Technician, Dellie Catholic Referring MD:             Docia Chuck. Henrene Pastor, MD, Inpatient Medical Service Medicines:                Monitored Anesthesia Care Complications:            No immediate complications. Estimated Blood Loss:     Estimated blood loss was minimal. Procedure:                Pre-Anesthesia Assessment:                           - Prior to the procedure, a History and Physical                            was performed, and patient medications and                            allergies were reviewed. The patient's tolerance of                            previous anesthesia was also reviewed. The risks                            and benefits of the procedure and the sedation                            options and risks were discussed with the patient.                            All questions were answered, and informed consent                            was obtained. Prior Anticoagulants: The patient has                            taken no anticoagulant or antiplatelet agents                            except for NSAID medication. ASA Grade Assessment:  II - A patient with mild systemic disease. After                            reviewing the risks and benefits, the patient was                            deemed in satisfactory condition to undergo the                            procedure.                           After obtaining informed consent, the endoscope was                             passed under direct vision. Throughout the                            procedure, the patient's blood pressure, pulse, and                            oxygen saturations were monitored continuously. The                            [id number]-H190 ([id number] Olympus endoscope was introduced                            through the mouth, and advanced to the second part                            of duodenum. The upper GI endoscopy was                            accomplished without difficulty. The patient                            tolerated the procedure. Scope In: Scope Out: Findings:      No gross lesions were noted in the entire esophagus.      The Z-line was irregular and was found 36 cm from the incisors.      A 1 cm hiatal hernia was present.      Patchy mild inflammation characterized by erosions and erythema was       found in the gastric body and in the gastric antrum.      No other gross lesions were noted in the entire examined stomach.       Biopsies were taken with a cold forceps for histology and Helicobacter       pylori testing.      No gross lesions were noted in the duodenal bulb, in the first portion       of the duodenum and in the second portion of the duodenum. Biopsies were       taken with a cold forceps for histology. Impression:               - No gross lesions in the esophagus. Z-line  irregular, 36 cm from the incisors.                           - 1 cm hiatal hernia.                           - Gastritis. No other gross lesions in the entire                            stomach. Biopsied.                           - No gross lesions in the duodenal bulb, in the                            first portion of the duodenum and in the second                            portion of the duodenum. Biopsied. Moderate Sedation:      Not Applicable - Patient had care per Anesthesia. Recommendation:           - The patient will be  observed post-procedure,                            until all discharge criteria are met.                           - Discharge patient to home.                           - Patient has a contact number available for                            emergencies. The signs and symptoms of potential                            delayed complications were discussed with the                            patient. Return to normal activities tomorrow.                            Written discharge instructions were provided to the                            patient.                           - Advance diet as tolerated.                           - Increase PPI to 40 mg twice daily for 39-monthand                            then continue once daily (kept on  IV for now, but                            could transition to PO PPI as soon as able).                           - Trial Carafate twice daily for 2-weeks.                           - Await pathology results.                           - Observe patient's clinical course.                           - Pending patient's overall status, hopefully she                            will get through what is most likely an infectious                            gastroenteritis. If so, then when she is seen in                            follow up, she will likely need repeat imaging with                            CT-Enterography (can be pursued by primary GI or                            General Surgery) in 4-6 weeks. If she does not have                            improvement, will need to consider updated imaging                            sooner and whether IR could possibly access the                            area in question.                           - GI appreciates surgery evaluation/consultation                            that has been pursued.                           - The findings and recommendations were discussed                            with the  patient.                           - The  findings and recommendations were discussed                            with the referring physician. Procedure Code(s):        --- Professional ---                           [phone], Esophagogastroduodenoscopy, flexible,                            transoral; with biopsy, single or multiple Diagnosis Code(s):        --- Professional ---                           K22.89, Other specified disease of esophagus                           K44.9, Diaphragmatic hernia without obstruction or                            gangrene                           K29.70, Gastritis, unspecified, without bleeding                           R10.84, Generalized abdominal pain                           R11.2, Nausea with vomiting, unspecified                           R93.3, Abnormal findings on diagnostic imaging of                            other parts of digestive tract CPT copyright 2022 [employer]. All rights reserved. The codes documented in this report are preliminary and upon coder review may  be revised to meet current compliance requirements. Justice Britain, MD 06/08/2022 12:52:32 PM Number of Addenda: 0

## 2022-06-08 NOTE — Anesthesia Preprocedure Evaluation (Signed)
Anesthesia Evaluation  Patient identified by MRN, date of birth, ID band Patient awake    Reviewed: Allergy & Precautions, NPO status , Patient's Chart, lab work & pertinent test results  Airway Mallampati: I       Dental no notable dental hx. (+) Dental Advisory Given   Pulmonary former smoker   Pulmonary exam normal        Cardiovascular Exercise Tolerance: Good Normal cardiovascular exam     Neuro/Psych negative neurological ROS  negative psych ROS   GI/Hepatic Neg liver ROS,,,  Endo/Other  negative endocrine ROS    Renal/GU negative Renal ROS  negative genitourinary   Musculoskeletal  (+) Arthritis , Osteoarthritis,  Degenerative joint disease right hip   Abdominal Normal abdominal exam  (+)   Peds  Hematology negative hematology ROS (+) Plt 265k   Anesthesia Other Findings Day of surgery medications reviewed with the patient.  Reproductive/Obstetrics                             Anesthesia Physical Anesthesia Plan  ASA: II  Anesthesia Plan: MAC   Post-op Pain Management:    Induction:   PONV Risk Score and Plan: 2 and Propofol infusion, TIVA and Treatment may vary due to age or medical condition  Airway Management Planned: Natural Airway and Mask  Additional Equipment: None  Intra-op Plan:   Post-operative Plan:   Informed Consent: I have reviewed the patients History and Physical, chart, labs and discussed the procedure including the risks, benefits and alternatives for the proposed anesthesia with the patient or authorized representative who has indicated his/her understanding and acceptance.     Dental advisory given  Plan Discussed with: CRNA  Anesthesia Plan Comments:         Anesthesia Quick Evaluation

## 2022-06-08 NOTE — Interval H&P Note (Signed)
History and Physical Interval Note:  06/08/2022 12:12 PM  Christy Kirby  has presented today for surgery, with the diagnosis of nausea, vomiting, post prandial AB pain..  The various methods of treatment have been discussed with the patient and family. After consideration of risks, benefits and other options for treatment, the patient has consented to  Procedure(s): ESOPHAGOGASTRODUODENOSCOPY (EGD) WITH PROPOFOL (N/A) as a surgical intervention.  The patient's history has been reviewed, patient examined, no change in status, stable for surgery.  I have reviewed the patient's chart and labs.  Questions were answered to the patient's satisfaction.     [employer]

## 2022-06-08 NOTE — TOC Initial Note (Signed)
Transition of Care [facility]) - Initial/Assessment Note    Patient Details  Name: Christy Kirby MRN: [id number] Date of Birth: [birth year]  Transition of Care [facility]) CM/SW Contact:    Roseanne Kaufman, RN Phone Number: 06/08/2022, 5:44 PM  Clinical Narrative:    This RNCM spoke with patient who reports she has a walker and a cane at home. Transportation at discharge: daughter or sister  No current TOC needs at this time, will continue to follow.                Expected Discharge Plan: Home/Self Care Barriers to Discharge: Continued Medical Work up   Patient Goals and CMS Choice Patient states their goals for this hospitalization and ongoing recovery are:: n/v flank and back pain   Choice offered to / list presented to : NA      Expected Discharge Plan and Services In-house Referral: NA Discharge Planning Services: CM Consult Post Acute Care Choice: NA Living arrangements for the past 2 months: Single Family Home                 DME Arranged: N/A DME Agency: NA       HH Arranged: NA HH Agency: NA        Prior Living Arrangements/Services Living arrangements for the past 2 months: Single Family Home Lives with:: Self Patient language and need for interpreter reviewed:: Yes Do you feel safe going back to the place where you live?: Yes      Need for Family Participation in Patient Care: No (Comment) Care giver support system in place?: Yes (comment) Current home services: DME (DME: walker and cane) Criminal Activity/Legal Involvement Pertinent to Current Situation/Hospitalization: No - Comment as needed  Activities of Daily Living      Permission Sought/Granted Permission sought to share information with : Case Manager Permission granted to share information with : Yes, Verbal Permission Granted  Share Information with NAME: Case Manager           Emotional Assessment Appearance:: Appears younger than stated age Attitude/Demeanor/Rapport: Engaged,  Gracious Affect (typically observed): Accepting Orientation: : Oriented to Self, Oriented to Place, Oriented to  Time, Oriented to Situation Alcohol / Substance Use: Not Applicable Psych Involvement: No (comment)  Admission diagnosis:  Lactic acidosis [E87.20] Gastroenteritis [K52.9] Hypertensive urgency [I16.0] Calculus of gallbladder without cholecystitis without obstruction [K80.20] Abdominal pain with vomiting [R10.9, R11.10] Intractable nausea and vomiting [R11.2] Patient Active Problem List   Diagnosis Date Noted   Lactic acidosis 06/08/2022   Hypokalemia 06/08/2022   Hypomagnesemia 06/08/2022   Intractable nausea and vomiting 06/07/2022   Abnormal CT scan, gastrointestinal tract 06/07/2022   Hypertensive urgency 06/06/2022   Elevated lactic acid level 06/06/2022   Abdominal pain with vomiting 06/05/2022   HTN (hypertension) 09/30/2020   Osteoarthritis of right hip 01/11/2018   Hyperlipemia, mixed 02/08/2017   HIP PAIN, BILATERAL 04/07/2009   BACK PAIN, LUMBAR 04/07/2009   GERD 03/19/2007   PCP:  Laurey Morale, MD Pharmacy:   [id number] [id number]: [phone]: [phone] [id number] [address] [id number]: [phone]: [phone]   Social Determinants of Health (SDOH) Social History: SDGoldenNo Food Insecurity (04/05/2022)  Housing: Low Risk  (04/05/2022)  Transportation Needs: No Transportation Needs (03/09/2021)  Utilities: Not At  Risk (04/05/2022)  Alcohol Screen: Low Risk  (04/05/2022)  Depression (PHQ2-9): Low Risk  (04/05/2022)  Financial Resource Strain: Low Risk  (04/05/2022)  Physical Activity: Inactive (04/05/2022)  Social Connections: Moderately Integrated (04/05/2022)  Stress: No Stress Concern Present (04/05/2022)   Tobacco Use: Medium Risk (06/08/2022)   SDOH Interventions:     Readmission Risk Interventions     No data to display

## 2022-06-08 NOTE — Transfer of Care (Signed)
Immediate Anesthesia Transfer of Care Note  Patient: Christy Kirby  Procedure(s) Performed: ESOPHAGOGASTRODUODENOSCOPY (EGD) WITH PROPOFOL BIOPSY  Patient Location: Endoscopy Unit  Anesthesia Type:MAC  Level of Consciousness: drowsy  Airway & Oxygen Therapy: Patient Spontanous Breathing and Patient connected to face mask  Post-op Assessment: Report given to RN and Post -op Vital signs reviewed and stable  Post vital signs: Reviewed and stable  Last Vitals:  Vitals Value Taken Time  BP    Temp    Pulse    Resp    SpO2      Last Pain:  Vitals:   06/08/22 1133  TempSrc: Temporal  PainSc: 5       Patients Stated Pain Goal: 2 (51/88/41 6606)  Complications: No notable events documented.

## 2022-06-08 NOTE — Progress Notes (Signed)
PROGRESS NOTE    Christy Kirby  UDJ:[id number] DOB: Jan 19, 1951 DOA: 06/05/2022 PCP: Laurey Morale, MD    Chief Complaint  Patient presents with   Emesis    Brief Narrative:   72 year old female with no significant past medical history admitted with abdominal pain and middle back pain with nausea and vomiting. She had no urinary symptoms.   CT angiogram of the chest abdomen and pelvis did not show any acute findings. Her lactic acid level was elevated.  She was treated symptomatically with IV fluids and Zofran  Her electrolytes were low with low potassium magnesium and calcium these were all replaced. Patient ate a regular diet last night since she did not have any vomiting on 06/06/2022,.  But she vomited the dinner again the evening of 06/06/2022 with complaints of epigastric pain.   -Repeat CT abdomen and pelvis done with stranding in the ileum mesentery surrounding a low-density area in hindsight as seen on prior study measuring 19 x 17 mm, suggestion of some peripheral and nodular enhancement.  Findings nonspecific and of uncertain significance, differential diagnosis include Meckel's diverticulitis or cystic/necrotic lymph node or nodal metastases in the ileal mesentery, given nodular enhancement at the periphery underlying neoplasm will be difficult to exclude: Interloop abscess is felt less likely but given stranding not entirely excluded.  Would also correlate with any signs or history of GI bleed.  Surgical consultation may be helpful.  GI and general surgery consulted.  Patient for upper endoscopy 06/08/2022.     Assessment & Plan:   Principal Problem:   Abdominal pain with vomiting Active Problems:   Hypertensive urgency   Elevated lactic acid level   Intractable nausea and vomiting   Abnormal CT scan, gastrointestinal tract   #1 nausea/vomiting/epigastric abdominal pain -Initial concern for possible gastritis versus gastroenteritis, patient with no history of peptic ulcer  disease. -Initial CT scan done on admission unremarkable. -Repeat CT scan done 06/07/2022 with stranding in the ileum mesentery surrounding a low-density area in hindsight as seen on prior study measuring 19 x 17 mm, suggestion of some peripheral and nodular enhancement.  Findings nonspecific and of uncertain significance, differential diagnosis include Meckel's diverticulitis or cystic/necrotic lymph node or nodal metastases in the ileal mesentery, given nodular enhancement at the periphery underlying neoplasm will be difficult to exclude: Interloop abscess is felt less likely but given stranding not entirely excluded.  Would also correlate with any signs or history of GI bleed.  Surgical consultation may be helpful.  GI and general surgery consulted.  Patient for upper endoscopy 06/08/2022. -Patient with some clinical improvement today however still with upper abdominal pain. Continue supportive care. -GI and general surgery following.  2.  Hypokalemia/hypomagnesemia -Likely secondary to GI losses. -Repleted, magnesium at 2.1, potassium of 4.5.  3.  Hypocalcemia -Status post IV calcium gluconate with improvement.  4.  Hyperchloremia/acidosis -Secondary to nausea and emesis. -Improved with hydration.  5.  Lactic acidosis -Resolved with hydration. -No evidence of infection at this time. -No need for antibiotics at this time.  6.  Hypertensive urgency on admission -Noted on admission.  Patient noted to have received multiple doses of as needed IV labetalol. -Continue current regimen of Lopressor.   DVT prophylaxis: SCDs Code Status: Full Family Communication: Updated patient.  No family at bedside. Disposition: Likely home when clinically improved.  Status is: Inpatient Remains inpatient appropriate because: Severity of illness   Consultants:  General surgery: Dr. Johney Maine 06/07/2022 Gastroenterology: Dr. Rush Landmark 06/07/2022  Procedures:  CT  abdomen and pelvis 06/05/2022, 06/07/2022 CT  angiogram chest abdomen and pelvis 06/05/2022  Antimicrobials:  None   Subjective: Patient sitting up in bed. No further emesis or nausea. Still with upper abd pain which she feels to day maybe from emesis. No CP. No sob. fOR egd TODAY.   Objective: Vitals:   06/08/22 0000 06/08/22 0300 06/08/22 0400 06/08/22 0500  BP: 110/68  128/79   Pulse: 79 83 78 83  Resp: '13 13 15 13  '$ Temp:  99 F (37.2 C)    TempSrc:  Oral    SpO2: 97% 100% 99% 100%  Weight:      Height:        Intake/Output Summary (Last 24 hours) at 06/08/2022 1007 Last data filed at 06/08/2022 0731 Gross per 24 hour  Intake 1935.45 ml  Output --  Net 1935.45 ml   Filed Weights   06/07/22 0000 06/07/22 0449  Weight: 61.5 kg 61.5 kg    Examination:  General exam: Appears calm and comfortable  Respiratory system: Clear to auscultation.  No wheezes, no crackles, no rhonchi.  Fair air movement.  Speaking in full sentences.  Respiratory effort normal. Cardiovascular system: S1 & S2 heard, RRR. No JVD, murmurs, rubs, gallops or clicks. No pedal edema. Gastrointestinal system: Abdomen is nondistended, soft and ttp in the right upper quadrant, epigastric/left upper quadrant.  Positive bowel sounds.  No rebound.  No guarding.  Central nervous system: Alert and oriented. No focal neurological deficits. Extremities: Symmetric 5 x 5 power. Skin: No rashes, lesions or ulcers Psychiatry: Judgement and insight appear normal. Mood & affect appropriate.     Data Reviewed: I have personally reviewed following labs and imaging studies  CBC: Recent Labs  Lab 06/05/22 1247 06/06/22 0523 06/08/22 0801  WBC 7.1 9.6 6.7  NEUTROABS 5.9  --  5.1  HGB 12.9 12.5 11.4*  HCT 39.0 37.5 35.3*  MCV 91.1 91.0 93.6  PLT 271 260 128    Basic Metabolic Panel: Recent Labs  Lab 06/05/22 1247 06/06/22 0523 06/06/22 1508 06/06/22 2315 06/07/22 0334 06/08/22 0801  NA 138 132* 143 137  --  135  K 3.6 3.3* <2.0* 4.9  --  4.5  CL  104 98 126* 105  --  103  CO2 23 24 15* 25  --  25  GLUCOSE 121* 128* 59* 98  --  96  BUN 11 9 5* 10  --  11  CREATININE 0.79 0.77 0.34* 0.77  --  0.98  CALCIUM 9.5 8.7* 4.0* 8.7*  --  8.7*  MG  --  1.4* 1.1*  --  2.9* 2.1    GFR: Estimated Creatinine Clearance: 45.1 mL/min (by C-G formula based on SCr of 0.98 mg/dL).  Liver Function Tests: Recent Labs  Lab 06/05/22 1247 06/06/22 0523 06/06/22 2315 06/08/22 0801  AST 21 30 33 20  ALT '14 13 14 14  '$ ALKPHOS 52 45 49 40  BILITOT 0.7 0.7 0.8 0.8  PROT 8.0 6.9 7.5 7.0  ALBUMIN 4.2 3.7 3.8 3.3*    CBG: No results for input(s): "GLUCAP" in the last 168 hours.   Recent Results (from the past 240 hour(s))  MRSA Next Gen by PCR, Nasal     Status: None   Collection Time: 06/07/22  1:02 AM   Specimen: Nasal Mucosa; Nasal Swab  Result Value Ref Range Status   MRSA by PCR Next Gen NOT DETECTED NOT DETECTED Final    Comment: (NOTE) The GeneXpert MRSA Assay (FDA approved  for NASAL specimens only), is one component of a comprehensive MRSA colonization surveillance program. It is not intended to diagnose MRSA infection nor to guide or monitor treatment for MRSA infections. Test performance is not FDA approved in patients less than 55 years old. Performed at [facility], [place] [address] [place],  [address]          Radiology Studies: CT ABDOMEN PELVIS W CONTRAST  Result Date: 06/07/2022 CLINICAL DATA:  Acute nonlocalized abdominal pain in a 72 year old female. EXAM: CT ABDOMEN AND PELVIS WITH CONTRAST TECHNIQUE: Multidetector CT imaging of the abdomen and pelvis was performed using the standard protocol following bolus administration of intravenous contrast. RADIATION DOSE REDUCTION: This exam was performed according to the departmental dose-optimization program which includes automated exposure control, adjustment of the mA and/or kV according to patient size and/or use of iterative reconstruction  technique. CONTRAST:  146m OMNIPAQUE IOHEXOL 300 MG/ML  SOLN COMPARISON:  June 05, 2022 FINDINGS: Lower chest: Lung bases with basilar atelectasis. No sign of pleural effusion or consolidative process. Hepatobiliary: Vicarious excretion of contrast material into the gallbladder. Cholelithiasis. No pericholecystic stranding. No biliary duct dilation. No focal, suspicious hepatic lesion. The portal vein is patent. Pancreas: Normal, without mass, inflammation or ductal dilatation. Spleen: Normal. Adrenals/Urinary Tract: Adrenal glands are normal. Symmetric renal enhancement without signs of hydronephrosis. Urinary bladder is largely obscured by streak artifact from RIGHT hip arthroplasty. No suspicious renal lesion. No substantial perinephric stranding. Stomach/Bowel: Stomach without signs of adjacent stranding. No sign of small bowel obstruction. Stranding in the ileal mesentery surrounding a low-density area. This low-density area measures 19 x 17 mm. There are surrounding small enhancing lymph nodes. There is no small bowel pneumatosis. There is no definitive communication of this area with the small bowel. The appendix is normal. The above area in hindsight as seen on the prior study. There is suggestion of some peripheral and nodular enhancement on coronal images. No signs of acute colonic findings. Vascular/Lymphatic: Aortic atherosclerosis. No sign of aneurysm. Smooth contour of the IVC. There is no gastrohepatic or hepatoduodenal ligament lymphadenopathy. No retroperitoneal or mesenteric lymphadenopathy. No pelvic sidewall lymphadenopathy. Reproductive: Uterus with multiple leiomyomata, not well assessed on CT due to streak artifact. Other: Trace free fluid in the pelvis, of uncertain significance. Musculoskeletal: Spinal degenerative changes without acute or destructive bone process. IMPRESSION: 1. Stranding in the ileal mesentery surrounding a low-density area in hindsight as seen on the prior study.  This low-density area measures 19 x 17 mm. There is suggestion of some peripheral and nodular enhancement on coronal images. Findings are nonspecific and are of uncertain significance. Differential diagnosis would include Meckel's diverticulitis or cystic/necrotic lymph node or nodal metastasis in the ileal mesentery. Given nodular enhancement at the periphery underlying neoplasm would be difficult to exclude. Interloop abscess is felt less likely but given stranding not entirely excluded. Would also correlate with any signs of history of GI bleeding. Surgical consultation may be helpful. 2. No signs of pneumatosis or other secondary signs of bowel compromise. 3. Cholelithiasis without evidence of acute cholecystitis. 4. Trace free fluid in the pelvis, of uncertain significance. 5. Uterus with multiple leiomyomata, not well assessed on CT due to streak artifact from RIGHT hip arthroplasty. 6. Aortic atherosclerosis. Aortic Atherosclerosis (ICD10-I70.0). Electronically Signed   By: GZetta BillsM.D.   On: 06/07/2022 11:47        Scheduled Meds:  Chlorhexidine Gluconate Cloth  6 each Topical Daily   metoprolol tartrate  12.5 mg  Oral BID   pantoprazole (PROTONIX) IV  40 mg Intravenous Q24H   sodium chloride flush  3 mL Intravenous Q12H   Continuous Infusions:  sodium chloride 100 mL/hr at 06/08/22 0731   sodium chloride     promethazine (PHENERGAN) injection (IM or IVPB) Stopped (06/07/22 0543)     LOS: 1 day    Time spent: 45 minutes    Irine Seal, MD [facility]   To contact the attending provider between 7A-7P or the covering provider during after hours 7P-7A, please log into the web site [email or web address] and access using universal  password for that web site. If you do not have the password, please call the hospital operator.  06/08/2022, 10:07 AM

## 2022-06-08 NOTE — Anesthesia Procedure Notes (Signed)
Procedure Name: MAC Date/Time: 06/08/2022 12:13 PM  Performed by: Claudia Desanctis, CRNAPre-anesthesia Checklist: Patient identified, Emergency Drugs available, Suction available and Patient being monitored Patient Re-evaluated:Patient Re-evaluated prior to induction Oxygen Delivery Method: Simple face mask

## 2022-06-08 NOTE — Progress Notes (Signed)
Progress Note  Day of Surgery  Subjective: Pt reports abdominal pain is improving. No further nausea or vomiting. Plan noted for EGD today.   Objective: Vital signs in last 24 hours: Temp:  [97.8 F (36.6 C)-100.1 F (37.8 C)] 99 F (37.2 C) (01/03 0300) Pulse Rate:  [74-86] 83 (01/03 0500) Resp:  [10-17] 13 (01/03 0500) BP: (99-161)/(51-92) 128/79 (01/03 0400) SpO2:  [97 %-100 %] 100 % (01/03 0500) Last BM Date : 06/05/22  Intake/Output from previous day: 01/02 0701 - 01/03 0700 In: 1610.1 [P.O.:780; I.V.:830.1] Out: -  Intake/Output this shift: Total I/O In: 805.4 [I.V.:805.4] Out: -   PE: General: pleasant, WD, WN female who is laying in bed in NAD Heart: regular, rate, and rhythm.   Lungs: Respiratory effort nonlabored Abd: soft, NT, ND, +BS, no masses, hernias, or organomegaly Psych: A&Ox3 with an appropriate affect.    Lab Results:  Recent Labs    06/06/22 0523 06/08/22 0801  WBC 9.6 6.7  HGB 12.5 11.4*  HCT 37.5 35.3*  PLT 260 236   BMET Recent Labs    06/06/22 2315 06/08/22 0801  NA 137 135  K 4.9 4.5  CL 105 103  CO2 25 25  GLUCOSE 98 96  BUN 10 11  CREATININE 0.77 0.98  CALCIUM 8.7* 8.7*   PT/INR No results for input(s): "LABPROT", "INR" in the last 72 hours. CMP     Component Value Date/Time   NA 135 06/08/2022 0801   K 4.5 06/08/2022 0801   CL 103 06/08/2022 0801   CO2 25 06/08/2022 0801   GLUCOSE 96 06/08/2022 0801   BUN 11 06/08/2022 0801   CREATININE 0.98 06/08/2022 0801   CALCIUM 8.7 (L) 06/08/2022 0801   PROT 7.0 06/08/2022 0801   ALBUMIN 3.3 (L) 06/08/2022 0801   AST 20 06/08/2022 0801   ALT 14 06/08/2022 0801   ALKPHOS 40 06/08/2022 0801   BILITOT 0.8 06/08/2022 0801   GFRNONAA >60 06/08/2022 0801   GFRAA >60 01/12/2018 0516   Lipase     Component Value Date/Time   LIPASE 25 06/05/2022 1247       Studies/Results: CT ABDOMEN PELVIS W CONTRAST  Result Date: 06/07/2022 CLINICAL DATA:  Acute nonlocalized  abdominal pain in a 72 year old female. EXAM: CT ABDOMEN AND PELVIS WITH CONTRAST TECHNIQUE: Multidetector CT imaging of the abdomen and pelvis was performed using the standard protocol following bolus administration of intravenous contrast. RADIATION DOSE REDUCTION: This exam was performed according to the departmental dose-optimization program which includes automated exposure control, adjustment of the mA and/or kV according to patient size and/or use of iterative reconstruction technique. CONTRAST:  171m OMNIPAQUE IOHEXOL 300 MG/ML  SOLN COMPARISON:  June 05, 2022 FINDINGS: Lower chest: Lung bases with basilar atelectasis. No sign of pleural effusion or consolidative process. Hepatobiliary: Vicarious excretion of contrast material into the gallbladder. Cholelithiasis. No pericholecystic stranding. No biliary duct dilation. No focal, suspicious hepatic lesion. The portal vein is patent. Pancreas: Normal, without mass, inflammation or ductal dilatation. Spleen: Normal. Adrenals/Urinary Tract: Adrenal glands are normal. Symmetric renal enhancement without signs of hydronephrosis. Urinary bladder is largely obscured by streak artifact from RIGHT hip arthroplasty. No suspicious renal lesion. No substantial perinephric stranding. Stomach/Bowel: Stomach without signs of adjacent stranding. No sign of small bowel obstruction. Stranding in the ileal mesentery surrounding a low-density area. This low-density area measures 19 x 17 mm. There are surrounding small enhancing lymph nodes. There is no small bowel pneumatosis. There is no definitive communication of  this area with the small bowel. The appendix is normal. The above area in hindsight as seen on the prior study. There is suggestion of some peripheral and nodular enhancement on coronal images. No signs of acute colonic findings. Vascular/Lymphatic: Aortic atherosclerosis. No sign of aneurysm. Smooth contour of the IVC. There is no gastrohepatic or  hepatoduodenal ligament lymphadenopathy. No retroperitoneal or mesenteric lymphadenopathy. No pelvic sidewall lymphadenopathy. Reproductive: Uterus with multiple leiomyomata, not well assessed on CT due to streak artifact. Other: Trace free fluid in the pelvis, of uncertain significance. Musculoskeletal: Spinal degenerative changes without acute or destructive bone process. IMPRESSION: 1. Stranding in the ileal mesentery surrounding a low-density area in hindsight as seen on the prior study. This low-density area measures 19 x 17 mm. There is suggestion of some peripheral and nodular enhancement on coronal images. Findings are nonspecific and are of uncertain significance. Differential diagnosis would include Meckel's diverticulitis or cystic/necrotic lymph node or nodal metastasis in the ileal mesentery. Given nodular enhancement at the periphery underlying neoplasm would be difficult to exclude. Interloop abscess is felt less likely but given stranding not entirely excluded. Would also correlate with any signs of history of GI bleeding. Surgical consultation may be helpful. 2. No signs of pneumatosis or other secondary signs of bowel compromise. 3. Cholelithiasis without evidence of acute cholecystitis. 4. Trace free fluid in the pelvis, of uncertain significance. 5. Uterus with multiple leiomyomata, not well assessed on CT due to streak artifact from RIGHT hip arthroplasty. 6. Aortic atherosclerosis. Aortic Atherosclerosis (ICD10-I70.0). Electronically Signed   By: Zetta Bills M.D.   On: 06/07/2022 11:47    Anti-infectives: Anti-infectives (From admission, onward)    None        Assessment/Plan  Abdominal pain, nausea, vomiting - Patient with acute onset abdominal pain, nausea, and vomiting without history of similar symptoms previously. General surgery asked to see given abnormal CT scan report: stranding in the ileal mesentery surrounding a low-density area in hindsight as seen on the prior  study measuring 19 x 17 mm; there is suggestion of some peripheral and nodular enhancement; findings are nonspecific and of uncertain significance; differential diagnosis would include Meckel's diverticulitis or cystic/necrotic lymph node or nodal metastasis in the ileal mesentery; given nodular enhancement at the periphery underlying neoplasm would be difficult to exclude; interloop abscess is felt less likely but given stranding not entirely excluded; would also correlate with any signs of history of GI bleeding; surgical consultation may be helpful; no signs of pneumatosis or other secondary signs of bowel compromise - tolerating liquids and pain seems to be improving - GI consulted as well and planning EGD today  - If patient improves and is able to tolerate a diet and go home, could consider repeat imaging (CT enterography) in about 3 months for further evaluation - no indication for surgical intervention at this time, will follow peripherally    ID - none VTE - lovenox  FEN - NPO for EGD Foley - none   Hypertensive urgency   LOS: 1 day     Norm Parcel, [facility] 06/08/2022, 10:16 AM Please see Amion for pager number during day hours 7:00am-4:30pm

## 2022-06-09 DIAGNOSIS — R109 Unspecified abdominal pain: Secondary | ICD-10-CM | POA: Diagnosis not present

## 2022-06-09 DIAGNOSIS — R112 Nausea with vomiting, unspecified: Secondary | ICD-10-CM | POA: Diagnosis not present

## 2022-06-09 DIAGNOSIS — K529 Noninfective gastroenteritis and colitis, unspecified: Secondary | ICD-10-CM | POA: Diagnosis not present

## 2022-06-09 DIAGNOSIS — E872 Acidosis, unspecified: Secondary | ICD-10-CM | POA: Diagnosis not present

## 2022-06-09 DIAGNOSIS — R935 Abnormal findings on diagnostic imaging of other abdominal regions, including retroperitoneum: Secondary | ICD-10-CM | POA: Diagnosis not present

## 2022-06-09 DIAGNOSIS — K2900 Acute gastritis without bleeding: Secondary | ICD-10-CM | POA: Diagnosis present

## 2022-06-09 DIAGNOSIS — I160 Hypertensive urgency: Secondary | ICD-10-CM | POA: Diagnosis not present

## 2022-06-09 LAB — BASIC METABOLIC PANEL
Anion gap: 7 (ref 5–15)
BUN: 10 mg/dL (ref 8–23)
CO2: 27 mmol/L (ref 22–32)
Calcium: 9 mg/dL (ref 8.9–10.3)
Chloride: 103 mmol/L (ref 98–111)
Creatinine, Ser: 0.96 mg/dL (ref 0.44–1.00)
GFR, Estimated: >60 mL/min (ref >60)
Glucose, Bld: 100 mg/dL — ABNORMAL HIGH (ref 70–99)
Potassium: 4.4 mmol/L (ref 3.5–5.1)
Sodium: 137 mmol/L (ref 135–145)

## 2022-06-09 LAB — CBC WITH DIFFERENTIAL/PLATELET
Abs Immature Granulocytes: 0.01 10*3/uL (ref 0.00–0.07)
Basophils Absolute: 0 10*3/uL (ref 0.0–0.1)
Basophils Relative: 1 %
Eosinophils Absolute: 0 10*3/uL (ref 0.0–0.5)
Eosinophils Relative: 1 %
HCT: 35.8 % — ABNORMAL LOW (ref 36.0–46.0)
Hemoglobin: 11.4 g/dL — ABNORMAL LOW (ref 12.0–15.0)
Immature Granulocytes: 0 %
Lymphocytes Relative: 23 %
Lymphs Abs: 1.4 10*3/uL (ref 0.7–4.0)
MCH: 30.1 pg (ref 26.0–34.0)
MCHC: 31.8 g/dL (ref 30.0–36.0)
MCV: 94.5 fL (ref 80.0–100.0)
Monocytes Absolute: 0.5 10*3/uL (ref 0.1–1.0)
Monocytes Relative: 9 %
Neutro Abs: 4.2 10*3/uL (ref 1.7–7.7)
Neutrophils Relative %: 66 %
Platelets: 235 10*3/uL (ref 150–400)
RBC: 3.79 MIL/uL — ABNORMAL LOW (ref 3.87–5.11)
RDW: 13 % (ref 11.5–15.5)
WBC: 6.2 10*3/uL (ref 4.0–10.5)
nRBC: 0 % (ref 0.0–0.2)

## 2022-06-09 LAB — MAGNESIUM: Magnesium: 2 mg/dL (ref 1.7–2.4)

## 2022-06-09 LAB — SURGICAL PATHOLOGY

## 2022-06-09 MED ORDER — ALUM & MAG HYDROXIDE-SIMETH 200-200-20 MG/5ML PO SUSP: 30.0000 mL | Freq: Four times a day (QID) | ORAL | Status: DC | PRN

## 2022-06-09 MED ORDER — PHENOL 1.4 % MT LIQD: 2.0000 | OROMUCOSAL | Status: DC | PRN

## 2022-06-09 MED ORDER — LIP MEDEX EX OINT
TOPICAL_OINTMENT | Freq: Two times a day (BID) | CUTANEOUS | Status: DC
  Administered 2022-06-09: 75 via TOPICAL
  Filled 2022-06-09 (×2): qty 7

## 2022-06-09 MED ORDER — CHLORHEXIDINE GLUCONATE CLOTH 2 % EX PADS: 6.0000 | MEDICATED_PAD | Freq: Every day | CUTANEOUS | Status: DC

## 2022-06-09 MED ORDER — SIMETHICONE 40 MG/0.6ML PO SUSP: 80.0000 mg | Freq: Four times a day (QID) | ORAL | Status: DC | PRN

## 2022-06-09 MED ORDER — BISACODYL 10 MG RE SUPP: 10.0000 mg | Freq: Two times a day (BID) | RECTAL | Status: DC | PRN

## 2022-06-09 MED ORDER — MENTHOL 3 MG MT LOZG: 1.0000 | LOZENGE | OROMUCOSAL | Status: DC | PRN

## 2022-06-09 MED ORDER — MAGIC MOUTHWASH: 15.0000 mL | Freq: Four times a day (QID) | ORAL | Status: DC | PRN

## 2022-06-09 MED ORDER — CALCIUM POLYCARBOPHIL 625 MG PO TABS
625.0000 mg | ORAL_TABLET | Freq: Two times a day (BID) | ORAL | Status: DC
  Administered 2022-06-09 – 2022-06-10 (×3): 625 mg via ORAL
  Filled 2022-06-09 (×4): qty 1

## 2022-06-09 NOTE — Progress Notes (Signed)
Patient being transferred to 5th floor with NT in wheelchair with fluids attached.

## 2022-06-09 NOTE — Progress Notes (Signed)
PROGRESS NOTE    Christy Kirby  YTK:[id number] DOB: 05/19/1951 DOA: 06/05/2022 PCP: Laurey Morale, MD    Chief Complaint  Patient presents with   Emesis    Brief Narrative:   72 year old female with no significant past medical history admitted with abdominal pain and middle back pain with nausea and vomiting. She had no urinary symptoms.   CT angiogram of the chest abdomen and pelvis did not show any acute findings. Her lactic acid level was elevated.  She was treated symptomatically with IV fluids and Zofran  Her electrolytes were low with low potassium magnesium and calcium these were all replaced. Patient ate a regular diet last night since she did not have any vomiting on 06/06/2022,.  But she vomited the dinner again the evening of 06/06/2022 with complaints of epigastric pain.   -Repeat CT abdomen and pelvis done with stranding in the ileum mesentery surrounding a low-density area in hindsight as seen on prior study measuring 19 x 17 mm, suggestion of some peripheral and nodular enhancement.  Findings nonspecific and of uncertain significance, differential diagnosis include Meckel's diverticulitis or cystic/necrotic lymph node or nodal metastases in the ileal mesentery, given nodular enhancement at the periphery underlying neoplasm will be difficult to exclude: Interloop abscess is felt less likely but given stranding not entirely excluded.  Would also correlate with any signs or history of GI bleed.  Surgical consultation may be helpful.  GI and general surgery consulted.  Patient for upper endoscopy 06/08/2022.     Assessment & Plan:   Principal Problem:   Abdominal pain with vomiting Active Problems:   Acute gastritis   Hypertensive urgency   Elevated lactic acid level   Intractable nausea and vomiting   Abnormal CT scan, gastrointestinal tract   Lactic acidosis   Hypokalemia   Hypomagnesemia   #1 nausea/vomiting/epigastric abdominal pain/gastritis per EGD  06/08/2022 -Initial concern for possible gastritis versus gastroenteritis, patient with no history of peptic ulcer disease. -Initial CT scan done on admission unremarkable. -Repeat CT scan done 06/07/2022 with stranding in the ileum mesentery surrounding a low-density area in hindsight as seen on prior study measuring 19 x 17 mm, suggestion of some peripheral and nodular enhancement.  Findings nonspecific and of uncertain significance, differential diagnosis include Meckel's diverticulitis or cystic/necrotic lymph node or nodal metastases in the ileal mesentery, given nodular enhancement at the periphery underlying neoplasm will be difficult to exclude: Interloop abscess is felt less likely but given stranding not entirely excluded.  Would also correlate with any signs or history of GI bleed.  Surgical consultation may be helpful.  GI and general surgery consulted.   -Patient status post upper endoscopy 06/08/2022, which showed tiny hiatal hernia, gastritis.  GI recommended PPI twice daily x 1 month and then daily as well as Carafate twice daily. -Patient improving clinically, tolerating full liquid diet and diet advanced to soft diet this morning per general surgery. -General surgery feels patient's symptoms may be secondary to gastritis, recommending holding NSAIDs and monitoring.  -General surgery reviewed films and feel well-circumscribed cystic mass does not look like an abscess, does not look like a phlegmon or gas associated with it, and feels likely incidental finding.   Per general surgery patient will likely need repeat CT enterography in about 3 months for further evaluation of questionable hypoechoic/fluid mass -Patient improving clinically. Continue supportive care. -GI and general surgery following.  2.  Hypokalemia/hypomagnesemia -Likely secondary to GI losses. -Repleted, potassium of 4.4, magnesium at 2.0.  3.  Hypocalcemia -Status post IV calcium gluconate with  improvement. -Hypocalcemia resolved.  4.  Hyperchloremia/acidosis -Secondary to nausea and emesis. -Improved with hydration.  5.  Lactic acidosis -Resolved with hydration. -No evidence of infection at this time. -No need for antibiotics at this time.  6.  Hypertensive urgency on admission -Noted on admission.  Patient noted to have received multiple doses of as needed IV labetalol. -Blood pressure currently controlled on Lopressor. -Outpatient follow-up.    DVT prophylaxis: SCDs Code Status: Full Family Communication: Updated patient.  No family at bedside. Disposition: Transfer to telemetry.  Likely home when clinically improved hopefully in the next 24 hours.   Status is: Inpatient Remains inpatient appropriate because: Severity of illness   Consultants:  General surgery: Dr. Johney Maine 06/07/2022 Gastroenterology: Dr. Rush Landmark 06/07/2022  Procedures:  CT abdomen and pelvis 06/05/2022, 06/07/2022 CT angiogram chest abdomen and pelvis 06/05/2022 Upper endoscopy: Per Dr. Rush Landmark 06/08/2022  Antimicrobials:  None   Subjective: Sitting up in bed.  Overall feeling better.  States still with some right lower quadrant abdominal pain which is slowly improving.  No nausea or emesis today.  Tolerating full liquids.  Asking when she is going to be able to go home.    Objective: Vitals:   06/09/22 0100 06/09/22 0200 06/09/22 0745 06/09/22 0805  BP: (!) 145/84 125/74 (!) 149/79 (!) 165/73  Pulse: 80 83 85 83  Resp: '14 19 16 19  '$ Temp:   98.3 F (36.8 C)   TempSrc:   Oral   SpO2: 99% 100% 98% 100%  Weight:      Height:        Intake/Output Summary (Last 24 hours) at 06/09/2022 0942 Last data filed at 06/09/2022 0800 Gross per 24 hour  Intake 1592.58 ml  Output --  Net 1592.58 ml    Filed Weights   06/07/22 0000 06/07/22 0449  Weight: 61.5 kg 61.5 kg    Examination:  General exam: NAD. Respiratory system: Lungs clear to auscultation bilaterally.  No wheezes, no crackles,  no rhonchi.  Fair air movement.  Speaking in full sentences.  Normal respiratory effort.   Cardiovascular system: Regular rate rhythm no murmurs rubs or gallops.  No JVD.  No lower extremity edema.   Gastrointestinal system: Abdomen is soft, nondistended, decreasing tenderness to palpation right lower quadrant.  Positive bowel sounds.  No rebound.  No guarding. Central nervous system: Alert and oriented. No focal neurological deficits. Extremities: Symmetric 5 x 5 power. Skin: No rashes, lesions or ulcers Psychiatry: Judgement and insight appear normal. Mood & affect appropriate.     Data Reviewed: I have personally reviewed following labs and imaging studies  CBC: Recent Labs  Lab 06/05/22 1247 06/06/22 0523 06/08/22 0801 06/09/22 0253  WBC 7.1 9.6 6.7 6.2  NEUTROABS 5.9  --  5.1 4.2  HGB 12.9 12.5 11.4* 11.4*  HCT 39.0 37.5 35.3* 35.8*  MCV 91.1 91.0 93.6 94.5  PLT 271 260 236 235     Basic Metabolic Panel: Recent Labs  Lab 06/06/22 0523 06/06/22 1508 06/06/22 2315 06/07/22 0334 06/08/22 0801 06/09/22 0253  NA 132* 143 137  --  135 137  K 3.3* <2.0* 4.9  --  4.5 4.4  CL 98 126* 105  --  103 103  CO2 24 15* 25  --  25 27  GLUCOSE 128* 59* 98  --  96 100*  BUN 9 5* 10  --  11 10  CREATININE 0.77 0.34* 0.77  --  0.98  0.96  CALCIUM 8.7* 4.0* 8.7*  --  8.7* 9.0  MG 1.4* 1.1*  --  2.9* 2.1 2.0     GFR: Estimated Creatinine Clearance: 46.1 mL/min (by C-G formula based on SCr of 0.96 mg/dL).  Liver Function Tests: Recent Labs  Lab 06/05/22 1247 06/06/22 0523 06/06/22 2315 06/08/22 0801  AST 21 30 33 20  ALT '14 13 14 14  '$ ALKPHOS 52 45 49 40  BILITOT 0.7 0.7 0.8 0.8  PROT 8.0 6.9 7.5 7.0  ALBUMIN 4.2 3.7 3.8 3.3*     CBG: No results for input(s): "GLUCAP" in the last 168 hours.   Recent Results (from the past 240 hour(s))  MRSA Next Gen by PCR, Nasal     Status: None   Collection Time: 06/07/22  1:02 AM   Specimen: Nasal Mucosa; Nasal Swab  Result  Value Ref Range Status   MRSA by PCR Next Gen NOT DETECTED NOT DETECTED Final    Comment: (NOTE) The GeneXpert MRSA Assay (FDA approved for NASAL specimens only), is one component of a comprehensive MRSA colonization surveillance program. It is not intended to diagnose MRSA infection nor to guide or monitor treatment for MRSA infections. Test performance is not FDA approved in patients less than 35 years old. Performed at [facility], [place] [address], [place], [place] [address]          Radiology Studies: CT ABDOMEN PELVIS W CONTRAST  Result Date: 06/07/2022 CLINICAL DATA:  Acute nonlocalized abdominal pain in a 72 year old female. EXAM: CT ABDOMEN AND PELVIS WITH CONTRAST TECHNIQUE: Multidetector CT imaging of the abdomen and pelvis was performed using the standard protocol following bolus administration of intravenous contrast. RADIATION DOSE REDUCTION: This exam was performed according to the departmental dose-optimization program which includes automated exposure control, adjustment of the mA and/or kV according to patient size and/or use of iterative reconstruction technique. CONTRAST:  162m OMNIPAQUE IOHEXOL 300 MG/ML  SOLN COMPARISON:  June 05, 2022 FINDINGS: Lower chest: Lung bases with basilar atelectasis. No sign of pleural effusion or consolidative process. Hepatobiliary: Vicarious excretion of contrast material into the gallbladder. Cholelithiasis. No pericholecystic stranding. No biliary duct dilation. No focal, suspicious hepatic lesion. The portal vein is patent. Pancreas: Normal, without mass, inflammation or ductal dilatation. Spleen: Normal. Adrenals/Urinary Tract: Adrenal glands are normal. Symmetric renal enhancement without signs of hydronephrosis. Urinary bladder is largely obscured by streak artifact from RIGHT hip arthroplasty. No suspicious renal lesion. No substantial perinephric stranding. Stomach/Bowel: Stomach without signs of adjacent  stranding. No sign of small bowel obstruction. Stranding in the ileal mesentery surrounding a low-density area. This low-density area measures 19 x 17 mm. There are surrounding small enhancing lymph nodes. There is no small bowel pneumatosis. There is no definitive communication of this area with the small bowel. The appendix is normal. The above area in hindsight as seen on the prior study. There is suggestion of some peripheral and nodular enhancement on coronal images. No signs of acute colonic findings. Vascular/Lymphatic: Aortic atherosclerosis. No sign of aneurysm. Smooth contour of the IVC. There is no gastrohepatic or hepatoduodenal ligament lymphadenopathy. No retroperitoneal or mesenteric lymphadenopathy. No pelvic sidewall lymphadenopathy. Reproductive: Uterus with multiple leiomyomata, not well assessed on CT due to streak artifact. Other: Trace free fluid in the pelvis, of uncertain significance. Musculoskeletal: Spinal degenerative changes without acute or destructive bone process. IMPRESSION: 1. Stranding in the ileal mesentery surrounding a low-density area in hindsight as seen on the prior study. This low-density area measures 19  x 17 mm. There is suggestion of some peripheral and nodular enhancement on coronal images. Findings are nonspecific and are of uncertain significance. Differential diagnosis would include Meckel's diverticulitis or cystic/necrotic lymph node or nodal metastasis in the ileal mesentery. Given nodular enhancement at the periphery underlying neoplasm would be difficult to exclude. Interloop abscess is felt less likely but given stranding not entirely excluded. Would also correlate with any signs of history of GI bleeding. Surgical consultation may be helpful. 2. No signs of pneumatosis or other secondary signs of bowel compromise. 3. Cholelithiasis without evidence of acute cholecystitis. 4. Trace free fluid in the pelvis, of uncertain significance. 5. Uterus with multiple  leiomyomata, not well assessed on CT due to streak artifact from RIGHT hip arthroplasty. 6. Aortic atherosclerosis. Aortic Atherosclerosis (ICD10-I70.0). Electronically Signed   By: Zetta Bills M.D.   On: 06/07/2022 11:47        Scheduled Meds:  Chlorhexidine Gluconate Cloth  6 each Topical QHS   lip balm   Topical BID   metoprolol tartrate  12.5 mg Oral BID   pantoprazole (PROTONIX) IV  40 mg Intravenous Q12H   polycarbophil  625 mg Oral BID   sodium chloride flush  3 mL Intravenous Q12H   sucralfate  1 g Oral BID   Continuous Infusions:  sodium chloride 50 mL/hr at 06/09/22 0800   promethazine (PHENERGAN) injection (IM or IVPB) Stopped (06/07/22 0543)     LOS: 2 days    Time spent: 40 minutes    Irine Seal, MD [facility]   To contact the attending provider between 7A-7P or the covering provider during after hours 7P-7A, please log into the web site [email or web address] and access using universal [place] password for that web site. If you do not have the password, please call the hospital operator.  06/09/2022, 9:42 AM

## 2022-06-09 NOTE — Progress Notes (Signed)
Christy Kirby [id number] Oct 11, 1950  CARE TEAM:  PCP: Laurey Morale, MD  Outpatient Care Team: Patient Care Team: Laurey Morale, MD as PCP - General  Inpatient Treatment Team: Treatment Team: Attending Provider: Eugenie Filler, MD; Consulting Physician: Edison Pace, Md, MD; Consulting Physician: Thornton Park, MD; Rounding Team: Threasa Beards, MD; Registered Nurse: Marzetta Merino, RN; Pharmacist: Adrian Saran, [facility]; Utilization Review: Antionette Char, RN   Problem List:   Principal Problem:   Abdominal pain with vomiting Active Problems:   Acute gastritis   Hypertensive urgency   Elevated lactic acid level   Intractable nausea and vomiting   Abnormal CT scan, gastrointestinal tract   Lactic acidosis   Hypokalemia   Hypomagnesemia   1 Day Post-Op  06/05/2022 - 06/08/2022  Procedure(s): ESOPHAGOGASTRODUODENOSCOPY (EGD) WITH PROPOFOL BIOPSY    Assessment  Clinically improving.  [facility] Stay = 2 days)  Plan  Abdominal pain, nausea, vomiting - Patient with acute onset abdominal pain, nausea, and vomiting without history of similar symptoms previously. General surgery asked to see given abnormal CT scan report: stranding in the ileal mesentery surrounding a low-density area in hindsight as seen on the prior study measuring 19 x 17 mm; there is suggestion of some peripheral and nodular enhancement; findings are nonspecific and of uncertain significance; differential diagnosis would include Meckel's diverticulitis or cystic/necrotic lymph node or nodal metastasis in the ileal mesentery; given nodular enhancement at the periphery underlying neoplasm would be difficult to exclude; interloop abscess is felt less likely but given stranding not entirely excluded; would also correlate with any signs of history of GI bleeding; surgical consultation may be helpful; no signs of pneumatosis or other secondary signs of bowel compromise -This looks like a simple  well-circumscribed cystic mass that is only 2 cm.  Does not look like a jejunal diverticulum.  Does not look like an abscess.  There is no phlegmon or gas associated with it.  I am skeptical that is of any major concern is just an incidental finding.  She is clinically improving which is reassuring as well.  -EGD showing gastritis.  Agree with PPI and Carafate.  Suspect this is the etiology of her nausea vomiting and pain.  Hold NSAIDs.  Tolerating liquids.  Advance to soft diet and heart healthy as tolerated.  - If patient improves and is able to tolerate a diet and go home, could consider repeat imaging (CT enterography) in about 3 months for further evaluation of questionable hypoechoic/fluid mass.  Make sure it is not a major concern.  - no indication for surgical intervention at this time, will follow peripherally    ID - none VTE - lovenox  FEN - NPO for EGD Foley - none   Hypertensive urgency        I reviewed nursing notes, Consultant GI notes, hospitalist notes, last 24 h vitals and pain scores, last 48 h intake and output, last 24 h labs and trends, and last 24 h imaging results. I have reviewed this patient's available data, including medical history, events of note, test results, etc as part of my evaluation.  A significant portion of that time was spent in counseling.  Care during the described time interval was provided by me.  This care required moderate level of medical decision making.  06/09/2022    Subjective: (Chief complaint)  Patient feeling better today.  Tolerated full liquids.  Some crampiness and bloating improved with flatus.  No more nausea or vomiting.  Nursing in room.  Objective:  Vital signs:  Vitals:   06/09/22 0100 06/09/22 0200 06/09/22 0745 06/09/22 0805  BP: (!) 145/84 125/74 (!) 149/79 (!) 165/73  Pulse: 80 83 85 83  Resp: '14 19 16 19  '$ Temp:   98.3 F (36.8 C)   TempSrc:   Oral   SpO2: 99% 100% 98% 100%  Weight:      Height:         Last BM Date : 06/05/22  Intake/Output   Yesterday:  01/03 0701 - 01/04 0700 In: 805.4 [I.V.:805.4] Out: -  This shift:  Total I/O In: 1592.6 [I.V.:1592.6] Out: -   Bowel function:  Flatus: YES  BM:  No  Drain: (No drain)   Physical Exam:  General: Pt awake/alert in no acute distress.  Home.  Smiling. Eyes: PERRL, normal EOM.  Sclera clear.  No icterus Neuro: CN II-XII intact w/o focal sensory/motor deficits. Lymph: No head/neck/groin lymphadenopathy Psych:  No delerium/psychosis/paranoia.  Oriented x 4 HENT: Normocephalic, Mucus membranes moist.  No thrush Neck: Supple, No tracheal deviation.  No obvious thyromegaly Chest: No pain to chest wall compression.  Good respiratory excursion.  No audible wheezing CV:  Pulses intact.  Regular rhythm.  No major extremity edema MS: Normal AROM mjr joints.  No obvious deformity  Abdomen: Soft.  Nondistended.  Mildly tender at incisions only.  No evidence of peritonitis.  No incarcerated hernias.  Ext:   No deformity.  No mjr edema.  No cyanosis Skin: No petechiae / purpurea.  No major sores.  Warm and dry    Results:   Cultures: Recent Results (from the past 720 hour(s))  MRSA Next Gen by PCR, Nasal     Status: None   Collection Time: 06/07/22  1:02 AM   Specimen: Nasal Mucosa; Nasal Swab  Result Value Ref Range Status   MRSA by PCR Next Gen NOT DETECTED NOT DETECTED Final    Comment: (NOTE) The GeneXpert MRSA Assay (FDA approved for NASAL specimens only), is one component of a comprehensive MRSA colonization surveillance program. It is not intended to diagnose MRSA infection nor to guide or monitor treatment for MRSA infections. Test performance is not FDA approved in patients less than 1 years old. Performed at [facility], [place] [address] [place], [place] [address]     Labs: Results for orders placed or performed during the hospital encounter of 06/05/22 (from the past 48 hour(s))   Lactic acid, plasma     Status: None   Collection Time: 06/07/22 10:20 AM  Result Value Ref Range   Lactic Acid, Venous 1.6 0.5 - 1.9 mmol/L    Comment: Performed at [facility], [place] [address], [place], [place] [address]  Lactic acid, plasma     Status: None   Collection Time: 06/07/22 12:55 PM  Result Value Ref Range   Lactic Acid, Venous 1.7 0.5 - 1.9 mmol/L    Comment: Performed at [facility], [place] [address], [place],  [address]  CBC with Differential/Platelet     Status: Abnormal   Collection Time: 06/08/22  8:01 AM  Result Value Ref Range   WBC 6.7 4.0 - 10.5 K/uL   RBC 3.77 (L) 3.87 - 5.11 MIL/uL   Hemoglobin 11.4 (L) 12.0 - 15.0 g/dL   HCT 35.3 (L) 36.0 - 46.0 %   MCV 93.6 80.0 - 100.0 fL   MCH 30.2 26.0 - 34.0 pg   MCHC 32.3 30.0 - 36.0  g/dL   RDW 13.3 11.5 - 15.5 %   Platelets 236 150 - 400 K/uL   nRBC 0.0 0.0 - 0.2 %   Neutrophils Relative % 76 %   Neutro Abs 5.1 1.7 - 7.7 K/uL   Lymphocytes Relative 16 %   Lymphs Abs 1.1 0.7 - 4.0 K/uL   Monocytes Relative 8 %   Monocytes Absolute 0.5 0.1 - 1.0 K/uL   Eosinophils Relative 0 %   Eosinophils Absolute 0.0 0.0 - 0.5 K/uL   Basophils Relative 0 %   Basophils Absolute 0.0 0.0 - 0.1 K/uL   Immature Granulocytes 0 %   Abs Immature Granulocytes 0.01 0.00 - 0.07 K/uL    Comment: Performed at [facility], [place] [address], [place], [place] [address]  Comprehensive metabolic panel     Status: Abnormal   Collection Time: 06/08/22  8:01 AM  Result Value Ref Range   Sodium 135 135 - 145 mmol/L   Potassium 4.5 3.5 - 5.1 mmol/L   Chloride 103 98 - 111 mmol/L   CO2 25 22 - 32 mmol/L   Glucose, Bld 96 70 - 99 mg/dL    Comment: Glucose reference range applies only to samples taken after fasting for at least 8 hours.   BUN 11 8 - 23 mg/dL   Creatinine, Ser 0.98 0.44 - 1.00 mg/dL   Calcium 8.7 (L) 8.9 - 10.3 mg/dL   Total Protein 7.0 6.5 - 8.1 g/dL   Albumin  3.3 (L) 3.5 - 5.0 g/dL   AST 20 15 - 41 U/L   ALT 14 0 - 44 U/L   Alkaline Phosphatase 40 38 - 126 U/L   Total Bilirubin 0.8 0.3 - 1.2 mg/dL   GFR, Estimated >60 >60 mL/min    Comment: (NOTE) Calculated using the CKD-EPI Creatinine Equation (2021)    Anion gap 7 5 - 15    Comment: Performed at [facility], [place] [address], [place], [place] [address]  Magnesium     Status: None   Collection Time: 06/08/22  8:01 AM  Result Value Ref Range   Magnesium 2.1 1.7 - 2.4 mg/dL    Comment: Performed at [facility], [place] [address], [place], [place] [address]  CBC with Differential/Platelet     Status: Abnormal   Collection Time: 06/09/22  2:53 AM  Result Value Ref Range   WBC 6.2 4.0 - 10.5 K/uL   RBC 3.79 (L) 3.87 - 5.11 MIL/uL   Hemoglobin 11.4 (L) 12.0 - 15.0 g/dL   HCT 35.8 (L) 36.0 - 46.0 %   MCV 94.5 80.0 - 100.0 fL   MCH 30.1 26.0 - 34.0 pg   MCHC 31.8 30.0 - 36.0 g/dL   RDW 13.0 11.5 - 15.5 %   Platelets 235 150 - 400 K/uL   nRBC 0.0 0.0 - 0.2 %   Neutrophils Relative % 66 %   Neutro Abs 4.2 1.7 - 7.7 K/uL   Lymphocytes Relative 23 %   Lymphs Abs 1.4 0.7 - 4.0 K/uL   Monocytes Relative 9 %   Monocytes Absolute 0.5 0.1 - 1.0 K/uL   Eosinophils Relative 1 %   Eosinophils Absolute 0.0 0.0 - 0.5 K/uL   Basophils Relative 1 %   Basophils Absolute 0.0 0.0 - 0.1 K/uL   Immature Granulocytes 0 %   Abs Immature Granulocytes 0.01 0.00 - 0.07 K/uL    Comment: Performed at [facility], [place] [address] [place],  [place] [address]  Basic metabolic panel     Status: Abnormal   Collection Time: 06/09/22  2:53 AM  Result Value Ref Range   Sodium 137 135 - 145 mmol/L   Potassium 4.4 3.5 - 5.1 mmol/L   Chloride 103 98 - 111 mmol/L   CO2 27 22 - 32 mmol/L   Glucose, Bld 100 (H) 70 - 99 mg/dL    Comment: Glucose reference range applies only to samples taken after fasting for at least 8 hours.   BUN 10 8 - 23 mg/dL   Creatinine, Ser  0.96 0.44 - 1.00 mg/dL   Calcium 9.0 8.9 - 10.3 mg/dL   GFR, Estimated >60 >60 mL/min    Comment: (NOTE) Calculated using the CKD-EPI Creatinine Equation (2021)    Anion gap 7 5 - 15    Comment: Performed at [facility], [place] [address], [place], [place] [address]  Magnesium     Status: None   Collection Time: 06/09/22  2:53 AM  Result Value Ref Range   Magnesium 2.0 1.7 - 2.4 mg/dL    Comment: Performed at [facility], [place] [address] [place], [place] [address]    Imaging / Studies: CT ABDOMEN PELVIS W CONTRAST  Result Date: 06/07/2022 CLINICAL DATA:  Acute nonlocalized abdominal pain in a 72 year old female. EXAM: CT ABDOMEN AND PELVIS WITH CONTRAST TECHNIQUE: Multidetector CT imaging of the abdomen and pelvis was performed using the standard protocol following bolus administration of intravenous contrast. RADIATION DOSE REDUCTION: This exam was performed according to the departmental dose-optimization program which includes automated exposure control, adjustment of the mA and/or kV according to patient size and/or use of iterative reconstruction technique. CONTRAST:  18m OMNIPAQUE IOHEXOL 300 MG/ML  SOLN COMPARISON:  June 05, 2022 FINDINGS: Lower chest: Lung bases with basilar atelectasis. No sign of pleural effusion or consolidative process. Hepatobiliary: Vicarious excretion of contrast material into the gallbladder. Cholelithiasis. No pericholecystic stranding. No biliary duct dilation. No focal, suspicious hepatic lesion. The portal vein is patent. Pancreas: Normal, without mass, inflammation or ductal dilatation. Spleen: Normal. Adrenals/Urinary Tract: Adrenal glands are normal. Symmetric renal enhancement without signs of hydronephrosis. Urinary bladder is largely obscured by streak artifact from RIGHT hip arthroplasty. No suspicious renal lesion. No substantial perinephric stranding. Stomach/Bowel: Stomach without signs of adjacent stranding. No  sign of small bowel obstruction. Stranding in the ileal mesentery surrounding a low-density area. This low-density area measures 19 x 17 mm. There are surrounding small enhancing lymph nodes. There is no small bowel pneumatosis. There is no definitive communication of this area with the small bowel. The appendix is normal. The above area in hindsight as seen on the prior study. There is suggestion of some peripheral and nodular enhancement on coronal images. No signs of acute colonic findings. Vascular/Lymphatic: Aortic atherosclerosis. No sign of aneurysm. Smooth contour of the IVC. There is no gastrohepatic or hepatoduodenal ligament lymphadenopathy. No retroperitoneal or mesenteric lymphadenopathy. No pelvic sidewall lymphadenopathy. Reproductive: Uterus with multiple leiomyomata, not well assessed on CT due to streak artifact. Other: Trace free fluid in the pelvis, of uncertain significance. Musculoskeletal: Spinal degenerative changes without acute or destructive bone process. IMPRESSION: 1. Stranding in the ileal mesentery surrounding a low-density area in hindsight as seen on the prior study. This low-density area measures 19 x 17 mm. There is suggestion of some peripheral and nodular enhancement on coronal images. Findings are nonspecific and are of uncertain significance. Differential diagnosis would include Meckel's diverticulitis or cystic/necrotic lymph node  or nodal metastasis in the ileal mesentery. Given nodular enhancement at the periphery underlying neoplasm would be difficult to exclude. Interloop abscess is felt less likely but given stranding not entirely excluded. Would also correlate with any signs of history of GI bleeding. Surgical consultation may be helpful. 2. No signs of pneumatosis or other secondary signs of bowel compromise. 3. Cholelithiasis without evidence of acute cholecystitis. 4. Trace free fluid in the pelvis, of uncertain significance. 5. Uterus with multiple leiomyomata, not  well assessed on CT due to streak artifact from RIGHT hip arthroplasty. 6. Aortic atherosclerosis. Aortic Atherosclerosis (ICD10-I70.0). Electronically Signed   By: Zetta Bills M.D.   On: 06/07/2022 11:47    Medications / Allergies: per chart  Antibiotics: Anti-infectives (From admission, onward)    None         Note: Portions of this report may have been transcribed using voice recognition software. Every effort was made to ensure accuracy; however, inadvertent computerized transcription errors may be present.   Any transcriptional errors that result from this process are unintentional.    Adin Hector, MD, FACS, MASCRS Esophageal, Gastrointestinal & Colorectal Surgery Robotic and Minimally Invasive Surgery  [facility]. [address] [place], [place] [address] [phone] Fax [phone] Main  CONTACT INFORMATION:  Weekday (9AM-5PM): Call [facility] main office at [phone]  Weeknight (5PM-9AM) or Weekend/Holiday: Check [email or web address] (password " [email or web address]") for General Surgery [facility] coverage  (Please, do not use SecureChat as it is not reliable communication to reach operating surgeons for immediate patient care given surgeries/outpatient duties/clinic/cross-coverage/off post-call which would lead to a delay in care.  Epic staff messaging available for outptient concerns, but may not be answered for 48 hours or more).     06/09/2022  8:08 AM

## 2022-06-09 NOTE — Progress Notes (Signed)
Daily Progress Note  Hospital Day: 5  Chief Complaint:   Assessment   Patient profile:  Yanet Balliet is a 72 y.o. female with a pmh of GERD, cholelithiasis,  and HLD. Admitted 12/31 with abdominal pain and vomiting. Also had hypertensive urgency  # Acute nausea, vomiting, abdominal pain. Felt to be gastroenteritis.  Abdomen sore but she believes it to be muscular related to the vomiting prior to admission Tolerated solid diet this am.   Diarrhea resolved. No BM since Sunday   # Abnormal CT scan showing stranding in the ileal mesentery with surrounding small enhancing lymph nodes.  Could be incidental finding unrelated to current symptoms. Last colonoscopy Jan 2019 - complete, excellent prep. One small polyp in the ascending colon was removed but it was not pre-cancerous.   Plan:    [place] for discharge from our standpoint Will need CTE abdomen in 3 weeks. Our office will arrange this   Subjective  No nausea / vomiting. Had solids earlier. No diarrhea. Abdomen is sore but she thinks it is muscular related to the vomiting.    Objective  Endoscopic studies:  -No gross lesions in the esophagus. Z-line irregular, 36 cm from the incisors. - 1 cm hiatal hernia. - Gastritis. No other gross lesions in the entire stomach. Biopsied. -Duodenum normal.    Imaging:  CT ABDOMEN PELVIS W CONTRAST  Result Date: 06/07/2022 CLINICAL DATA:  Acute nonlocalized abdominal pain in a 72 year old female. EXAM: CT ABDOMEN AND PELVIS WITH CONTRAST TECHNIQUE: Multidetector CT imaging of the abdomen and pelvis was performed using the standard protocol following bolus administration of intravenous contrast. RADIATION DOSE REDUCTION: This exam was performed according to the departmental dose-optimization program which includes automated exposure control, adjustment of the mA and/or kV according to patient size and/or use of iterative reconstruction technique. CONTRAST:  186m OMNIPAQUE IOHEXOL 300  MG/ML  SOLN COMPARISON:  June 05, 2022 FINDINGS: Lower chest: Lung bases with basilar atelectasis. No sign of pleural effusion or consolidative process. Hepatobiliary: Vicarious excretion of contrast material into the gallbladder. Cholelithiasis. No pericholecystic stranding. No biliary duct dilation. No focal, suspicious hepatic lesion. The portal vein is patent. Pancreas: Normal, without mass, inflammation or ductal dilatation. Spleen: Normal. Adrenals/Urinary Tract: Adrenal glands are normal. Symmetric renal enhancement without signs of hydronephrosis. Urinary bladder is largely obscured by streak artifact from RIGHT hip arthroplasty. No suspicious renal lesion. No substantial perinephric stranding. Stomach/Bowel: Stomach without signs of adjacent stranding. No sign of small bowel obstruction. Stranding in the ileal mesentery surrounding a low-density area. This low-density area measures 19 x 17 mm. There are surrounding small enhancing lymph nodes. There is no small bowel pneumatosis. There is no definitive communication of this area with the small bowel. The appendix is normal. The above area in hindsight as seen on the prior study. There is suggestion of some peripheral and nodular enhancement on coronal images. No signs of acute colonic findings. Vascular/Lymphatic: Aortic atherosclerosis. No sign of aneurysm. Smooth contour of the IVC. There is no gastrohepatic or hepatoduodenal ligament lymphadenopathy. No retroperitoneal or mesenteric lymphadenopathy. No pelvic sidewall lymphadenopathy. Reproductive: Uterus with multiple leiomyomata, not well assessed on CT due to streak artifact. Other: Trace free fluid in the pelvis, of uncertain significance. Musculoskeletal: Spinal degenerative changes without acute or destructive bone process. IMPRESSION: 1. Stranding in the ileal mesentery surrounding a low-density area in hindsight as seen on the prior study. This low-density area measures 19 x 17 mm. There is  suggestion of some  peripheral and nodular enhancement on coronal images. Findings are nonspecific and are of uncertain significance. Differential diagnosis would include Meckel's diverticulitis or cystic/necrotic lymph node or nodal metastasis in the ileal mesentery. Given nodular enhancement at the periphery underlying neoplasm would be difficult to exclude. Interloop abscess is felt less likely but given stranding not entirely excluded. Would also correlate with any signs of history of GI bleeding. Surgical consultation may be helpful. 2. No signs of pneumatosis or other secondary signs of bowel compromise. 3. Cholelithiasis without evidence of acute cholecystitis. 4. Trace free fluid in the pelvis, of uncertain significance. 5. Uterus with multiple leiomyomata, not well assessed on CT due to streak artifact from RIGHT hip arthroplasty. 6. Aortic atherosclerosis. Aortic Atherosclerosis (ICD10-I70.0). Electronically Signed   By: Zetta Bills M.D.   On: 06/07/2022 11:47   CT Angio Chest/Abd/Pel for Dissection W and/or Wo Contrast  Result Date: 06/05/2022 CLINICAL DATA:  Chest and abdominal pain, initial encounter EXAM: CT ANGIOGRAPHY CHEST, ABDOMEN AND PELVIS TECHNIQUE: Non-contrast CT of the chest was initially obtained. Multidetector CT imaging through the chest, abdomen and pelvis was performed using the standard protocol during bolus administration of intravenous contrast. Multiplanar reconstructed images and MIPs were obtained and reviewed to evaluate the vascular anatomy. RADIATION DOSE REDUCTION: This exam was performed according to the departmental dose-optimization program which includes automated exposure control, adjustment of the mA and/or kV according to patient size and/or use of iterative reconstruction technique. CONTRAST:  157m OMNIPAQUE IOHEXOL 350 MG/ML SOLN COMPARISON:  CT of the abdomen pelvis from 5 hours previous FINDINGS: CTA CHEST FINDINGS Cardiovascular: Thoracic aorta demonstrates  atherosclerotic calcifications without aneurysmal dilatation or dissection. No cardiac enlargement is seen. The pulmonary artery as visualized shows no central pulmonary emboli. Mediastinum/Nodes: Thoracic inlet is within normal limits. No hilar or mediastinal adenopathy is noted. The esophagus as visualized is within normal limits. Lungs/Pleura: The lungs are well aerated bilaterally. No focal infiltrate or sizable effusion is seen. No sizable parenchymal nodule is noted. Musculoskeletal: Degenerative changes of the thoracic spine are seen. No acute rib abnormality is noted. Review of the MIP images confirms the above findings. CTA ABDOMEN AND PELVIS FINDINGS VASCULAR Aorta: Abdominal aorta is well visualized similar to that seen on the recent CT examination. No significant atherosclerotic calcifications are noted. No aneurysmal dilatation or dissection is noted. Celiac: Patent without evidence of aneurysm, dissection, vasculitis or significant stenosis. SMA: Patent without evidence of aneurysm, dissection, vasculitis or significant stenosis. Renals: Both renal arteries are patent without evidence of aneurysm, dissection, vasculitis, fibromuscular dysplasia or significant stenosis. IMA: Patent without evidence of aneurysm, dissection, vasculitis or significant stenosis. Inflow: Iliacs are within normal limits. Mild atherosclerotic calcifications are seen. No focal stenosis or dissection is noted. Veins: None no specific venous abnormality is noted. Review of the MIP images confirms the above findings. NON-VASCULAR Hepatobiliary: Liver is within normal limits. Gallbladder is well distended with multiple dependent gallstones. No complicating factors are seen. Pancreas: Unremarkable. No pancreatic ductal dilatation or surrounding inflammatory changes. Spleen: Normal in size without focal abnormality. Adrenals/Urinary Tract: Adrenal glands are within normal limits. Kidneys demonstrate a normal enhancement pattern  bilaterally. Normal excretion is noted bilaterally as well. No renal calculi are seen. The bladder is decompressed. Contrast enhanced urine is noted within. Stomach/Bowel: Colon shows no obstructive or inflammatory changes. The appendix is within normal limits. Small bowel and stomach are unremarkable. Lymphatic: No lymphadenopathy is seen. Reproductive: Lobular uterus is noted consistent with uterine fibroid change. The overall appearance is stable  from 5 hours previous. Other: No abdominal wall hernia or abnormality. No abdominopelvic ascites. Musculoskeletal: Right hip arthroplasty. No acute bony abnormality is noted. Review of the MIP images confirms the above findings. IMPRESSION: CTA of the chest: No evidence of aortic dissection or aneurysmal dilatation. No evidence of pulmonary emboli. No acute abnormality noted. CTA of the abdomen and pelvis: The overall appearance is similar to that seen 5 hours previous. The aorta is again unremarkable. Cholelithiasis without complicating factors. Uterine fibroid change. Electronically Signed   By: Inez Catalina M.D.   On: 06/05/2022 21:45   CT ABDOMEN PELVIS W CONTRAST  Result Date: 06/05/2022 CLINICAL DATA:  Abdominal pain, acute nonlocalized. EXAM: CT ABDOMEN AND PELVIS WITH CONTRAST TECHNIQUE: Multidetector CT imaging of the abdomen and pelvis was performed using the standard protocol following bolus administration of intravenous contrast. RADIATION DOSE REDUCTION: This exam was performed according to the departmental dose-optimization program which includes automated exposure control, adjustment of the mA and/or kV according to patient size and/or use of iterative reconstruction technique. CONTRAST:  17m OMNIPAQUE IOHEXOL 300 MG/ML  SOLN COMPARISON:  No recent relevant priors available for comparison at time of dictation. FINDINGS: Lower chest: No acute abnormality. Hepatobiliary: No acute osseous abnormality. Cholelithiasis without findings of acute  cholecystitis. No biliary ductal dilation. Pancreas: No pancreatic ductal dilation or evidence of acute inflammation. Spleen: No splenomegaly. Adrenals/Urinary Tract: Bilateral adrenal glands appear normal. No hydronephrosis. Kidneys demonstrate symmetric enhancement. Urinary bladder is unremarkable for degree of distension. Stomach/Bowel: Stomach is unremarkable for degree of distension. No pathologic dilation of small or large bowel. The appendix and terminal ileum appear normal. No evidence of acute bowel inflammation. Evaluation of loops of bowel in the pelvis is limited by streak artifact from right hip arthroplasty. Vascular/Lymphatic: Normal caliber abdominal aorta. No pathologically enlarged abdominal or pelvic lymph nodes. Reproductive: Multiple masses in an enlarged lobular uterus measuring up to 4.1 cm commonly reflect uterine leiomyomas. No suspicious adnexal mass. Other: Trace pelvic free fluid. Musculoskeletal: Right total hip arthroplasty. Multilevel degenerative changes spine. No acute osseous abnormality. IMPRESSION: 1. No acute abnormality in the abdomen or pelvis. 2. Cholelithiasis without findings of acute cholecystitis. 3. Multiple masses in an enlarged lobular uterus measuring up to 4.1 cm commonly reflect uterine leiomyomas. Electronically Signed   By: JDahlia BailiffM.D.   On: 06/05/2022 17:05    Lab Results: Recent Labs    06/08/22 0801 06/09/22 0253  WBC 6.7 6.2  HGB 11.4* 11.4*  HCT 35.3* 35.8*  PLT 236 235   BMET Recent Labs    06/06/22 2315 06/08/22 0801 06/09/22 0253  NA 137 135 137  K 4.9 4.5 4.4  CL 105 103 103  CO2 '25 25 27  '$ GLUCOSE 98 96 100*  BUN '10 11 10  '$ CREATININE 0.77 0.98 0.96  CALCIUM 8.7* 8.7* 9.0   LFT Recent Labs    06/08/22 0801  PROT 7.0  ALBUMIN 3.3*  AST 20  ALT 14  ALKPHOS 40  BILITOT 0.8   PT/INR No results for input(s): "LABPROT", "INR" in the last 72 hours.   Scheduled inpatient medications:   Chlorhexidine Gluconate Cloth   6 each Topical QHS   lip balm   Topical BID   metoprolol tartrate  12.5 mg Oral BID   pantoprazole (PROTONIX) IV  40 mg Intravenous Q12H   polycarbophil  625 mg Oral BID   sodium chloride flush  3 mL Intravenous Q12H   sucralfate  1 g Oral BID   Continuous  inpatient infusions:   sodium chloride 50 mL/hr at 06/09/22 0800   promethazine (PHENERGAN) injection (IM or IVPB) Stopped (06/07/22 0543)   PRN inpatient medications: acetaminophen, alum & mag hydroxide-simeth, bisacodyl, HYDROmorphone (DILAUDID) injection, labetalol, magic mouthwash, menthol-cetylpyridinium, ondansetron (ZOFRAN) IV, mouth rinse, oxyCODONE, phenol, promethazine (PHENERGAN) injection (IM or IVPB), simethicone  Vital signs in last 24 hours: Temp:  [98.3 F (36.8 C)] 98.3 F (36.8 C) (01/04 0745) Pulse Rate:  [79-92] 92 (01/04 1026) Resp:  [12-23] 12 (01/04 1026) BP: (125-165)/(60-87) 136/79 (01/04 1026) SpO2:  [98 %-100 %] 100 % (01/04 1026) Last BM Date : 06/05/22  Intake/Output Summary (Last 24 hours) at 06/09/2022 1306 Last data filed at 06/09/2022 0800 Gross per 24 hour  Intake 1592.58 ml  Output --  Net 1592.58 ml    Intake/Output from previous day: 01/03 0701 - 01/04 0700 In: 805.4 [I.V.:805.4] Out: -  Intake/Output this shift: Total I/O In: 1592.6 [I.V.:1592.6] Out: -    Physical Exam:  General: Alert female in NAD Heart:  Regular rate and rhythm.  Pulmonary: Normal respiratory effort Abdomen: Soft, nondistended, nontender. Normal bowel sounds. Extremities: No lower extremity edema  Neurologic: Alert and oriented Psych: Pleasant. Cooperative.    Principal Problem:   Abdominal pain with vomiting Active Problems:   Hypertensive urgency   Elevated lactic acid level   Intractable nausea and vomiting   Abnormal CT scan, gastrointestinal tract   Lactic acidosis   Hypokalemia   Hypomagnesemia   Acute gastritis     LOS: 2 days   Tye Savoy ,NP 06/09/2022, 1:06 PM

## 2022-06-10 ENCOUNTER — Telehealth: Payer: Self-pay

## 2022-06-10 ENCOUNTER — Encounter: Payer: Self-pay | Admitting: Gastroenterology

## 2022-06-10 DIAGNOSIS — I160 Hypertensive urgency: Secondary | ICD-10-CM | POA: Diagnosis not present

## 2022-06-10 DIAGNOSIS — R935 Abnormal findings on diagnostic imaging of other abdominal regions, including retroperitoneum: Secondary | ICD-10-CM | POA: Diagnosis not present

## 2022-06-10 DIAGNOSIS — K2900 Acute gastritis without bleeding: Secondary | ICD-10-CM

## 2022-06-10 DIAGNOSIS — K529 Noninfective gastroenteritis and colitis, unspecified: Principal | ICD-10-CM

## 2022-06-10 DIAGNOSIS — R109 Unspecified abdominal pain: Secondary | ICD-10-CM | POA: Diagnosis not present

## 2022-06-10 MED ORDER — PANTOPRAZOLE SODIUM 40 MG PO TBEC: DELAYED_RELEASE_TABLET | ORAL | 0 refills | Status: DC

## 2022-06-10 MED ORDER — TRAMADOL HCL 50 MG PO TABS: 50.0000 mg | ORAL_TABLET | Freq: Four times a day (QID) | ORAL | 0 refills | Status: DC | PRN

## 2022-06-10 MED ORDER — METOPROLOL TARTRATE 25 MG PO TABS: 12.5000 mg | ORAL_TABLET | Freq: Two times a day (BID) | ORAL | 1 refills | Status: DC

## 2022-06-10 MED ORDER — ONDANSETRON HCL 4 MG PO TABS: 4.0000 mg | ORAL_TABLET | Freq: Three times a day (TID) | ORAL | 0 refills | Status: DC | PRN

## 2022-06-10 MED ORDER — SUCRALFATE 1 G PO TABS: 1.0000 g | ORAL_TABLET | Freq: Two times a day (BID) | ORAL | 1 refills | Status: DC

## 2022-06-10 MED ORDER — PANTOPRAZOLE SODIUM 40 MG PO TBEC
40.0000 mg | DELAYED_RELEASE_TABLET | Freq: Two times a day (BID) | ORAL | Status: DC
  Administered 2022-06-10: 40 mg via ORAL
  Filled 2022-06-10: qty 1

## 2022-06-10 MED ORDER — ACETAMINOPHEN 325 MG PO TABS: 650.0000 mg | ORAL_TABLET | Freq: Four times a day (QID) | ORAL | Status: AC | PRN

## 2022-06-10 MED ORDER — CALCIUM POLYCARBOPHIL 625 MG PO TABS: 625.0000 mg | ORAL_TABLET | Freq: Two times a day (BID) | ORAL | Status: DC

## 2022-06-10 NOTE — Telephone Encounter (Signed)
-----   Message from Irving Copas., MD sent at 06/10/2022  2:36 AM EST ----- Regarding: Follow-up Christy Kirby, This patient needs a CT enterography in 4 to 6 weeks for follow-up of abnormal CT abdomen and potential necrotic lymph node versus less likely neoplasm.  Patient evaluated by general surgery and wanted a longer out CT but I would rather feel more comfortable by doing this sooner. Please obtain the CT enterography and set up follow-up with one of the APP's or with JP her primary gastroenterologist after the CT enterography has been completed. Will update you all when the results of the EGD biopsies returned. Thanks. GM

## 2022-06-10 NOTE — Progress Notes (Addendum)
[facility] Progress Note  2 Days Post-Op  Subjective: CC-  Overall continues to feel better. Still has some intermittent soreness in her left flank. Denies n/v. Tolerated solid food for dinner. Passing flatus, no BM since admission.  Objective: Vital signs in last 24 hours: Temp:  [98 F (36.7 C)-99 F (37.2 C)] 99 F (37.2 C) (01/05 0648) Pulse Rate:  [83-103] 92 (01/05 0648) Resp:  [12-19] 18 (01/05 0648) BP: (110-165)/(64-97) 128/80 (01/05 0648) SpO2:  [97 %-100 %] 97 % (01/05 0648) Last BM Date : 06/05/22  Intake/Output from previous day: 01/04 0701 - 01/05 0700 In: 3037.6 [P.O.:478; I.V.:2559.6] Out: -  Intake/Output this shift: Total I/O In: 533.3 [I.V.:533.3] Out: -   PE: Gen:  Alert, NAD, pleasant Abd: soft, ND, NT  Lab Results:  Recent Labs    06/08/22 0801 06/09/22 0253  WBC 6.7 6.2  HGB 11.4* 11.4*  HCT 35.3* 35.8*  PLT 236 235   BMET Recent Labs    06/08/22 0801 06/09/22 0253  NA 135 137  K 4.5 4.4  CL 103 103  CO2 25 27  GLUCOSE 96 100*  BUN 11 10  CREATININE 0.98 0.96  CALCIUM 8.7* 9.0   PT/INR No results for input(s): "LABPROT", "INR" in the last 72 hours. CMP     Component Value Date/Time   NA 137 06/09/2022 0253   K 4.4 06/09/2022 0253   CL 103 06/09/2022 0253   CO2 27 06/09/2022 0253   GLUCOSE 100 (H) 06/09/2022 0253   BUN 10 06/09/2022 0253   CREATININE 0.96 06/09/2022 0253   CALCIUM 9.0 06/09/2022 0253   PROT 7.0 06/08/2022 0801   ALBUMIN 3.3 (L) 06/08/2022 0801   AST 20 06/08/2022 0801   ALT 14 06/08/2022 0801   ALKPHOS 40 06/08/2022 0801   BILITOT 0.8 06/08/2022 0801   GFRNONAA >60 06/09/2022 0253   GFRAA >60 01/12/2018 0516   Lipase     Component Value Date/Time   LIPASE 25 06/05/2022 1247       Studies/Results: No results found.  Anti-infectives: Anti-infectives (From admission, onward)    None        Assessment/Plan Abdominal pain, nausea, vomiting - Patient with acute onset  abdominal pain, nausea, and vomiting without history of similar symptoms previously. General surgery asked to see given abnormal CT scan report: stranding in the ileal mesentery surrounding a low-density area in hindsight as seen on the prior study measuring 19 x 17 mm; there is suggestion of some peripheral and nodular enhancement; findings are nonspecific and of uncertain significance; differential diagnosis would include Meckel's diverticulitis or cystic/necrotic lymph node or nodal metastasis in the ileal mesentery; given nodular enhancement at the periphery underlying neoplasm would be difficult to exclude; interloop abscess is felt less likely but given stranding not entirely excluded; would also correlate with any signs of history of GI bleeding; surgical consultation may be helpful; no signs of pneumatosis or other secondary signs of bowel compromise >>This looks like a simple well-circumscribed cystic mass that is only 2 cm. Does not look like a jejunal diverticulum. Does not look like an abscess. There is no phlegmon or gas associated with it. I am skeptical that is of any major concern is just an incidental finding. She is clinically improving which is reassuring as well.  - s/p EGD: gastritis. On PPI and carafare - Tolerating HH diet and overall improved. N/v resolved. No indication for acute surgical intervention. [place] with discharge from surgical standpoint. Agree with GI  follow up and CT enterography as outpatient. We will sign off, please call with questions or concerns.    ID - none VTE - lovenox  FEN - HH diet Foley - none   Hypertensive urgency    LOS: 3 days    Wellington Hampshire, PA-C [facility] 06/10/2022, 7:00 AM Please see Amion for pager number during day hours 7:00am-4:30pm

## 2022-06-10 NOTE — Discharge Summary (Signed)
Physician Discharge Summary  Christy Kirby XKG:[id number] DOB: Oct 27, 1950 DOA: 06/05/2022  PCP: Laurey Morale, MD  Admit date: 06/05/2022 Discharge date: 06/10/2022  Time spent: 60 minutes  Recommendations for Outpatient Follow-up:  Follow-up with Laurey Morale, MD in 2 weeks.  On follow-up patient will need a basic metabolic profile, magnesium level checked to follow-up on electrolytes and renal function.  Patient's blood pressure need to be reassessed on follow-up as patient started on low-dose beta-blocker. Follow-up with Dr. Henrene Pastor, gastroenterology in 3 weeks.  On follow-up patient will need to be set up for CT enterography to follow-up on hypoechoic fluid/mass noted on CT during the hospitalization.   Discharge Diagnoses:  Principal Problem:   Abdominal pain with vomiting Active Problems:   Acute gastritis   Hypertensive urgency   Elevated lactic acid level   Intractable nausea and vomiting   Abnormal CT scan, gastrointestinal tract   Lactic acidosis   Hypokalemia   Hypomagnesemia   Gastroenteritis   Abnormal CT of the abdomen   Discharge Condition: Stable and improved.  Diet recommendation: Heart healthy  Filed Weights   06/07/22 0000 06/07/22 0449 06/10/22 0648  Weight: 61.5 kg 61.5 kg 62.4 kg    History of present illness:  HPI per Dr. Ammie Kirby is a pleasant 72 y.o. female who denies any significant past medical history now presents to the emergency department with pain in her abdomen, left flank, and nausea with nonbloody vomiting.   Patient reports that she developed the symptoms that 4 AM on 06/05/2022 and they have been persistent since.  She denies diarrhea, fever, chills, sick contacts, or urinary symptoms.   ED Course: Upon arrival to the ED, patient is found to be afebrile and saturating well on room air with severely elevated blood pressure.  CMP and CBC are unremarkable, troponin is normal, lipase was normal, and lactic acid was elevated to  2.8 and then 3.1.  There is no PE or aortic dissection or aneurysm on CTA, and no acute intra-abdominal or pelvic abnormalities on CT.   She was given 1 L of LR, 2 doses of Zofran, droperidol, Norco, fentanyl, Maalox, and 2 doses of IV labetalol in the ED.   Hospital Course:  #1 nausea/vomiting/epigastric abdominal pain/gastritis per EGD 06/08/2022 -Initial concern for possible gastritis versus gastroenteritis, patient with no history of peptic ulcer disease. -Initial CT scan done on admission unremarkable. -Repeat CT scan done 06/07/2022 with stranding in the ileum mesentery surrounding a low-density area in hindsight as seen on prior study measuring 19 x 17 mm, suggestion of some peripheral and nodular enhancement.  Findings nonspecific and of uncertain significance, differential diagnosis include Meckel's diverticulitis or cystic/necrotic lymph node or nodal metastases in the ileal mesentery, given nodular enhancement at the periphery underlying neoplasm will be difficult to exclude: Interloop abscess is felt less likely but given stranding not entirely excluded.  Would also correlate with any signs or history of GI bleed.  Surgical consultation may be helpful.  GI and general surgery consulted and followed the patient throughout the hospitalization.   -Patient status post upper endoscopy 06/08/2022, which showed tiny hiatal hernia, gastritis.  GI recommended PPI twice daily x 1 month and then daily as well as Carafate twice daily. -Patient improved clinically, diet started from clears and advance to a full liquid diet and subsequently a soft diet which patient tolerated.  -General surgery feels patient's symptoms may be secondary to gastritis, recommending holding NSAIDs and monitoring.  -General surgery reviewed  films and feel well-circumscribed cystic mass does not look like an abscess, does not look like a phlegmon or gas associated with it, and feels likely incidental finding.   Per general surgery  patient will likely need repeat CT enterography in the outpatient setting for further evaluation of questionable hypoechoic/fluid mass.  -Patient improved clinically and will be discharged in stable and improved condition.   2.  Hypokalemia/hypomagnesemia -Likely secondary to GI losses. -Repleted during the hospitalization. -Outpatient follow-up with PCP.   3.  Hypocalcemia -Status post IV calcium gluconate with improvement. -Hypocalcemia resolved.   4.  Hyperchloremia/acidosis -Secondary to nausea and emesis. -Resolved with hydration.   5.  Lactic acidosis -Resolved with hydration. -No evidence of infection during the hospitalization. -No need for antibiotics.   6.  Hypertensive urgency on admission -Noted on admission.  Patient noted to have received multiple doses of as needed IV labetalol. -Blood pressure controlled on Lopressor. -Outpatient follow-up with PCP for further blood pressure management.      Procedures: CT abdomen and pelvis 06/05/2022, 06/07/2022 CT angiogram chest abdomen and pelvis 06/05/2022 Upper endoscopy: Per Dr. Rush Landmark 06/08/2022  Consultations: General surgery: Dr. Johney Maine 06/07/2022 Gastroenterology: Dr. Rush Landmark 06/07/2022  Discharge Exam: Vitals:   06/09/22 1944 06/10/22 0648  BP: 133/81 128/80  Pulse: (!) 101 92  Resp: 18 18  Temp: 98 F (36.7 C) 99 F (37.2 C)  SpO2: 99% 97%    General: NAD Cardiovascular: RRR. no m/r/g. Respiratory: Clear to auscultation bilaterally.  No wheezes, no crackles, no rhonchi.  Fair air movement.  Speaking in full sentences.  Discharge Instructions   Discharge Instructions     Diet - low sodium heart healthy   Complete by: As directed    Increase activity slowly   Complete by: As directed       Allergies as of 06/10/2022       Reactions   Codeine Nausea Only   Nsaids Other (See Comments)   History of gastritis = minimize NSAIDs        Medication List     TAKE these medications     acetaminophen 325 MG tablet Commonly known as: TYLENOL Take 2 tablets (650 mg total) by mouth every 6 (six) hours as needed for mild pain or headache.   metoprolol tartrate 25 MG tablet Commonly known as: LOPRESSOR Take 0.5 tablets (12.5 mg total) by mouth 2 (two) times daily.   ondansetron 4 MG tablet Commonly known as: Zofran Take 1 tablet (4 mg total) by mouth every 8 (eight) hours as needed for nausea or vomiting.   pantoprazole 40 MG tablet Commonly known as: PROTONIX Take 1 tablet (40 mg total) by mouth 2 (two) times daily for 30 days, THEN 1 tablet (40 mg total) daily. Start taking on: June 10, 2022   polycarbophil 625 MG tablet Commonly known as: FIBERCON Take 1 tablet (625 mg total) by mouth 2 (two) times daily.   sucralfate 1 g tablet Commonly known as: CARAFATE Take 1 tablet (1 g total) by mouth 2 (two) times daily.   traMADol 50 MG tablet Commonly known as: Ultram Take 1 tablet (50 mg total) by mouth every 6 (six) hours as needed.       Allergies  Allergen Reactions   Codeine Nausea Only   Nsaids Other (See Comments)    History of gastritis = minimize NSAIDs    Follow-up Information     Laurey Morale, MD. Schedule an appointment as soon as possible for a visit in  2 week(s).   Specialty: Family Medicine Contact information: [place] [address] [phone]         Irene Shipper, MD. Schedule an appointment as soon as possible for a visit in 3 week(s).   Specialty: Gastroenterology Contact information: [address] [place] [address] [phone]                  The results of significant diagnostics from this hospitalization (including imaging, microbiology, ancillary and laboratory) are listed below for reference.    Significant Diagnostic Studies: CT ABDOMEN PELVIS W CONTRAST  Result Date: 06/07/2022 CLINICAL DATA:  Acute nonlocalized abdominal pain in a 72 year old female. EXAM: CT ABDOMEN  AND PELVIS WITH CONTRAST TECHNIQUE: Multidetector CT imaging of the abdomen and pelvis was performed using the standard protocol following bolus administration of intravenous contrast. RADIATION DOSE REDUCTION: This exam was performed according to the departmental dose-optimization program which includes automated exposure control, adjustment of the mA and/or kV according to patient size and/or use of iterative reconstruction technique. CONTRAST:  150m OMNIPAQUE IOHEXOL 300 MG/ML  SOLN COMPARISON:  June 05, 2022 FINDINGS: Lower chest: Lung bases with basilar atelectasis. No sign of pleural effusion or consolidative process. Hepatobiliary: Vicarious excretion of contrast material into the gallbladder. Cholelithiasis. No pericholecystic stranding. No biliary duct dilation. No focal, suspicious hepatic lesion. The portal vein is patent. Pancreas: Normal, without mass, inflammation or ductal dilatation. Spleen: Normal. Adrenals/Urinary Tract: Adrenal glands are normal. Symmetric renal enhancement without signs of hydronephrosis. Urinary bladder is largely obscured by streak artifact from RIGHT hip arthroplasty. No suspicious renal lesion. No substantial perinephric stranding. Stomach/Bowel: Stomach without signs of adjacent stranding. No sign of small bowel obstruction. Stranding in the ileal mesentery surrounding a low-density area. This low-density area measures 19 x 17 mm. There are surrounding small enhancing lymph nodes. There is no small bowel pneumatosis. There is no definitive communication of this area with the small bowel. The appendix is normal. The above area in hindsight as seen on the prior study. There is suggestion of some peripheral and nodular enhancement on coronal images. No signs of acute colonic findings. Vascular/Lymphatic: Aortic atherosclerosis. No sign of aneurysm. Smooth contour of the IVC. There is no gastrohepatic or hepatoduodenal ligament lymphadenopathy. No retroperitoneal or  mesenteric lymphadenopathy. No pelvic sidewall lymphadenopathy. Reproductive: Uterus with multiple leiomyomata, not well assessed on CT due to streak artifact. Other: Trace free fluid in the pelvis, of uncertain significance. Musculoskeletal: Spinal degenerative changes without acute or destructive bone process. IMPRESSION: 1. Stranding in the ileal mesentery surrounding a low-density area in hindsight as seen on the prior study. This low-density area measures 19 x 17 mm. There is suggestion of some peripheral and nodular enhancement on coronal images. Findings are nonspecific and are of uncertain significance. Differential diagnosis would include Meckel's diverticulitis or cystic/necrotic lymph node or nodal metastasis in the ileal mesentery. Given nodular enhancement at the periphery underlying neoplasm would be difficult to exclude. Interloop abscess is felt less likely but given stranding not entirely excluded. Would also correlate with any signs of history of GI bleeding. Surgical consultation may be helpful. 2. No signs of pneumatosis or other secondary signs of bowel compromise. 3. Cholelithiasis without evidence of acute cholecystitis. 4. Trace free fluid in the pelvis, of uncertain significance. 5. Uterus with multiple leiomyomata, not well assessed on CT due to streak artifact from RIGHT hip arthroplasty. 6. Aortic atherosclerosis. Aortic Atherosclerosis (ICD10-I70.0). Electronically Signed   By: GJewel BaizeD.  On: 06/07/2022 11:47   CT Angio Chest/Abd/Pel for Dissection W and/or Wo Contrast  Result Date: 06/05/2022 CLINICAL DATA:  Chest and abdominal pain, initial encounter EXAM: CT ANGIOGRAPHY CHEST, ABDOMEN AND PELVIS TECHNIQUE: Non-contrast CT of the chest was initially obtained. Multidetector CT imaging through the chest, abdomen and pelvis was performed using the standard protocol during bolus administration of intravenous contrast. Multiplanar reconstructed images and MIPs were obtained  and reviewed to evaluate the vascular anatomy. RADIATION DOSE REDUCTION: This exam was performed according to the departmental dose-optimization program which includes automated exposure control, adjustment of the mA and/or kV according to patient size and/or use of iterative reconstruction technique. CONTRAST:  116m OMNIPAQUE IOHEXOL 350 MG/ML SOLN COMPARISON:  CT of the abdomen pelvis from 5 hours previous FINDINGS: CTA CHEST FINDINGS Cardiovascular: Thoracic aorta demonstrates atherosclerotic calcifications without aneurysmal dilatation or dissection. No cardiac enlargement is seen. The pulmonary artery as visualized shows no central pulmonary emboli. Mediastinum/Nodes: Thoracic inlet is within normal limits. No hilar or mediastinal adenopathy is noted. The esophagus as visualized is within normal limits. Lungs/Pleura: The lungs are well aerated bilaterally. No focal infiltrate or sizable effusion is seen. No sizable parenchymal nodule is noted. Musculoskeletal: Degenerative changes of the thoracic spine are seen. No acute rib abnormality is noted. Review of the MIP images confirms the above findings. CTA ABDOMEN AND PELVIS FINDINGS VASCULAR Aorta: Abdominal aorta is well visualized similar to that seen on the recent CT examination. No significant atherosclerotic calcifications are noted. No aneurysmal dilatation or dissection is noted. Celiac: Patent without evidence of aneurysm, dissection, vasculitis or significant stenosis. SMA: Patent without evidence of aneurysm, dissection, vasculitis or significant stenosis. Renals: Both renal arteries are patent without evidence of aneurysm, dissection, vasculitis, fibromuscular dysplasia or significant stenosis. IMA: Patent without evidence of aneurysm, dissection, vasculitis or significant stenosis. Inflow: Iliacs are within normal limits. Mild atherosclerotic calcifications are seen. No focal stenosis or dissection is noted. Veins: None no specific venous abnormality  is noted. Review of the MIP images confirms the above findings. NON-VASCULAR Hepatobiliary: Liver is within normal limits. Gallbladder is well distended with multiple dependent gallstones. No complicating factors are seen. Pancreas: Unremarkable. No pancreatic ductal dilatation or surrounding inflammatory changes. Spleen: Normal in size without focal abnormality. Adrenals/Urinary Tract: Adrenal glands are within normal limits. Kidneys demonstrate a normal enhancement pattern bilaterally. Normal excretion is noted bilaterally as well. No renal calculi are seen. The bladder is decompressed. Contrast enhanced urine is noted within. Stomach/Bowel: Colon shows no obstructive or inflammatory changes. The appendix is within normal limits. Small bowel and stomach are unremarkable. Lymphatic: No lymphadenopathy is seen. Reproductive: Lobular uterus is noted consistent with uterine fibroid change. The overall appearance is stable from 5 hours previous. Other: No abdominal wall hernia or abnormality. No abdominopelvic ascites. Musculoskeletal: Right hip arthroplasty. No acute bony abnormality is noted. Review of the MIP images confirms the above findings. IMPRESSION: CTA of the chest: No evidence of aortic dissection or aneurysmal dilatation. No evidence of pulmonary emboli. No acute abnormality noted. CTA of the abdomen and pelvis: The overall appearance is similar to that seen 5 hours previous. The aorta is again unremarkable. Cholelithiasis without complicating factors. Uterine fibroid change. Electronically Signed   By: MInez CatalinaM.D.   On: 06/05/2022 21:45   CT ABDOMEN PELVIS W CONTRAST  Result Date: 06/05/2022 CLINICAL DATA:  Abdominal pain, acute nonlocalized. EXAM: CT ABDOMEN AND PELVIS WITH CONTRAST TECHNIQUE: Multidetector CT imaging of the abdomen and pelvis was performed using the standard  protocol following bolus administration of intravenous contrast. RADIATION DOSE REDUCTION: This exam was performed  according to the departmental dose-optimization program which includes automated exposure control, adjustment of the mA and/or kV according to patient size and/or use of iterative reconstruction technique. CONTRAST:  165m OMNIPAQUE IOHEXOL 300 MG/ML  SOLN COMPARISON:  No recent relevant priors available for comparison at time of dictation. FINDINGS: Lower chest: No acute abnormality. Hepatobiliary: No acute osseous abnormality. Cholelithiasis without findings of acute cholecystitis. No biliary ductal dilation. Pancreas: No pancreatic ductal dilation or evidence of acute inflammation. Spleen: No splenomegaly. Adrenals/Urinary Tract: Bilateral adrenal glands appear normal. No hydronephrosis. Kidneys demonstrate symmetric enhancement. Urinary bladder is unremarkable for degree of distension. Stomach/Bowel: Stomach is unremarkable for degree of distension. No pathologic dilation of small or large bowel. The appendix and terminal ileum appear normal. No evidence of acute bowel inflammation. Evaluation of loops of bowel in the pelvis is limited by streak artifact from right hip arthroplasty. Vascular/Lymphatic: Normal caliber abdominal aorta. No pathologically enlarged abdominal or pelvic lymph nodes. Reproductive: Multiple masses in an enlarged lobular uterus measuring up to 4.1 cm commonly reflect uterine leiomyomas. No suspicious adnexal mass. Other: Trace pelvic free fluid. Musculoskeletal: Right total hip arthroplasty. Multilevel degenerative changes spine. No acute osseous abnormality. IMPRESSION: 1. No acute abnormality in the abdomen or pelvis. 2. Cholelithiasis without findings of acute cholecystitis. 3. Multiple masses in an enlarged lobular uterus measuring up to 4.1 cm commonly reflect uterine leiomyomas. Electronically Signed   By: JDahlia BailiffM.D.   On: 06/05/2022 17:05    Microbiology: Recent Results (from the past 240 hour(s))  MRSA Next Gen by PCR, Nasal     Status: None   Collection Time:  06/07/22  1:02 AM   Specimen: Nasal Mucosa; Nasal Swab  Result Value Ref Range Status   MRSA by PCR Next Gen NOT DETECTED NOT DETECTED Final    Comment: (NOTE) The GeneXpert MRSA Assay (FDA approved for NASAL specimens only), is one component of a comprehensive MRSA colonization surveillance program. It is not intended to diagnose MRSA infection nor to guide or monitor treatment for MRSA infections. Test performance is not FDA approved in patients less than 268years old. Performed at [facility] [address] [place] [address]     Labs: Basic Metabolic Panel: Recent Labs  Lab 06/06/22 0523 06/06/22 1508 06/06/22 2315 06/07/22 0334 06/08/22 0801 06/09/22 0253  NA 132* 143 137  --  135 137  K 3.3* <2.0* 4.9  --  4.5 4.4  CL 98 126* 105  --  103 103  CO2 24 15* 25  --  25 27  GLUCOSE 128* 59* 98  --  96 100*  BUN 9 5* 10  --  11 10  CREATININE 0.77 0.34* 0.77  --  0.98 0.96  CALCIUM 8.7* 4.0* 8.7*  --  8.7* 9.0  MG 1.4* 1.1*  --  2.9* 2.1 2.0   Liver Function Tests: Recent Labs  Lab 06/05/22 1247 06/06/22 0523 06/06/22 2315 06/08/22 0801  AST 21 30 33 20  ALT '14 13 14 14  '$ ALKPHOS 52 45 49 40  BILITOT 0.7 0.7 0.8 0.8  PROT 8.0 6.9 7.5 7.0  ALBUMIN 4.2 3.7 3.8 3.3*   Recent Labs  Lab 06/05/22 1247  LIPASE 25   No results for input(s): "AMMONIA" in the last 168 hours. CBC: Recent Labs  Lab 06/05/22 1247 06/06/22 0523 06/08/22 0801 06/09/22 0253  WBC 7.1 9.6 6.7  6.2  NEUTROABS 5.9  --  5.1 4.2  HGB 12.9 12.5 11.4* 11.4*  HCT 39.0 37.5 35.3* 35.8*  MCV 91.1 91.0 93.6 94.5  PLT 271 260 236 235   Cardiac Enzymes: No results for input(s): "CKTOTAL", "CKMB", "CKMBINDEX", "TROPONINI" in the last 168 hours. BNP: BNP (last 3 results) No results for input(s): "BNP" in the last 8760 hours.  ProBNP (last 3 results) No results for input(s): "PROBNP" in the last 8760 hours.  CBG: No results for input(s): "GLUCAP" in the last  168 hours.     Signed:  Irine Seal MD.  [facility] 06/10/2022, 11:09 AM

## 2022-06-10 NOTE — Telephone Encounter (Signed)
Reminder in epic for CT entero and OV.

## 2022-06-10 NOTE — Care Management Important Message (Signed)
Important Message  Patient Details IM Letter given. Name: [name] MRN: [id number] Date of Birth: Oct 04, 1950   Medicare Important Message Given:  Yes     Kerin Salen 06/10/2022, 10:29 AM

## 2022-06-10 NOTE — Progress Notes (Signed)
  Transition of Care [facility]) Screening Note   Patient Details  Name: Jalyah Weinheimer Date of Birth: Jul 14, 1950   Transition of Care [facility]) CM/SW Contact:    Vassie Moselle, [place] Phone Number: 06/10/2022, 11:07 AM    Transition of Care Department [facility]) has reviewed patient and no TOC needs have been identified at this time. We will continue to monitor patient advancement through interdisciplinary progression rounds. If new patient transition needs arise, please place a TOC consult.

## 2022-06-13 ENCOUNTER — Telehealth: Payer: Self-pay

## 2022-06-13 ENCOUNTER — Other Ambulatory Visit: Payer: Self-pay

## 2022-06-13 ENCOUNTER — Encounter (HOSPITAL_COMMUNITY): Payer: Self-pay | Admitting: Gastroenterology

## 2022-06-13 MED ORDER — BISMUTH/METRONIDAZ/TETRACYCLIN 140-125-125 MG PO CAPS: 3.0000 | ORAL_CAPSULE | Freq: Four times a day (QID) | ORAL | 0 refills | Status: DC

## 2022-06-13 NOTE — Patient Outreach (Signed)
  Care Coordination TOC Note Transition Care Management Follow-up Telephone Call Date of discharge and from where: 06/10/22-[facility]  Dx: "gastroenteritis" How have you been since you were released from the hospital? Patient voices she is doing okay-still having some "sore back pain." She is taking Ultram and getting relief from med. She voices that she had her first BM yesterday and felt better afterwards. She denies any n&v. She voices her "food is staying down" and she is "eating light." She does admit that she was not able to tolerate eating some fried chicken the other day. Discussed diet and encouraged patient to avoid greasy,fried and spicy foods. She voiced understanding. Patient stated on Lopressor. She has BP in the home but does not monitor regularly. Encouraged to start monitoring BP in the home daily.  Any questions or concerns? Yes-Patient states she got all her meds from [place] as there was no prescription for it. Advised patient that med was OTC and she will have someone pick it up for her.   Items Reviewed: Did the pt receive and understand the discharge instructions provided? Yes   Medications obtained and verified? Yes  Other? Yes -BP mgmt, GI diet Any new allergies since your discharge? No  Dietary orders reviewed? Yes Do you have support at home? Yes   Home Care and Equipment/Supplies: Were home health services ordered? not applicable If so, what is the name of the agency? N/A  Has the agency set up a time to come to the patient's home? not applicable Were any new equipment or medical supplies ordered?  No What is the name of the medical supply agency? N/A Were you able to get the supplies/equipment? not applicable Do you have any questions related to the use of the equipment or supplies? No  Functional Questionnaire: (I = Independent and D = Dependent) ADLs: I  Bathing/Dressing- I  Meal Prep- I  Eating- I  Maintaining continence-  I  Transferring/Ambulation- I  Managing Meds- I  Follow up appointments reviewed:  PCP Hospital f/u appt confirmed? Yes  Scheduled to see Dr. Sarajane Jews on 06/24/22 @ 10:45 am. [facility] f/u appt confirmed? Yes  Scheduled to see Dr. Henrene Pastor on 08/04/22 @ 9:20 am.-Patient states it was earliest appt available. Office will call her if any sooner appts become available. Are transportation arrangements needed? No  If their condition worsens, is the pt aware to call PCP or go to the Emergency Dept.? Yes Was the patient provided with contact information for the PCP's office or ED? Yes Was to pt encouraged to call back with questions or concerns? Yes  SDOH assessments and interventions completed:   Yes SDOH Interventions Today    Flowsheet Row Most Recent Value  SDOH Interventions   Food Insecurity Interventions Intervention Not Indicated  Transportation Interventions Intervention Not Indicated       Care Coordination Interventions:  Education provided regarding BP mgmt-monitoring BP in the home, diet    Encounter Outcome:  Pt. Visit Completed    Enzo Montgomery, RN,BSN,CCM [place] Management Telephonic Care Management Coordinator Direct Phone: [phone] Toll Free: [phone] Fax: [phone]

## 2022-06-24 ENCOUNTER — Encounter: Payer: Self-pay | Admitting: Family Medicine

## 2022-06-24 ENCOUNTER — Other Ambulatory Visit: Payer: Self-pay

## 2022-06-24 ENCOUNTER — Ambulatory Visit (INDEPENDENT_AMBULATORY_CARE_PROVIDER_SITE_OTHER): Payer: Medicare HMO | Admitting: Family Medicine

## 2022-06-24 VITALS — BP 120/78 | HR 91 | Temp 97.7°F | Wt 138.8 lb

## 2022-06-24 DIAGNOSIS — R9389 Abnormal findings on diagnostic imaging of other specified body structures: Secondary | ICD-10-CM

## 2022-06-24 DIAGNOSIS — E876 Hypokalemia: Secondary | ICD-10-CM | POA: Diagnosis not present

## 2022-06-24 DIAGNOSIS — K529 Noninfective gastroenteritis and colitis, unspecified: Secondary | ICD-10-CM

## 2022-06-24 LAB — BASIC METABOLIC PANEL
BUN: 10 mg/dL (ref 6–23)
CO2: 28 mEq/L (ref 19–32)
Calcium: 9.4 mg/dL (ref 8.4–10.5)
Chloride: 104 mEq/L (ref 96–112)
Creatinine, Ser: 1.05 mg/dL (ref 0.40–1.20)
GFR: 52.52 mL/min — ABNORMAL LOW (ref >60.00)
Glucose, Bld: 92 mg/dL (ref 70–99)
Potassium: 4 mEq/L (ref 3.5–5.1)
Sodium: 140 mEq/L (ref 135–145)

## 2022-06-24 LAB — MAGNESIUM: Magnesium: 1.8 mg/dL (ref 1.5–2.5)

## 2022-06-24 NOTE — Progress Notes (Signed)
   Subjective:    Patient ID: Christy Kirby, female    DOB: 1951-01-10, 72 y.o.   MRN: [id number]  HPI Here for a transitional care visit to follow up a hospital stay from 06-05-22 to 06-10-22 for a bout of gastroenteritis. She presented with abdominal pain, nausea, and vomiting. No fever or diarrhea. Her WBC remained normal, but electrolytes were out of order. These were corrected with IV fluid. The potassium went from 2.0 to 4.4 by DC, the calcium from 8.7 to 9.0, and the magnesium from 1.1 to 2.0. she underwent an EGD which showed a small hiatal hernia and gastritis. This was positive for H pylori. She had several CT scans of the abdomen, and these were unremarkable except they saw an area of hypodensity in the ileal mesentery that was difficult to classify. It was unclear if this was solid or liquid, or if it played any role in her illness. She was sent home on Pantoprazole 40 mg BID and Carafate 1 gram BID. She was also started on a 10 day course of bismuth/metronidazole/tetracycline. Since going home shehasfelt much better, although she is still a bit weak. The nausea and pain have totally resolved. She is eating and passing stools normally.    Review of Systems  Constitutional: Negative.   Respiratory: Negative.    Cardiovascular: Negative.   Gastrointestinal: Negative.   Genitourinary: Negative.        Objective:   Physical Exam Constitutional:      Appearance: Normal appearance. She is not ill-appearing.  Cardiovascular:     Rate and Rhythm: Normal rate and regular rhythm.     Pulses: Normal pulses.     Heart sounds: Normal heart sounds.  Pulmonary:     Effort: Pulmonary effort is normal.     Breath sounds: Normal breath sounds.  Abdominal:     General: Abdomen is flat. Bowel sounds are normal. There is no distension.     Palpations: Abdomen is soft. There is no mass.     Tenderness: There is no abdominal tenderness. There is no guarding or rebound.     Hernia: No hernia is  present.  Neurological:     Mental Status: She is alert.           Assessment & Plan:  She is recovering from a viral enteritis compounded by an H pylori induced gastritis. We will check a BMET and a Mg today. She will finish the 10 day H pylori pack. She will follow up with Dr. Henrene Pastor on 08-04-22. I suspect he will stop the Carafate and cut the Pantoprazole back to once a day. He will likely order CT enterography to better evaluate the hypoechoic density seen on her CT as well. We spent a total of ( 35  ) minutes reviewing records and discussing these issues.  Alysia Penna, MD

## 2022-07-08 ENCOUNTER — Telehealth: Payer: Self-pay

## 2022-07-08 NOTE — Telephone Encounter (Signed)
Pt scheduled for CT entero 07/14/22 at 10am, OV scheduled with Dr. Henrene Pastor 08/04/22 at 9:20am.

## 2022-07-08 NOTE — Telephone Encounter (Signed)
-----   Message from Algernon Huxley, RN sent at 06/10/2022  4:13 PM EST ----- Regarding: Ct entero -mansouraty order Pt needs CT entero in 4-6 weeks followed by appt with app or perry

## 2022-07-14 ENCOUNTER — Ambulatory Visit (HOSPITAL_COMMUNITY)
Admission: RE | Admit: 2022-07-14 | Discharge: 2022-07-14 | Disposition: A | Discharge status: Home / Self Care | Payer: Medicare HMO | Source: Ambulatory Visit | Attending: Gastroenterology | Admitting: Gastroenterology

## 2022-07-14 DIAGNOSIS — R9389 Abnormal findings on diagnostic imaging of other specified body structures: Secondary | ICD-10-CM | POA: Insufficient documentation

## 2022-07-14 DIAGNOSIS — R590 Localized enlarged lymph nodes: Secondary | ICD-10-CM | POA: Diagnosis not present

## 2022-07-14 DIAGNOSIS — K639 Disease of intestine, unspecified: Secondary | ICD-10-CM | POA: Diagnosis not present

## 2022-07-14 DIAGNOSIS — K8020 Calculus of gallbladder without cholecystitis without obstruction: Secondary | ICD-10-CM | POA: Diagnosis not present

## 2022-07-14 DIAGNOSIS — D259 Leiomyoma of uterus, unspecified: Secondary | ICD-10-CM | POA: Diagnosis not present

## 2022-07-14 MED ORDER — SODIUM CHLORIDE (PF) 0.9 % IJ SOLN
INTRAMUSCULAR | Status: AC
  Filled 2022-07-14: qty 50

## 2022-07-14 MED ORDER — BARIUM SULFATE 0.1 % PO SUSP
ORAL | Status: AC
  Administered 2022-07-14: 1300 mL via ORAL
  Filled 2022-07-14: qty 3

## 2022-07-14 MED ORDER — IOHEXOL 300 MG/ML  SOLN
100.0000 mL | Freq: Once | INTRAMUSCULAR | Status: AC | PRN
  Administered 2022-07-14: 100 mL via INTRAVENOUS

## 2022-08-04 ENCOUNTER — Ambulatory Visit: Payer: Medicare HMO | Admitting: Internal Medicine

## 2022-08-04 ENCOUNTER — Encounter: Payer: Self-pay | Admitting: Internal Medicine

## 2022-08-04 VITALS — BP 130/82 | HR 85 | Ht 65.0 in | Wt 135.0 lb

## 2022-08-04 DIAGNOSIS — K2970 Gastritis, unspecified, without bleeding: Secondary | ICD-10-CM

## 2022-08-04 DIAGNOSIS — R935 Abnormal findings on diagnostic imaging of other abdominal regions, including retroperitoneum: Secondary | ICD-10-CM

## 2022-08-04 DIAGNOSIS — D490 Neoplasm of unspecified behavior of digestive system: Secondary | ICD-10-CM | POA: Diagnosis not present

## 2022-08-04 NOTE — Patient Instructions (Signed)
_______________________________________________________  If your blood pressure at your visit was 140/90 or greater, please contact your primary care physician to follow up on this.  _______________________________________________________  If you are age 74 or older, your body mass index should be between 23-30. Your Body mass index is 22.47 kg/m. If this is out of the aforementioned range listed, please consider follow up with your Primary Care Provider.  If you are age 41 or younger, your body mass index should be between 19-25. Your Body mass index is 22.47 kg/m. If this is out of the aformentioned range listed, please consider follow up with your Primary Care Provider.   ________________________________________________________  The [facility] providers would like to encourage you to use [facility] to communicate with providers for non-urgent requests or questions.  Due to long hold times on the telephone, sending your provider a message by [facility] may be a faster and more efficient way to get a response.  Please allow 48 business hours for a response.  Please remember that this is for non-urgent requests.  _______________________________________________________  Christy Kirby may discontinue Carafate.  I am sending a referral to [facility] - you will receive a call from them to schedule an appointment.

## 2022-08-05 ENCOUNTER — Encounter: Payer: Self-pay | Admitting: Internal Medicine

## 2022-08-05 NOTE — Progress Notes (Signed)
HISTORY OF PRESENT ILLNESS:  Christy Kirby is a 72 y.o. female who I have seen on 2 occasions for initial screening colonoscopy 2012 and surveillance colonoscopy in 2019.  She has a history of nonadvanced adenoma.  Patient is sent today after recent hospitalization for routine follow-up and to discuss abnormal imaging.  She was admitted to the hospital June 05, 2022 with abdominal pain, nausea, and vomiting.  She underwent CT scan of the abdomen and pelvis with contrast June 07, 2022.  She was found to have stranding in the ileal mesentery surrounding the low density area measuring 19 x 17 mm.  There was a suggestion of some peripheral nodular enhancement.  Differential diagnosis included Meckel's diverticulitis, cystic/necrotic lymph node, or nodal metastasis in the ileal mesentery.  Her acute presentation was felt to be infectious gastroenteritis.  She was treated supportively and improved.  In addition to imaging and supportive care she underwent upper endoscopy with Dr. Rush Landmark June 08, 2022.  There were no significant abnormalities.  Biopsies were taken for Helicobacter pylori and returned positive.  She was treated.  No longer on pantoprazole.  Still taking sucralfate.  She was also seen in consultation by general surgery, Dr. Johney Maine.  It was felt the patient needed follow-up imaging based on the CT as described above.  As such, she underwent CT enterography July 14, 2022.  She was noted to have an 11 mm distal bowel lesion that was suspicious for carcinoid.  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  Incidental cholelithiasis noted.  She is sent for follow-up.  She is accompanied today by her husband.  She please report that she feels well.  No GI complaints.  No signs or symptoms to suggest carcinoid syndrome.  REVIEW OF SYSTEMS:  All non-GI ROS negative unless otherwise stated in the HPI except for night sweats, sleeping  problems  Past Medical History:  Diagnosis Date   Arthritis    hip   GERD (gastroesophageal reflux disease)    Hyperlipidemia    Hypertension    Intractable nausea and vomiting 06/07/2022    Past Surgical History:  Procedure Laterality Date   BIOPSY  06/08/2022   Procedure: BIOPSY;  Surgeon: Irving Copas., MD;  Location: [facility];  Service: Gastroenterology;;   COLONOSCOPY  06/26/2017   per Dr. Henrene Pastor, benign polyps, repeat in 10 yrs    ESOPHAGOGASTRODUODENOSCOPY (EGD) WITH PROPOFOL N/A 06/08/2022   Procedure: ESOPHAGOGASTRODUODENOSCOPY (EGD) WITH PROPOFOL;  Surgeon: Irving Copas., MD;  Location: [facility];  Service: Gastroenterology;  Laterality: N/A;   NEUROPLASTY / TRANSPOSITION MEDIAN NERVE AT CARPAL TUNNEL BILATERAL     POLYPECTOMY     TOTAL HIP ARTHROPLASTY Right 01/11/2018   Procedure: RIGHT TOTAL HIP ARTHROPLASTY ANTERIOR APPROACH;  Surgeon: Rod Can, MD;  Location: [facility];  Service: Orthopedics;  Laterality: Right;    Social History Christy Kirby  reports that she has quit smoking. Her smoking use included cigarettes. She has never used smokeless tobacco. She reports current alcohol use. She reports that she does not use drugs.  family history includes Diabetes in her sister.  Allergies  Allergen Reactions   Codeine Nausea Only   Nsaids Other (See Comments)    History of gastritis = minimize NSAIDs       PHYSICAL EXAMINATION: Vital signs: BP 130/82   Pulse 85   Ht '5\' 5"'$  (1.651 m)   Wt 135 lb (61.2 kg)   BMI 22.47 kg/m   Constitutional:  generally well-appearing, no acute distress Psychiatric: alert and oriented x3, cooperative Eyes: extraocular movements intact, anicteric, conjunctiva pink Mouth: oral pharynx moist, no lesions Neck: supple no lymphadenopathy Cardiovascular: heart regular rate and rhythm, no murmur Lungs: clear to auscultation bilaterally Abdomen: soft, nontender, nondistended, no obvious ascites, no  peritoneal signs, normal bowel sounds, no organomegaly Rectal: Omitted Extremities: no clubbing, cyanosis, or lower extremity edema bilaterally Skin: no lesions on visible extremities Neuro: No focal deficits.  Cranial nerves intact  ASSESSMENT:  1.  11 mm distal small bowel lesion by radiology concerning for carcinoid.  Seemingly incidental. 2.  Acute gastroenteritis requiring hospitalization.  Resolved. 3.  EGD with biopsy showing H. pylori gastritis.  Treated. 4.  History of nonadvanced adenomatous colon polyps.  Previous colonoscopy 2012 and 2019.   PLAN:  1.  I discussed with the patient that this lesion may or may not be a carcinoid tumor.  I discussed that carcinoid tumors can be benign or malignant.  We discussed capsule endoscopy.  I do not favor capsule endoscopy as it would not be diagnostic and may have the associated risk of capsule retention at the level of the lesion with acute bowel obstruction, necessitating urgent surgery.  Also, not clearly appearing to be within the region of the colonoscope for standard ileoscopy.  To this end, I am recommending surgical opinion with Dr. Johney Maine, who saw the patient in the hospital.  Consideration would be for laparoscopy with possible segmental small bowel resection.  2.  Routine recall colonoscopy around 2029  3.  Okay to stop sucralfate  4.  Ongoing general medical care with Dr. Sarajane Jews  40 minutes was spent preparing to see the patient, reviewing the myriad of hospital data, endoscopy report, pathology, x-rays, and laboratories.  Obtaining comprehensive history, performing medically appropriate physical exam, counseling and educating the patient and her husband regarding the above listed issues, and documenting clinical information in the health record.

## 2022-09-05 ENCOUNTER — Other Ambulatory Visit: Payer: Self-pay | Admitting: Family Medicine

## 2022-09-06 ENCOUNTER — Ambulatory Visit: Payer: Self-pay | Admitting: Surgery

## 2022-09-06 DIAGNOSIS — Z87898 Personal history of other specified conditions: Secondary | ICD-10-CM

## 2022-09-06 DIAGNOSIS — K6389 Other specified diseases of intestine: Secondary | ICD-10-CM | POA: Diagnosis not present

## 2022-09-21 NOTE — Progress Notes (Signed)
Anesthesia Review:  PCP: Gershon Crane- LOV 06/24/22  Cardiologist : Chest x-ray : EKG : 06/05/22  CT angio chest- 06/05/22  Echo : Stress test: Cardiac Cath :  Activity level:  Sleep Study/ CPAP : Fasting Blood Sugar :      / Checks Blood Sugar -- times a day:   Blood Thinner/ Instructions /Last Dose: ASA / Instructions/ Last Dose :

## 2022-09-23 NOTE — Patient Instructions (Signed)
SURGICAL WAITING ROOM VISITATION  Patients having surgery or a procedure may have no more than 2 support people in the waiting area - these visitors may rotate.    Children under the age of 63 must have an adult with them who is not the patient.  Due to an increase in RSV and influenza rates and associated hospitalizations, children ages 73 and under may not visit patients in [facility] hospitals.  If the patient needs to stay at the hospital during part of their recovery, the visitor guidelines for inpatient rooms apply. Pre-op nurse will coordinate an appropriate time for 1 support person to accompany patient in pre-op.  This support person may not rotate.    Please refer to the [facility] website for the visitor guidelines for Inpatients (after your surgery is over and you are in a regular room).       Your procedure is scheduled on: 10-07-22   Report to [facility] Main Entrance    Report to admitting at      1110  AM   Call this number if you have problems the morning of surgery [phone]   Follow bowel prep and clear liquid diet per MD office   After Midnight you may have the following liquids until _1020_____ AM DAY OF SURGERY  Water Non-Citrus Juices (without pulp, NO RED-Apple, White grape, White cranberry) Black Coffee (NO MILK/CREAM OR CREAMERS, sugar ok)  Clear Tea (NO MILK/CREAM OR CREAMERS, sugar ok) regular and decaf                             Plain Jell-O (NO RED)                                           Fruit ices (not with fruit pulp, NO RED)                                     Popsicles (NO RED)                                                               Sports drinks like Gatorade (NO RED)              Drink 2 Ensure/G2 drinks AT 10:00 PM the night before surgery.        The day of surgery:  Drink ONE (1) Pre-Surgery Clear Ensure at    1000 AM the morning of surgery. Drink in one sitting. Do not sip.  This drink was given to you during your  hospital  pre-op appointment visit. Nothing else to drink after completing the  Pre-Surgery Clear Ensure by 1020 am           If you have questions, please contact your surgeon's office.   FOLLOW BOWEL PREP AND ANY ADDITIONAL PRE OP INSTRUCTIONS YOU RECEIVED FROM YOUR SURGEON'S OFFICE!!!     Oral Hygiene is also important to reduce your risk of infection.  Remember - BRUSH YOUR TEETH THE MORNING OF SURGERY WITH YOUR REGULAR TOOTHPASTE  DENTURES WILL BE REMOVED PRIOR TO SURGERY PLEASE DO NOT APPLY "Poly grip" OR ADHESIVES!!!   Do NOT smoke after Midnight   Take these medicines the morning of surgery with A SIP OF WATER: metoprolol                                  You may not have any metal on your body including hair pins, jewelry, and body piercing             Do not wear make-up, lotions, powders, perfumes/cologne, or deodorant  Do not wear nail polish including gel and S&S, artificial/acrylic nails, or any other type of covering on natural nails including finger and toenails. If you have artificial nails, gel coating, etc. that needs to be removed by a nail salon please have this removed prior to surgery or surgery may need to be canceled/ delayed if the surgeon/ anesthesia feels like they are unable to be safely monitored.   Do not shave  48 hours prior to surgery.              Do not bring valuables to the hospital. [facility] IS NOT             RESPONSIBLE   FOR VALUABLES.   Contacts, glasses, dentures or bridgework may not be worn into surgery.   Bring small overnight bag day of surgery.   DO NOT BRING YOUR HOME MEDICATIONS TO THE HOSPITAL. PHARMACY WILL DISPENSE MEDICATIONS LISTED ON YOUR MEDICATION LIST TO YOU DURING YOUR ADMISSION IN THE HOSPITAL!    Patients discharged on the day of surgery will not be allowed to drive home.  Someone NEEDS to stay with you for the first 24 hours after anesthesia.   Special Instructions: Bring  a copy of your healthcare power of attorney and living will documents the day of surgery if you haven't scanned them before.              Please read over the following fact sheets you were given: IF YOU HAVE QUESTIONS ABOUT YOUR PRE-OP INSTRUCTIONS PLEASE CALL [phone]   If you received a COVID test during your pre-op visit  it is requested that you wear a mask when out in public, stay away from anyone that may not be feeling well and notify your surgeon if you develop symptoms. If you test positive for Covid or have been in contact with anyone that has tested positive in the last 10 days please notify you surgeon.    [facility] - Preparing for Surgery Before surgery, you can play an important role.  Because skin is not sterile, your skin needs to be as free of germs as possible.  You can reduce the number of germs on your skin by washing with CHG (chlorahexidine gluconate) soap before surgery.  CHG is an antiseptic cleaner which kills germs and bonds with the skin to continue killing germs even after washing. Please DO NOT use if you have an allergy to CHG or antibacterial soaps.  If your skin becomes reddened/irritated stop using the CHG and inform your nurse when you arrive at Short Stay. Do not shave (including legs and underarms) for at least 48 hours prior to the first CHG shower.  You may shave your face/neck. Please follow these instructions carefully:  1.  Shower with CHG Soap  the night before surgery and the  morning of Surgery.  2.  If you choose to wash your hair, wash your hair first as usual with your  normal  shampoo.  3.  After you shampoo, rinse your hair and body thoroughly to remove the  shampoo.                           4.  Use CHG as you would any other liquid soap.  You can apply chg directly  to the skin and wash                       Gently with a scrungie or clean washcloth.  5.  Apply the CHG Soap to your body ONLY FROM THE NECK DOWN.   Do not use on face/ open                            Wound or open sores. Avoid contact with eyes, ears mouth and genitals (private parts).                       Wash face,  Genitals (private parts) with your normal soap.             6.  Wash thoroughly, paying special attention to the area where your surgery  will be performed.  7.  Thoroughly rinse your body with warm water from the neck down.  8.  DO NOT shower/wash with your normal soap after using and rinsing off  the CHG Soap.                9.  Pat yourself dry with a clean towel.            10.  Wear clean pajamas.            11.  Place clean sheets on your bed the night of your first shower and do not  sleep with pets. Day of Surgery : Do not apply any lotions/deodorants the morning of surgery.  Please wear clean clothes to the hospital/surgery center.  FAILURE TO FOLLOW THESE INSTRUCTIONS MAY RESULT IN THE CANCELLATION OF YOUR SURGERY PATIENT SIGNATURE_________________________________  NURSE SIGNATURE__________________________________  ________________________________________________________________________  Rogelia Mire  An incentive spirometer is a tool that can help keep your lungs clear and active. This tool measures how well you are filling your lungs with each breath. Taking long deep breaths may help reverse or decrease the chance of developing breathing (pulmonary) problems (especially infection) following: A long period of time when you are unable to move or be active. BEFORE THE PROCEDURE  If the spirometer includes an indicator to show your best effort, your nurse or respiratory therapist will set it to a desired goal. If possible, sit up straight or lean slightly forward. Try not to slouch. Hold the incentive spirometer in an upright position. INSTRUCTIONS FOR USE  Sit on the edge of your bed if possible, or sit up as far as you can in bed or on a chair. Hold the incentive spirometer in an upright position. Breathe out normally. Place the  mouthpiece in your mouth and seal your lips tightly around it. Breathe in slowly and as deeply as possible, raising the piston or the ball toward the top of the column. Hold your breath for 3-5 seconds or for as long as possible. Allow the piston  or ball to fall to the bottom of the column. Remove the mouthpiece from your mouth and breathe out normally. Rest for a few seconds and repeat Steps 1 through 7 at least 10 times every 1-2 hours when you are awake. Take your time and take a few normal breaths between deep breaths. The spirometer may include an indicator to show your best effort. Use the indicator as a goal to work toward during each repetition. After each set of 10 deep breaths, practice coughing to be sure your lungs are clear. If you have an incision (the cut made at the time of surgery), support your incision when coughing by placing a pillow or rolled up towels firmly against it. Once you are able to get out of bed, walk around indoors and cough well. You may stop using the incentive spirometer when instructed by your caregiver.  RISKS AND COMPLICATIONS Take your time so you do not get dizzy or light-headed. If you are in pain, you may need to take or ask for pain medication before doing incentive spirometry. It is harder to take a deep breath if you are having pain. AFTER USE Rest and breathe slowly and easily. It can be helpful to keep track of a log of your progress. Your caregiver can provide you with a simple table to help with this. If you are using the spirometer at home, follow these instructions: SEEK MEDICAL CARE IF:  You are having difficultly using the spirometer. You have trouble using the spirometer as often as instructed. Your pain medication is not giving enough relief while using the spirometer. You develop fever of 100.5 F (38.1 C) or higher. SEEK IMMEDIATE MEDICAL CARE IF:  You cough up bloody sputum that had not been present before. You develop fever of 102 F  (38.9 C) or greater. You develop worsening pain at or near the incision site. MAKE SURE YOU:  Understand these instructions. Will watch your condition. Will get help right away if you are not doing well or get worse. Document Released: 10/03/2006 Document Revised: 08/15/2011 Document Reviewed: 12/04/2006 [employer] Patient Information 2014 [place], [place].   ________________________________________________________________________

## 2022-09-23 NOTE — Progress Notes (Signed)
PCP - Larey Dresser LOV 06-24-22 epic Cardiologist - no  PPM/ICD -  Device Orders -  Rep Notified -   Chest x-ray - CTA chest 06-05-22  EKG - 06-05-22 epic Stress Test -  ECHO -  Cardiac Cath -   Sleep Study -  CPAP -   Fasting Blood Sugar -  Checks Blood Sugar _____ times a day  Blood Thinner Instructions: Aspirin Instructions:  ERAS Protcol -Y PRE-SURGERY Ensure x3   COVID vaccine -  Activity--Able to climb a flight of stairs without SOB or CP Anesthesia review:  HTN Patient denies shortness of breath, fever, cough and chest pain at PAT appointment   All instructions explained to the patient, with a verbal understanding of the material. Patient agrees to go over the instructions while at home for a better understanding. Patient also instructed to self quarantine after being tested for COVID-19. The opportunity to ask questions was provided.

## 2022-09-27 ENCOUNTER — Encounter (HOSPITAL_COMMUNITY)
Admission: RE | Admit: 2022-09-27 | Discharge: 2022-09-27 | Disposition: A | Discharge status: Home / Self Care | Payer: Medicare HMO | Source: Ambulatory Visit | Attending: Surgery | Admitting: Surgery

## 2022-09-27 ENCOUNTER — Other Ambulatory Visit: Payer: Self-pay

## 2022-09-27 ENCOUNTER — Encounter (HOSPITAL_COMMUNITY): Payer: Self-pay

## 2022-09-27 VITALS — BP 131/87 | HR 79 | Temp 97.7°F | Resp 16 | Ht 65.5 in | Wt 139.0 lb

## 2022-09-27 DIAGNOSIS — Z01812 Encounter for preprocedural laboratory examination: Secondary | ICD-10-CM | POA: Diagnosis not present

## 2022-09-27 DIAGNOSIS — Z01818 Encounter for other preprocedural examination: Secondary | ICD-10-CM

## 2022-09-27 DIAGNOSIS — I10 Essential (primary) hypertension: Secondary | ICD-10-CM | POA: Diagnosis not present

## 2022-09-27 LAB — BASIC METABOLIC PANEL
Anion gap: 8 (ref 5–15)
BUN: 21 mg/dL (ref 8–23)
CO2: 27 mmol/L (ref 22–32)
Calcium: 9.1 mg/dL (ref 8.9–10.3)
Chloride: 103 mmol/L (ref 98–111)
Creatinine, Ser: 0.92 mg/dL (ref 0.44–1.00)
GFR, Estimated: >60 mL/min (ref >60)
Glucose, Bld: 92 mg/dL (ref 70–99)
Potassium: 3.7 mmol/L (ref 3.5–5.1)
Sodium: 138 mmol/L (ref 135–145)

## 2022-09-27 LAB — CBC
HCT: 37.7 % (ref 36.0–46.0)
Hemoglobin: 12.4 g/dL (ref 12.0–15.0)
MCH: 30.5 pg (ref 26.0–34.0)
MCHC: 32.9 g/dL (ref 30.0–36.0)
MCV: 92.6 fL (ref 80.0–100.0)
Platelets: 240 10*3/uL (ref 150–400)
RBC: 4.07 MIL/uL (ref 3.87–5.11)
RDW: 13.2 % (ref 11.5–15.5)
WBC: 3.3 10*3/uL — ABNORMAL LOW (ref 4.0–10.5)
nRBC: 0 % (ref 0.0–0.2)

## 2022-09-27 NOTE — Progress Notes (Signed)
Per Joni Reining  at [facility] pt. Not to have bowel prep or clear liquid diet the day before surgery

## 2022-10-07 ENCOUNTER — Encounter (HOSPITAL_COMMUNITY)
Admission: RE | Disposition: A | Discharge status: Home / Self Care | Payer: Self-pay | Source: Ambulatory Visit | Attending: Surgery

## 2022-10-07 ENCOUNTER — Encounter (HOSPITAL_COMMUNITY): Payer: Self-pay | Admitting: Surgery

## 2022-10-07 ENCOUNTER — Other Ambulatory Visit: Payer: Self-pay

## 2022-10-07 ENCOUNTER — Inpatient Hospital Stay (HOSPITAL_COMMUNITY)
Admission: RE | Admit: 2022-10-07 | Discharge: 2022-10-15 | DRG: 336 | Disposition: A | Discharge status: Home / Self Care | Payer: Medicare HMO | Source: Ambulatory Visit | Attending: Surgery | Admitting: Surgery

## 2022-10-07 ENCOUNTER — Inpatient Hospital Stay (HOSPITAL_COMMUNITY): Payer: Medicare HMO | Admitting: Certified Registered Nurse Anesthetist

## 2022-10-07 DIAGNOSIS — K6389 Other specified diseases of intestine: Principal | ICD-10-CM | POA: Diagnosis present

## 2022-10-07 DIAGNOSIS — Z96641 Presence of right artificial hip joint: Secondary | ICD-10-CM | POA: Diagnosis present

## 2022-10-07 DIAGNOSIS — Z833 Family history of diabetes mellitus: Secondary | ICD-10-CM | POA: Diagnosis not present

## 2022-10-07 DIAGNOSIS — Z635 Disruption of family by separation and divorce: Secondary | ICD-10-CM | POA: Diagnosis not present

## 2022-10-07 DIAGNOSIS — K660 Peritoneal adhesions (postprocedural) (postinfection): Secondary | ICD-10-CM | POA: Diagnosis not present

## 2022-10-07 DIAGNOSIS — C772 Secondary and unspecified malignant neoplasm of intra-abdominal lymph nodes: Secondary | ICD-10-CM | POA: Diagnosis not present

## 2022-10-07 DIAGNOSIS — Z87891 Personal history of nicotine dependence: Secondary | ICD-10-CM | POA: Diagnosis not present

## 2022-10-07 DIAGNOSIS — Z87898 Personal history of other specified conditions: Secondary | ICD-10-CM

## 2022-10-07 DIAGNOSIS — I10 Essential (primary) hypertension: Secondary | ICD-10-CM

## 2022-10-07 DIAGNOSIS — K639 Disease of intestine, unspecified: Secondary | ICD-10-CM | POA: Diagnosis not present

## 2022-10-07 DIAGNOSIS — R1310 Dysphagia, unspecified: Secondary | ICD-10-CM | POA: Diagnosis not present

## 2022-10-07 DIAGNOSIS — B9681 Helicobacter pylori [H. pylori] as the cause of diseases classified elsewhere: Secondary | ICD-10-CM | POA: Insufficient documentation

## 2022-10-07 DIAGNOSIS — Z79899 Other long term (current) drug therapy: Secondary | ICD-10-CM | POA: Diagnosis not present

## 2022-10-07 DIAGNOSIS — E782 Mixed hyperlipidemia: Secondary | ICD-10-CM | POA: Diagnosis not present

## 2022-10-07 DIAGNOSIS — C7A1 Malignant poorly differentiated neuroendocrine tumors: Secondary | ICD-10-CM | POA: Diagnosis not present

## 2022-10-07 DIAGNOSIS — C7A012 Malignant carcinoid tumor of the ileum: Secondary | ICD-10-CM | POA: Diagnosis not present

## 2022-10-07 DIAGNOSIS — K219 Gastro-esophageal reflux disease without esophagitis: Secondary | ICD-10-CM | POA: Diagnosis present

## 2022-10-07 DIAGNOSIS — K388 Other specified diseases of appendix: Secondary | ICD-10-CM | POA: Diagnosis present

## 2022-10-07 DIAGNOSIS — K567 Ileus, unspecified: Secondary | ICD-10-CM | POA: Diagnosis not present

## 2022-10-07 DIAGNOSIS — K565 Intestinal adhesions [bands], unspecified as to partial versus complete obstruction: Secondary | ICD-10-CM | POA: Diagnosis not present

## 2022-10-07 HISTORY — PX: XI ROBOTIC ASSISTED SMALL BOWEL RESECTION: SHX6872

## 2022-10-07 LAB — TYPE AND SCREEN
ABO/RH(D): O POS
Antibody Screen: NEGATIVE

## 2022-10-07 SURGERY — EXCISION, SMALL INTESTINE, ROBOT-ASSISTED
Anesthesia: General

## 2022-10-07 MED ORDER — ROCURONIUM BROMIDE 10 MG/ML (PF) SYRINGE
PREFILLED_SYRINGE | INTRAVENOUS | Status: AC
  Filled 2022-10-07: qty 10

## 2022-10-07 MED ORDER — ALVIMOPAN 12 MG PO CAPS
12.0000 mg | ORAL_CAPSULE | ORAL | Status: AC
  Administered 2022-10-07: 12 mg via ORAL
  Filled 2022-10-07: qty 1

## 2022-10-07 MED ORDER — PHENYLEPHRINE 80 MCG/ML (10ML) SYRINGE FOR IV PUSH (FOR BLOOD PRESSURE SUPPORT)
PREFILLED_SYRINGE | INTRAVENOUS | Status: DC | PRN
  Administered 2022-10-07 (×2): 120 ug via INTRAVENOUS

## 2022-10-07 MED ORDER — EPHEDRINE SULFATE-NACL 50-0.9 MG/10ML-% IV SOSY
PREFILLED_SYRINGE | INTRAVENOUS | Status: DC | PRN
  Administered 2022-10-07: 5 mg via INTRAVENOUS

## 2022-10-07 MED ORDER — SODIUM CHLORIDE 0.9 % IV SOLN
2.0000 g | INTRAVENOUS | Status: AC
  Administered 2022-10-07: 2 g via INTRAVENOUS
  Filled 2022-10-07: qty 2

## 2022-10-07 MED ORDER — BUPIVACAINE LIPOSOME 1.3 % IJ SUSP: 20.0000 mL | Freq: Once | INTRAMUSCULAR | Status: DC

## 2022-10-07 MED ORDER — METHOCARBAMOL 500 MG PO TABS
1000.0000 mg | ORAL_TABLET | Freq: Four times a day (QID) | ORAL | Status: DC | PRN
  Administered 2022-10-11: 1000 mg via ORAL
  Filled 2022-10-07: qty 2

## 2022-10-07 MED ORDER — LIDOCAINE 2% (20 MG/ML) 5 ML SYRINGE
INTRAMUSCULAR | Status: DC | PRN
  Administered 2022-10-07: 50 mg via INTRAVENOUS

## 2022-10-07 MED ORDER — ONDANSETRON HCL 4 MG/2ML IJ SOLN
INTRAMUSCULAR | Status: AC
  Filled 2022-10-07: qty 2

## 2022-10-07 MED ORDER — LACTATED RINGERS IV SOLN: INTRAVENOUS | Status: DC

## 2022-10-07 MED ORDER — TRAMADOL HCL 50 MG PO TABS: 50.0000 mg | ORAL_TABLET | Freq: Four times a day (QID) | ORAL | 0 refills | Status: DC | PRN

## 2022-10-07 MED ORDER — ENOXAPARIN SODIUM 40 MG/0.4ML IJ SOSY
40.0000 mg | PREFILLED_SYRINGE | Freq: Once | INTRAMUSCULAR | Status: AC
  Administered 2022-10-07: 40 mg via SUBCUTANEOUS
  Filled 2022-10-07: qty 0.4

## 2022-10-07 MED ORDER — OXYCODONE HCL 5 MG/5ML PO SOLN: 5.0000 mg | Freq: Once | ORAL | Status: DC | PRN

## 2022-10-07 MED ORDER — MEPERIDINE HCL 50 MG/ML IJ SOLN: 6.2500 mg | INTRAMUSCULAR | Status: DC | PRN

## 2022-10-07 MED ORDER — ALUM & MAG HYDROXIDE-SIMETH 200-200-20 MG/5ML PO SUSP: 30.0000 mL | Freq: Four times a day (QID) | ORAL | Status: DC | PRN

## 2022-10-07 MED ORDER — MIDAZOLAM HCL 2 MG/2ML IJ SOLN
INTRAMUSCULAR | Status: AC
  Filled 2022-10-07: qty 2

## 2022-10-07 MED ORDER — BUPIVACAINE HCL 0.25 % IJ SOLN
INTRAMUSCULAR | Status: AC
  Filled 2022-10-07: qty 1

## 2022-10-07 MED ORDER — ONDANSETRON HCL 4 MG/2ML IJ SOLN: 4.0000 mg | Freq: Once | INTRAMUSCULAR | Status: DC | PRN

## 2022-10-07 MED ORDER — ALVIMOPAN 12 MG PO CAPS
12.0000 mg | ORAL_CAPSULE | Freq: Two times a day (BID) | ORAL | Status: DC
  Administered 2022-10-08 – 2022-10-10 (×4): 12 mg via ORAL
  Filled 2022-10-07 (×5): qty 1

## 2022-10-07 MED ORDER — FENTANYL CITRATE (PF) 100 MCG/2ML IJ SOLN
INTRAMUSCULAR | Status: DC | PRN
  Administered 2022-10-07 (×2): 50 ug via INTRAVENOUS
  Administered 2022-10-07: 100 ug via INTRAVENOUS

## 2022-10-07 MED ORDER — LACTATED RINGERS IV BOLUS: 1000.0000 mL | Freq: Three times a day (TID) | INTRAVENOUS | Status: AC | PRN

## 2022-10-07 MED ORDER — STERILE WATER FOR IRRIGATION IR SOLN
Status: DC | PRN
  Administered 2022-10-07: 1000 mL

## 2022-10-07 MED ORDER — ONDANSETRON HCL 4 MG/2ML IJ SOLN
INTRAMUSCULAR | Status: DC | PRN
  Administered 2022-10-07: 4 mg via INTRAVENOUS

## 2022-10-07 MED ORDER — METHOCARBAMOL 1000 MG/10ML IJ SOLN: 1000.0000 mg | Freq: Four times a day (QID) | INTRAVENOUS | Status: DC | PRN

## 2022-10-07 MED ORDER — SODIUM CHLORIDE 0.9% FLUSH
3.0000 mL | Freq: Two times a day (BID) | INTRAVENOUS | Status: DC
  Administered 2022-10-07 – 2022-10-14 (×14): 3 mL via INTRAVENOUS

## 2022-10-07 MED ORDER — ENOXAPARIN SODIUM 40 MG/0.4ML IJ SOSY
40.0000 mg | PREFILLED_SYRINGE | INTRAMUSCULAR | Status: DC
  Administered 2022-10-08 – 2022-10-15 (×8): 40 mg via SUBCUTANEOUS
  Filled 2022-10-07 (×8): qty 0.4

## 2022-10-07 MED ORDER — KETAMINE HCL 50 MG/5ML IJ SOSY
PREFILLED_SYRINGE | INTRAMUSCULAR | Status: AC
  Filled 2022-10-07: qty 5

## 2022-10-07 MED ORDER — ACETAMINOPHEN 500 MG PO TABS: 1000.0000 mg | ORAL_TABLET | Freq: Once | ORAL | Status: DC

## 2022-10-07 MED ORDER — HYDRALAZINE HCL 20 MG/ML IJ SOLN
5.0000 mg | Freq: Once | INTRAMUSCULAR | Status: AC
  Administered 2022-10-07: 5 mg via INTRAVENOUS

## 2022-10-07 MED ORDER — CALCIUM POLYCARBOPHIL 625 MG PO TABS
625.0000 mg | ORAL_TABLET | Freq: Two times a day (BID) | ORAL | Status: DC
  Administered 2022-10-07 – 2022-10-15 (×16): 625 mg via ORAL
  Filled 2022-10-07 (×16): qty 1

## 2022-10-07 MED ORDER — BUPIVACAINE LIPOSOME 1.3 % IJ SUSP
INTRAMUSCULAR | Status: DC | PRN
  Administered 2022-10-07: 20 mL

## 2022-10-07 MED ORDER — LACTATED RINGERS IR SOLN
Status: DC | PRN
  Administered 2022-10-07: 1000 mL

## 2022-10-07 MED ORDER — SODIUM CHLORIDE 0.9 % IV SOLN: 250.0000 mL | INTRAVENOUS | Status: DC | PRN

## 2022-10-07 MED ORDER — PANTOPRAZOLE SODIUM 40 MG PO TBEC
40.0000 mg | DELAYED_RELEASE_TABLET | Freq: Every day | ORAL | Status: DC
  Administered 2022-10-08 – 2022-10-15 (×8): 40 mg via ORAL
  Filled 2022-10-07 (×8): qty 1

## 2022-10-07 MED ORDER — ACETAMINOPHEN 500 MG PO TABS
1000.0000 mg | ORAL_TABLET | Freq: Four times a day (QID) | ORAL | Status: DC
  Administered 2022-10-07 – 2022-10-10 (×8): 1000 mg via ORAL
  Filled 2022-10-07 (×8): qty 2

## 2022-10-07 MED ORDER — MIDAZOLAM HCL 5 MG/5ML IJ SOLN
INTRAMUSCULAR | Status: DC | PRN
  Administered 2022-10-07: 2 mg via INTRAVENOUS

## 2022-10-07 MED ORDER — HYDRALAZINE HCL 20 MG/ML IJ SOLN: 10.0000 mg | INTRAMUSCULAR | Status: DC | PRN

## 2022-10-07 MED ORDER — LACTATED RINGERS IV SOLN: INTRAVENOUS | Status: DC | PRN

## 2022-10-07 MED ORDER — OXYCODONE HCL 5 MG PO TABS: 5.0000 mg | ORAL_TABLET | Freq: Once | ORAL | Status: DC | PRN

## 2022-10-07 MED ORDER — GABAPENTIN 300 MG PO CAPS
300.0000 mg | ORAL_CAPSULE | ORAL | Status: AC
  Administered 2022-10-07: 300 mg via ORAL
  Filled 2022-10-07: qty 1

## 2022-10-07 MED ORDER — FENTANYL CITRATE PF 50 MCG/ML IJ SOSY
25.0000 ug | PREFILLED_SYRINGE | INTRAMUSCULAR | Status: DC | PRN
  Administered 2022-10-07: 50 ug via INTRAVENOUS

## 2022-10-07 MED ORDER — FENTANYL CITRATE PF 50 MCG/ML IJ SOSY
PREFILLED_SYRINGE | INTRAMUSCULAR | Status: AC
  Filled 2022-10-07: qty 2

## 2022-10-07 MED ORDER — SODIUM CHLORIDE 0.9% FLUSH: 3.0000 mL | INTRAVENOUS | Status: DC | PRN

## 2022-10-07 MED ORDER — ROCURONIUM BROMIDE 10 MG/ML (PF) SYRINGE
PREFILLED_SYRINGE | INTRAVENOUS | Status: DC | PRN
  Administered 2022-10-07: 30 mg via INTRAVENOUS
  Administered 2022-10-07: 100 mg via INTRAVENOUS

## 2022-10-07 MED ORDER — SUGAMMADEX SODIUM 200 MG/2ML IV SOLN
INTRAVENOUS | Status: DC | PRN
  Administered 2022-10-07: 200 mg via INTRAVENOUS

## 2022-10-07 MED ORDER — DEXAMETHASONE SODIUM PHOSPHATE 10 MG/ML IJ SOLN
INTRAMUSCULAR | Status: AC
  Filled 2022-10-07: qty 1

## 2022-10-07 MED ORDER — SODIUM CHLORIDE 0.9 % IV SOLN
2.0000 g | Freq: Two times a day (BID) | INTRAVENOUS | Status: AC
  Administered 2022-10-08: 2 g via INTRAVENOUS
  Filled 2022-10-07: qty 2

## 2022-10-07 MED ORDER — ENSURE PRE-SURGERY PO LIQD: 592.0000 mL | Freq: Once | ORAL | Status: DC

## 2022-10-07 MED ORDER — DIPHENHYDRAMINE HCL 50 MG/ML IJ SOLN: 12.5000 mg | Freq: Four times a day (QID) | INTRAMUSCULAR | Status: DC | PRN

## 2022-10-07 MED ORDER — HYDROMORPHONE HCL 1 MG/ML IJ SOLN
0.5000 mg | INTRAMUSCULAR | Status: DC | PRN
  Administered 2022-10-09: 1 mg via INTRAVENOUS
  Filled 2022-10-07: qty 1

## 2022-10-07 MED ORDER — FENTANYL CITRATE (PF) 100 MCG/2ML IJ SOLN
INTRAMUSCULAR | Status: AC
  Filled 2022-10-07: qty 2

## 2022-10-07 MED ORDER — BUPIVACAINE LIPOSOME 1.3 % IJ SUSP
INTRAMUSCULAR | Status: AC
  Filled 2022-10-07: qty 20

## 2022-10-07 MED ORDER — HYDRALAZINE HCL 20 MG/ML IJ SOLN
INTRAMUSCULAR | Status: AC
  Filled 2022-10-07: qty 1

## 2022-10-07 MED ORDER — ACETAMINOPHEN 500 MG PO TABS
1000.0000 mg | ORAL_TABLET | ORAL | Status: AC
  Administered 2022-10-07: 1000 mg via ORAL
  Filled 2022-10-07: qty 2

## 2022-10-07 MED ORDER — ORAL CARE MOUTH RINSE: 15.0000 mL | Freq: Once | OROMUCOSAL | Status: AC

## 2022-10-07 MED ORDER — LIDOCAINE HCL (PF) 2 % IJ SOLN
INTRAMUSCULAR | Status: AC
  Filled 2022-10-07: qty 5

## 2022-10-07 MED ORDER — ACETAMINOPHEN 160 MG/5ML PO SOLN: 325.0000 mg | ORAL | Status: DC | PRN

## 2022-10-07 MED ORDER — METOPROLOL TARTRATE 5 MG/5ML IV SOLN: 5.0000 mg | Freq: Four times a day (QID) | INTRAVENOUS | Status: DC | PRN

## 2022-10-07 MED ORDER — PROPOFOL 10 MG/ML IV BOLUS
INTRAVENOUS | Status: DC | PRN
  Administered 2022-10-07: 110 mg via INTRAVENOUS

## 2022-10-07 MED ORDER — BUPIVACAINE HCL (PF) 0.25 % IJ SOLN
INTRAMUSCULAR | Status: DC | PRN
  Administered 2022-10-07: 40 mL

## 2022-10-07 MED ORDER — DIPHENHYDRAMINE HCL 12.5 MG/5ML PO ELIX: 12.5000 mg | ORAL_SOLUTION | Freq: Four times a day (QID) | ORAL | Status: DC | PRN

## 2022-10-07 MED ORDER — PHENYLEPHRINE 80 MCG/ML (10ML) SYRINGE FOR IV PUSH (FOR BLOOD PRESSURE SUPPORT)
PREFILLED_SYRINGE | INTRAVENOUS | Status: AC
  Filled 2022-10-07: qty 10

## 2022-10-07 MED ORDER — ACETAMINOPHEN 325 MG PO TABS: 325.0000 mg | ORAL_TABLET | ORAL | Status: DC | PRN

## 2022-10-07 MED ORDER — KETAMINE HCL 10 MG/ML IJ SOLN
INTRAMUSCULAR | Status: DC | PRN
  Administered 2022-10-07: 30 mg via INTRAVENOUS

## 2022-10-07 MED ORDER — ENSURE SURGERY PO LIQD
237.0000 mL | Freq: Two times a day (BID) | ORAL | Status: DC
  Administered 2022-10-09 – 2022-10-14 (×6): 237 mL via ORAL

## 2022-10-07 MED ORDER — 0.9 % SODIUM CHLORIDE (POUR BTL) OPTIME
TOPICAL | Status: DC | PRN
  Administered 2022-10-07: 2000 mL

## 2022-10-07 MED ORDER — PROCHLORPERAZINE EDISYLATE 10 MG/2ML IJ SOLN
5.0000 mg | Freq: Four times a day (QID) | INTRAMUSCULAR | Status: DC | PRN
  Administered 2022-10-09 – 2022-10-11 (×2): 10 mg via INTRAVENOUS
  Administered 2022-10-12: 5 mg via INTRAVENOUS
  Administered 2022-10-13 – 2022-10-14 (×2): 10 mg via INTRAVENOUS
  Filled 2022-10-07 (×6): qty 2

## 2022-10-07 MED ORDER — CHLORHEXIDINE GLUCONATE 0.12 % MT SOLN
15.0000 mL | Freq: Once | OROMUCOSAL | Status: AC
  Administered 2022-10-07: 15 mL via OROMUCOSAL

## 2022-10-07 MED ORDER — PROCHLORPERAZINE MALEATE 10 MG PO TABS: 10.0000 mg | ORAL_TABLET | Freq: Four times a day (QID) | ORAL | Status: DC | PRN

## 2022-10-07 MED ORDER — SIMETHICONE 80 MG PO CHEW
40.0000 mg | CHEWABLE_TABLET | Freq: Four times a day (QID) | ORAL | Status: DC | PRN
  Administered 2022-10-07 – 2022-10-13 (×3): 40 mg via ORAL
  Filled 2022-10-07 (×4): qty 1

## 2022-10-07 MED ORDER — ONDANSETRON HCL 4 MG/2ML IJ SOLN
4.0000 mg | Freq: Four times a day (QID) | INTRAMUSCULAR | Status: DC | PRN
  Administered 2022-10-08 – 2022-10-09 (×3): 4 mg via INTRAVENOUS
  Filled 2022-10-07 (×3): qty 2

## 2022-10-07 MED ORDER — ENSURE PRE-SURGERY PO LIQD
296.0000 mL | Freq: Once | ORAL | Status: DC
  Filled 2022-10-07: qty 296

## 2022-10-07 MED ORDER — LIDOCAINE HCL (PF) 2 % IJ SOLN
INTRAMUSCULAR | Status: DC | PRN
  Administered 2022-10-07: 1.5 mg/kg/h via INTRADERMAL

## 2022-10-07 MED ORDER — ONDANSETRON HCL 4 MG PO TABS: 4.0000 mg | ORAL_TABLET | Freq: Four times a day (QID) | ORAL | Status: DC | PRN

## 2022-10-07 MED ORDER — SPY AGENT GREEN - (INDOCYANINE FOR INJECTION)
INTRAMUSCULAR | Status: DC | PRN
  Administered 2022-10-07: 3 mL via INTRAVENOUS
  Administered 2022-10-07: 5 mL via INTRAVENOUS

## 2022-10-07 MED ORDER — LIDOCAINE HCL (PF) 2 % IJ SOLN
INTRAMUSCULAR | Status: AC
  Filled 2022-10-07: qty 15

## 2022-10-07 MED ORDER — TRAMADOL HCL 50 MG PO TABS
50.0000 mg | ORAL_TABLET | Freq: Four times a day (QID) | ORAL | Status: DC | PRN
  Administered 2022-10-08 – 2022-10-09 (×2): 50 mg via ORAL
  Filled 2022-10-07 (×2): qty 1

## 2022-10-07 MED ORDER — EPHEDRINE 5 MG/ML INJ
INTRAVENOUS | Status: AC
  Filled 2022-10-07: qty 5

## 2022-10-07 MED ORDER — DEXAMETHASONE SODIUM PHOSPHATE 4 MG/ML IJ SOLN
INTRAMUSCULAR | Status: DC | PRN
  Administered 2022-10-07: 5 mg via INTRAVENOUS

## 2022-10-07 MED ORDER — MELATONIN 3 MG PO TABS: 3.0000 mg | ORAL_TABLET | Freq: Every evening | ORAL | Status: DC | PRN

## 2022-10-07 SURGICAL SUPPLY — 112 items
APL PRP STRL LF DISP 70% ISPRP (MISCELLANEOUS) ×1
APPLIER CLIP 5 13 M/L LIGAMAX5 (MISCELLANEOUS)
APPLIER CLIP ROT 10 11.4 M/L (STAPLE)
APR CLP MED LRG 11.4X10 (STAPLE)
APR CLP MED LRG 5 ANG JAW (MISCELLANEOUS)
BAG COUNTER SPONGE SURGICOUNT (BAG) ×1 IMPLANT
BAG SPNG CNTER NS LX DISP (BAG) ×1
BLADE EXTENDED COATED 6.5IN (ELECTRODE) IMPLANT
CANNULA REDUCER 12-8 DVNC XI (CANNULA) IMPLANT
CELLS DAT CNTRL 66122 CELL SVR (MISCELLANEOUS) IMPLANT
CHLORAPREP W/TINT 26 (MISCELLANEOUS) IMPLANT
CLIP APPLIE 5 13 M/L LIGAMAX5 (MISCELLANEOUS) IMPLANT
CLIP APPLIE ROT 10 11.4 M/L (STAPLE) IMPLANT
COVER SURGICAL LIGHT HANDLE (MISCELLANEOUS) ×2 IMPLANT
COVER TIP SHEARS 8 DVNC (MISCELLANEOUS) ×1 IMPLANT
DEVICE TROCAR PUNCTURE CLOSURE (ENDOMECHANICALS) IMPLANT
DRAIN CHANNEL 19F RND (DRAIN) IMPLANT
DRAPE ARM DVNC X/XI (DISPOSABLE) ×4 IMPLANT
DRAPE COLUMN DVNC XI (DISPOSABLE) ×1 IMPLANT
DRAPE SURG IRRIG POUCH 19X23 (DRAPES) ×1 IMPLANT
DRIVER NDL LRG 8 DVNC XI (INSTRUMENTS) ×1 IMPLANT
DRIVER NDLE LRG 8 DVNC XI (INSTRUMENTS) ×1 IMPLANT
DRSG IV TEGADERM 3.5X4.5 STRL (GAUZE/BANDAGES/DRESSINGS) IMPLANT
DRSG OPSITE POSTOP 4X10 (GAUZE/BANDAGES/DRESSINGS) IMPLANT
DRSG OPSITE POSTOP 4X6 (GAUZE/BANDAGES/DRESSINGS) IMPLANT
DRSG OPSITE POSTOP 4X8 (GAUZE/BANDAGES/DRESSINGS) IMPLANT
DRSG TEGADERM 2-3/8X2-3/4 SM (GAUZE/BANDAGES/DRESSINGS) ×5 IMPLANT
DRSG TEGADERM 4X4.75 (GAUZE/BANDAGES/DRESSINGS) IMPLANT
ELECT PENCIL ROCKER SW 15FT (MISCELLANEOUS) ×1 IMPLANT
ELECT REM PT RETURN 15FT ADLT (MISCELLANEOUS) ×1 IMPLANT
ENDOLOOP SUT PDS II  0 18 (SUTURE)
ENDOLOOP SUT PDS II 0 18 (SUTURE) IMPLANT
EVACUATOR SILICONE 100CC (DRAIN) IMPLANT
GAUZE SPONGE 2X2 8PLY STRL LF (GAUZE/BANDAGES/DRESSINGS) ×1 IMPLANT
GLOVE ECLIPSE 8.0 STRL XLNG CF (GLOVE) ×3 IMPLANT
GLOVE INDICATOR 8.0 STRL GRN (GLOVE) ×3 IMPLANT
GOWN SRG XL LVL 4 BRTHBL STRL (GOWNS) ×1 IMPLANT
GOWN STRL NON-REIN XL LVL4 (GOWNS) ×1
GOWN STRL REUS W/ TWL XL LVL3 (GOWN DISPOSABLE) ×4 IMPLANT
GOWN STRL REUS W/TWL XL LVL3 (GOWN DISPOSABLE) ×4
GRASPER SUT TROCAR 14GX15 (MISCELLANEOUS) IMPLANT
GRASPER TIP-UP FEN DVNC XI (INSTRUMENTS) ×1 IMPLANT
HOLDER FOLEY CATH W/STRAP (MISCELLANEOUS) ×1 IMPLANT
IRRIG SUCT STRYKERFLOW 2 WTIP (MISCELLANEOUS) ×1
IRRIGATION SUCT STRKRFLW 2 WTP (MISCELLANEOUS) ×1 IMPLANT
KIT PROCEDURE DVNC SI (MISCELLANEOUS) IMPLANT
KIT SIGMOIDOSCOPE (SET/KITS/TRAYS/PACK) IMPLANT
KIT TURNOVER KIT A (KITS) IMPLANT
NDL INSUFFLATION 14GA 120MM (NEEDLE) ×1 IMPLANT
NEEDLE INSUFFLATION 14GA 120MM (NEEDLE) ×1 IMPLANT
PACK CARDIOVASCULAR III (CUSTOM PROCEDURE TRAY) ×1 IMPLANT
PACK COLON (CUSTOM PROCEDURE TRAY) ×1 IMPLANT
PAD POSITIONING PINK XL (MISCELLANEOUS) ×1 IMPLANT
PROTECTOR NERVE ULNAR (MISCELLANEOUS) ×2 IMPLANT
RELOAD STAPLE 45 3.5 BLU DVNC (STAPLE) IMPLANT
RELOAD STAPLE 45 4.3 GRN DVNC (STAPLE) IMPLANT
RELOAD STAPLE 60 2.5 WHT DVNC (STAPLE) IMPLANT
RELOAD STAPLE 60 3.5 BLU DVNC (STAPLE) IMPLANT
RELOAD STAPLE 60 4.3 GRN DVNC (STAPLE) IMPLANT
RELOAD STAPLER 2.5X60 WHT DVNC (STAPLE) ×1 IMPLANT
RELOAD STAPLER 3.5X45 BLU DVNC (STAPLE) IMPLANT
RELOAD STAPLER 3.5X60 BLU DVNC (STAPLE) ×3 IMPLANT
RELOAD STAPLER 4.3X45 GRN DVNC (STAPLE) IMPLANT
RELOAD STAPLER 4.3X60 GRN DVNC (STAPLE) IMPLANT
RETRACTOR WND ALEXIS 18 MED (MISCELLANEOUS) IMPLANT
RTRCTR WOUND ALEXIS 18CM MED (MISCELLANEOUS)
SCISSORS LAP 5X35 DISP (ENDOMECHANICALS) ×1 IMPLANT
SCISSORS MNPLR CVD DVNC XI (INSTRUMENTS) ×1 IMPLANT
SEAL UNIV 5-12 XI (MISCELLANEOUS) ×4 IMPLANT
SEALER VESSEL EXT DVNC XI (MISCELLANEOUS) ×1 IMPLANT
SOL ELECTROSURG ANTI STICK (MISCELLANEOUS) ×1
SOLUTION ELECTROSURG ANTI STCK (MISCELLANEOUS) ×1 IMPLANT
SPIKE FLUID TRANSFER (MISCELLANEOUS) ×1 IMPLANT
STAPLER 45 SUREFORM DVNC (STAPLE) IMPLANT
STAPLER 60 SUREFORM DVNC (STAPLE) IMPLANT
STAPLER ECHELON POWER CIR 29 (STAPLE) IMPLANT
STAPLER ECHELON POWER CIR 31 (STAPLE) IMPLANT
STAPLER RELOAD 2.5X60 WHT DVNC (STAPLE) ×1
STAPLER RELOAD 3.5X45 BLU DVNC (STAPLE)
STAPLER RELOAD 3.5X60 BLU DVNC (STAPLE) ×3
STAPLER RELOAD 4.3X45 GRN DVNC (STAPLE)
STAPLER RELOAD 4.3X60 GRN DVNC (STAPLE)
STOPCOCK 4 WAY LG BORE MALE ST (IV SETS) ×2 IMPLANT
SURGILUBE 2OZ TUBE FLIPTOP (MISCELLANEOUS) IMPLANT
SUT MNCRL AB 4-0 PS2 18 (SUTURE) ×1 IMPLANT
SUT PDS AB 1 CT1 27 (SUTURE) ×2 IMPLANT
SUT PROLENE 0 CT 2 (SUTURE) IMPLANT
SUT PROLENE 2 0 KS (SUTURE) IMPLANT
SUT PROLENE 2 0 SH DA (SUTURE) IMPLANT
SUT SILK 2 0 (SUTURE)
SUT SILK 2 0 SH CR/8 (SUTURE) IMPLANT
SUT SILK 2-0 18XBRD TIE 12 (SUTURE) IMPLANT
SUT SILK 3 0 (SUTURE)
SUT SILK 3 0 SH CR/8 (SUTURE) ×1 IMPLANT
SUT SILK 3-0 18XBRD TIE 12 (SUTURE) IMPLANT
SUT V-LOC BARB 180 2/0GR6 GS22 (SUTURE) ×2
SUT VIC AB 3-0 SH 18 (SUTURE) IMPLANT
SUT VIC AB 3-0 SH 27 (SUTURE)
SUT VIC AB 3-0 SH 27XBRD (SUTURE) IMPLANT
SUT VICRYL 0 UR6 27IN ABS (SUTURE) ×1 IMPLANT
SUT VLOC 180 2-0 9IN GS21 (SUTURE) IMPLANT
SUTURE V-LC BRB 180 2/0GR6GS22 (SUTURE) IMPLANT
SYR 20ML ECCENTRIC (SYRINGE) ×1 IMPLANT
SYS LAPSCP GELPORT 120MM (MISCELLANEOUS)
SYS WOUND ALEXIS 18CM MED (MISCELLANEOUS) ×1
SYSTEM LAPSCP GELPORT 120MM (MISCELLANEOUS) IMPLANT
SYSTEM WOUND ALEXIS 18CM MED (MISCELLANEOUS) ×1 IMPLANT
TOWEL OR NON WOVEN STRL DISP B (DISPOSABLE) ×1 IMPLANT
TRAY FOLEY MTR SLVR 16FR STAT (SET/KITS/TRAYS/PACK) ×1 IMPLANT
TROCAR ADV FIXATION 5X100MM (TROCAR) ×1 IMPLANT
TUBING CONNECTING 10 (TUBING) ×2 IMPLANT
TUBING INSUFFLATION 10FT LAP (TUBING) ×1 IMPLANT

## 2022-10-07 NOTE — Interval H&P Note (Signed)
History and Physical Interval Note:  10/07/2022 11:10 AM  Christy Kirby  has presented today for surgery, with the diagnosis of mass in small intestine, probable carcinoid.  The various methods of treatment have been discussed with the patient and family. After consideration of risks, benefits and other options for treatment, the patient has consented to  Procedure(s): XI ROBOTIC ASSISTED SMALL BOWEL RESECTION (N/A) as a surgical intervention.  The patient's history has been reviewed, patient examined, no change in status, stable for surgery.  I have reviewed the patient's chart and labs.  Questions were answered to the patient's satisfaction.    I have re-reviewed the the patient's records, history, medications, and allergies.  I have re-examined the patient.  I again discussed intraoperative plans and goals of post-operative recovery.  The patient agrees to proceed.  Christy Kirby  [birth year] [id number]  Patient Care Team: Nelwyn Salisbury, MD as PCP - Erline Hau, MD as Consulting Physician (General Surgery) Hilarie Fredrickson, MD as Consulting Physician (Gastroenterology)  Patient Active Problem List   Diagnosis Date Noted   Acute gastritis 06/09/2022    Priority: Medium    Gastroenteritis 06/10/2022   Abnormal CT of the abdomen 06/10/2022   Lactic acidosis 06/08/2022   Hypokalemia 06/08/2022   Hypomagnesemia 06/08/2022   Intractable nausea and vomiting 06/07/2022   Abnormal CT scan, gastrointestinal tract 06/07/2022   Hypertensive urgency 06/06/2022   Elevated lactic acid level 06/06/2022   Abdominal pain with vomiting 06/05/2022   HTN (hypertension) 09/30/2020   Osteoarthritis of right hip 01/11/2018   Hyperlipemia, mixed 02/08/2017   HIP PAIN, BILATERAL 04/07/2009   BACK PAIN, LUMBAR 04/07/2009   GERD 03/19/2007    Past Medical History:  Diagnosis Date   Arthritis    hip   GERD (gastroesophageal reflux disease)    Hyperlipidemia    Hypertension     Intractable nausea and vomiting 06/07/2022    Past Surgical History:  Procedure Laterality Date   BIOPSY  06/08/2022   Procedure: BIOPSY;  Surgeon: Lemar Lofty., MD;  Location: [facility];  Service: Gastroenterology;;   COLONOSCOPY  06/26/2017   per Dr. Marina Goodell, benign polyps, repeat in 10 yrs    ESOPHAGOGASTRODUODENOSCOPY (EGD) WITH PROPOFOL N/A 06/08/2022   Procedure: ESOPHAGOGASTRODUODENOSCOPY (EGD) WITH PROPOFOL;  Surgeon: Lemar Lofty., MD;  Location: [facility];  Service: Gastroenterology;  Laterality: N/A;   NEUROPLASTY / TRANSPOSITION MEDIAN NERVE AT CARPAL TUNNEL BILATERAL     POLYPECTOMY     TOTAL HIP ARTHROPLASTY Right 01/11/2018   Procedure: RIGHT TOTAL HIP ARTHROPLASTY ANTERIOR APPROACH;  Surgeon: Samson Frederic, MD;  Location: [facility];  Service: Orthopedics;  Laterality: Right;    Social History   Socioeconomic History   Marital status: Legally Separated    Spouse name: Not on file   Number of children: Not on file   Years of education: Not on file   Highest education level: Not on file  Occupational History   Not on file  Tobacco Use   Smoking status: Former    Years: 1    Types: Cigarettes   Smokeless tobacco: Never   Tobacco comments:    only when [age] years old briefly   Vaping Use   Vaping Use: Never used  Substance and Sexual Activity   Alcohol use: Not Currently    Comment: occ wine    Drug use: No   Sexual activity: Not Currently  Other Topics Concern   Not on file  Social History Narrative  Not on file   Social Determinants of Health   Financial Resource Strain: Low Risk  (04/05/2022)   Overall Financial Resource Strain (CARDIA)    Difficulty of Paying Living Expenses: Not hard at all  Food Insecurity: No Food Insecurity (06/13/2022)   Hunger Vital Sign    Worried About Running Out of Food in the Last Year: Never true    Ran Out of Food in the Last Year: Never true  Transportation Needs: No Transportation Needs (06/13/2022)    PRAPARE - Administrator, Civil Service (Medical): No    Lack of Transportation (Non-Medical): No  Physical Activity: Inactive (04/05/2022)   Exercise Vital Sign    Days of Exercise per Week: 0 days    Minutes of Exercise per Session: 0 min  Stress: No Stress Concern Present (04/05/2022)   [employer] of Occupational Health - Occupational Stress Questionnaire    Feeling of Stress : Not at all  Social Connections: Moderately Integrated (04/05/2022)   Social Connection and Isolation Panel [NHANES]    Frequency of Communication with Friends and Family: More than three times a week    Frequency of Social Gatherings with Friends and Family: More than three times a week    Attends Religious Services: More than 4 times per year    Active Member of [employer] or Organizations: Yes    Attends Banker Meetings: More than 4 times per year    Marital Status: Separated  Intimate Partner Violence: Not At Risk (04/05/2022)   Humiliation, Afraid, Rape, and Kick questionnaire    Fear of Current or Ex-Partner: No    Emotionally Abused: No    Physically Abused: No    Sexually Abused: No    Family History  Problem Relation Age of Onset   Diabetes Sister    Colon cancer Neg Hx    Colon polyps Neg Hx    Esophageal cancer Neg Hx    Rectal cancer Neg Hx    Stomach cancer Neg Hx     Medications Prior to Admission  Medication Sig Dispense Refill Last Dose   ibuprofen (ADVIL) 800 MG tablet Take 800 mg by mouth every 8 (eight) hours as needed for mild pain or moderate pain (Back pain).      metoprolol tartrate (LOPRESSOR) 25 MG tablet TAKE 0.5 TABLETS BY MOUTH 2 TIMES DAILY. 90 tablet 0    acetaminophen (TYLENOL) 325 MG tablet Take 2 tablets (650 mg total) by mouth every 6 (six) hours as needed for mild pain or headache. (Patient not taking: Reported on 08/04/2022)   Not Taking   Bismuth/Metronidaz/Tetracyclin (PYLERA) 140-125-125 MG CAPS Take 3 Capfuls by mouth 4 (four) times  daily. (Patient not taking: Reported on 08/04/2022) 120 capsule 0 Not Taking   ondansetron (ZOFRAN) 4 MG tablet Take 1 tablet (4 mg total) by mouth every 8 (eight) hours as needed for nausea or vomiting. (Patient not taking: Reported on 08/04/2022) 20 tablet 0 Not Taking   pantoprazole (PROTONIX) 40 MG tablet Take 1 tablet (40 mg total) by mouth 2 (two) times daily for 30 days, THEN 1 tablet (40 mg total) daily. (Patient not taking: Reported on 08/04/2022) 120 tablet 0 Not Taking   polycarbophil (FIBERCON) 625 MG tablet Take 1 tablet (625 mg total) by mouth 2 (two) times daily. (Patient not taking: Reported on 08/04/2022)   Not Taking   sucralfate (CARAFATE) 1 g tablet Take 1 tablet (1 g total) by mouth 2 (two) times daily. (Patient not  taking: Reported on 09/26/2022) 60 tablet 1 Not Taking   traMADol (ULTRAM) 50 MG tablet Take 1 tablet (50 mg total) by mouth every 6 (six) hours as needed. (Patient not taking: Reported on 08/04/2022) 15 tablet 0 Not Taking    Current Facility-Administered Medications  Medication Dose Route Frequency Provider Last Rate Last Admin   acetaminophen (TYLENOL) tablet 1,000 mg  1,000 mg Oral On Call to OR Karie Soda, MD       alvimopan (ENTEREG) capsule 12 mg  12 mg Oral On Call to OR Karie Soda, MD       bupivacaine liposome (EXPAREL) 1.3 % injection 266 mg  20 mL Infiltration Once Karie Soda, MD       cefoTEtan (CEFOTAN) 2 g in sodium chloride 0.9 % 100 mL IVPB  2 g Intravenous On Call to OR Karie Soda, MD       chlorhexidine (PERIDEX) 0.12 % solution 15 mL  15 mL Mouth/Throat Once Jairo Ben, MD       Or   Oral care mouth rinse  15 mL Mouth Rinse Once Jairo Ben, MD       enoxaparin (LOVENOX) injection 40 mg  40 mg Subcutaneous Once Karie Soda, MD       Melene Muller ON 10/08/2022] feeding supplement (ENSURE PRE-SURGERY) liquid 296 mL  296 mL Oral Once Karie Soda, MD       feeding supplement (ENSURE PRE-SURGERY) liquid 592 mL  592 mL Oral Once Karie Soda, MD       gabapentin (NEURONTIN) capsule 300 mg  300 mg Oral On Call to OR Karie Soda, MD       lactated ringers infusion   Intravenous Continuous Jairo Ben, MD         Allergies  Allergen Reactions   Codeine Nausea Only   Nsaids Other (See Comments)    History of gastritis = minimize NSAIDs    There were no vitals taken for this visit.  Labs: No results found for this or any previous visit (from the past 48 hour(s)).  Imaging / Studies: No results found.   Ardeth Sportsman, M.D., F.A.C.S. Gastrointestinal and Minimally Invasive Surgery [facility] [address] [place], [place] [address] [phone] Main / Paging  10/07/2022 11:11 AM    Ardeth Sportsman

## 2022-10-07 NOTE — Transfer of Care (Signed)
Immediate Anesthesia Transfer of Care Note  Patient: Christy Kirby  Procedure(s) Performed: XI ROBOTIC ASSISTED SMALL BOWEL RESECTION, BILATERAL TAP BLOCK, ASSESSMENT OF TISSUE PERFUSSION VIA FIREFLY INJECTION  Patient Location: PACU  Anesthesia Type:General  Level of Consciousness: drowsy and patient cooperative  Airway & Oxygen Therapy: Patient Spontanous Breathing and Patient connected to face mask oxygen  Post-op Assessment: Report given to RN and Post -op Vital signs reviewed and stable  Post vital signs: Reviewed and stable  Last Vitals:  Vitals Value Taken Time  BP 179/101 10/07/22 1601  Temp    Pulse 75 10/07/22 1602  Resp 14 10/07/22 1602  SpO2 100 % 10/07/22 1602  Vitals shown include unvalidated device data.  Last Pain:  Vitals:   10/07/22 1132  TempSrc:   PainSc: 0-No pain         Complications: No notable events documented.

## 2022-10-07 NOTE — Anesthesia Preprocedure Evaluation (Addendum)
Anesthesia Evaluation  Patient identified by MRN, date of birth, ID band Patient awake    Reviewed: Allergy & Precautions, NPO status , Patient's Chart, lab work & pertinent test results  Airway Mallampati: I  TM Distance: >3 FB Neck ROM: Full    Dental no notable dental hx. (+) Dental Advisory Given, Teeth Intact   Pulmonary former smoker   Pulmonary exam normal breath sounds clear to auscultation       Cardiovascular Exercise Tolerance: Good hypertension, Pt. on medications and Pt. on home beta blockers Normal cardiovascular exam Rhythm:Regular Rate:Normal     Neuro/Psych negative neurological ROS  negative psych ROS   GI/Hepatic Neg liver ROS,GERD  Medicated,,  Endo/Other  negative endocrine ROS    Renal/GU negative Renal ROS  negative genitourinary   Musculoskeletal  (+) Arthritis , Osteoarthritis,  Degenerative joint disease right hip   Abdominal Normal abdominal exam  (+)   Peds  Hematology negative hematology ROS (+) Plt 265k   Anesthesia Other Findings Day of surgery medications reviewed with the patient.  Reproductive/Obstetrics                             Anesthesia Physical Anesthesia Plan  ASA: 3  Anesthesia Plan: General   Post-op Pain Management: Tylenol PO (pre-op)*, Celebrex PO (pre-op)* and Dilaudid IV   Induction: Intravenous  PONV Risk Score and Plan: 2 and Treatment may vary due to age or medical condition, Ondansetron and Dexamethasone  Airway Management Planned: Natural Airway and Mask  Additional Equipment: None  Intra-op Plan:   Post-operative Plan:   Informed Consent: I have reviewed the patients History and Physical, chart, labs and discussed the procedure including the risks, benefits and alternatives for the proposed anesthesia with the patient or authorized representative who has indicated his/her understanding and acceptance.     Dental  advisory given  Plan Discussed with: CRNA and Anesthesiologist  Anesthesia Plan Comments:         Anesthesia Quick Evaluation

## 2022-10-07 NOTE — Anesthesia Postprocedure Evaluation (Signed)
Anesthesia Post Note  Patient: Christy Kirby  Procedure(s) Performed: XI ROBOTIC ASSISTED SMALL BOWEL RESECTION, BILATERAL TAP BLOCK, ASSESSMENT OF TISSUE PERFUSSION VIA FIREFLY INJECTION     Patient location during evaluation: PACU Anesthesia Type: General Level of consciousness: awake and alert Pain management: pain level controlled Vital Signs Assessment: post-procedure vital signs reviewed and stable Respiratory status: spontaneous breathing, nonlabored ventilation, respiratory function stable and patient connected to nasal cannula oxygen Cardiovascular status: blood pressure returned to baseline and stable Postop Assessment: no apparent nausea or vomiting Anesthetic complications: no  No notable events documented.  Last Vitals:  Vitals:   10/07/22 1645 10/07/22 1700  BP: (!) 150/90 (!) 165/94  Pulse: 84 85  Resp: 13 15  Temp:  36.4 C  SpO2: 100% 100%    Last Pain:  Vitals:   10/07/22 1700  TempSrc:   PainSc: 4                  Salam Micucci,W. EDMOND

## 2022-10-07 NOTE — Anesthesia Procedure Notes (Signed)
Procedure Name: Intubation Date/Time: 10/07/2022 12:50 PM  Performed by: Vanessa Oak Level, CRNAPre-anesthesia Checklist: Patient identified, Emergency Drugs available, Suction available and Patient being monitored Patient Re-evaluated:Patient Re-evaluated prior to induction Oxygen Delivery Method: Circle system utilized Preoxygenation: Pre-oxygenation with 100% oxygen Induction Type: IV induction Ventilation: Mask ventilation without difficulty Laryngoscope Size: 2 and Miller Grade View: Grade I Tube type: Oral Tube size: 7.0 mm Number of attempts: 1 Airway Equipment and Method: Stylet Placement Confirmation: ETT inserted through vocal cords under direct vision, positive ETCO2 and breath sounds checked- equal and bilateral Secured at: 21 cm Tube secured with: Tape Dental Injury: Teeth and Oropharynx as per pre-operative assessment

## 2022-10-07 NOTE — H&P (Signed)
10/07/2022   REFERRING PHYSICIAN: Roxy Cedar., MD  Patient Care Team: Dwaine Deter, MD as PCP - General (Family Medicine) Michaell Cowing, Shawn Route, MD as Consulting Provider (General Surgery)  PROVIDER: Jarrett Soho, MD  [facility] MRN: [id number] DOB: 29-Jan-1951  SUBJECTIVE  Chief Complaint: New Consultation (11 mm distal small bowel lesion )   Christy Kirby is a 72 y.o. female who is seen today as an office consultation at the request of Dr.. Marina Goodell for evaluation of persistent small bowel mass. Possible carcinoid.  History of Present Illness:  Pleasant woman with abdominal pain. Admitted in January nausea vomiting abdominal pain. Had some loose bowel movements and then diarrhea. Concern for possible gastroenteritis. Admitted. Endoscopy done. Placed on PPIs for gastritis. CT scan raise concern of small lesion hypoechoic within the small bowel. Hard to know if the diverticula versus a cyst. Asymptomatic and feeling well. She clinically improved rapidly. Recommendation made for outpatient follow-up CT enterography. That was done in February. Raise concerns of more persistent lesion such as a carcinoid. Saw gastroenterology who recommended surgical reevaluation.  Patient comes back with her husband. She feels totally fine. No cramping. No nausea or vomiting. Moves her bowels every day. Claims she can walk several miles without difficulty. She does not smoke. No diabetes. She is never had any abdominal surgery. No personal or family history of any digestive tract issues  Medical History:  Past Medical History: Diagnosis Date Hypertension  Patient Active Problem List Diagnosis Small bowel mass History of abdominal pain  Past Surgical History: Procedure Laterality Date CONVERSION PREVIOUS HIP SURGERY TO TOTAL HIP ARTHROPLASTY   Allergies Allergen Reactions Codeine Nausea  Current Outpatient Medications on File Prior to Visit Medication Sig Dispense  Refill metoprolol tartrate (LOPRESSOR) 25 MG tablet TAKE 0.5 TABLETS BY MOUTH 2 TIMES DAILY.  No current facility-administered medications on file prior to visit.  No family history on file.  Social History  Tobacco Use Smoking Status Former Types: Cigarettes Smokeless Tobacco Never   Social History  Socioeconomic History Marital status: Married Tobacco Use Smoking status: Former Types: Cigarettes Smokeless tobacco: Never Vaping Use Vaping Use: Never used Substance and Sexual Activity Alcohol use: Not Currently Drug use: Never  ############################################################  Review of Systems: A complete review of systems (ROS) was obtained from the patient. We have reviewed this information and discussed as appropriate with the patient. See HPI as well for other pertinent ROS.  Constitutional: No fevers, chills, sweats. Weight stable Eyes: No vision changes, No discharge HENT: No sore throats, nasal drainage Lymph: No neck swelling, No bruising easily Pulmonary: No cough, productive sputum CV: No orthopnea, PND . No exertional chest/neck/shoulder/arm pain. Patient can walk 2-3 miles without difficulty.  GI: No personal nor family history of GI/colon cancer, inflammatory bowel disease, irritable bowel syndrome, allergy such as Celiac Sprue, dietary/dairy problems, colitis, ulcers nor gastritis. No recent sick contacts. No travel outside the country. No changes in diet.  Renal: No UTIs, No hematuria Genital: No drainage, bleeding, masses Musculoskeletal: No severe joint pain. Good ROM major joints Skin: No sores or lesions Heme/Lymph: No easy bleeding. No swollen lymph nodes Neuro: No active seizures. No facial droop Psych: No hallucinations. No agitation  OBJECTIVE  Vitals: 09/06/22 1007 BP: (!) 144/82 Pulse: 87 Temp: 36.5 C (97.7 F) SpO2: 95% Weight: 63.3 kg (139 lb 9.6 oz) Height: 167.6 cm (5\' 6" ) PainSc: 0-No pain  Body mass index is  22.53 kg/m.  PHYSICAL EXAM:  Constitutional: Not cachectic. Hygeine adequate.  Vitals signs as above. Eyes: No glasses. Vision adequate,Pupils reactive, normal extraocular movements. Sclera nonicteric Neuro: CN II-XII intact. No major focal sensory defects. No major motor deficits. Lymph: No head/neck/groin lymphadenopathy Psych: No severe agitation. No severe anxiety. Judgment & insight Adequate, Oriented x4, HENT: Normocephalic, Mucus membranes moist. No thrush. Hearing: adequate Neck: Supple, No tracheal deviation. No obvious thyromegaly Chest: No pain to chest wall compression. Good respiratory excursion. No audible wheezing CV: Pulses intact. regular. No major extremity edema Ext: No obvious deformity or contracture. Edema: Not present. No cyanosis Skin: No major subcutaneous nodules. Warm and dry Musculoskeletal: Severe joint rigidity not present. No obvious clubbing. No digital petechiae. Mobility: no assist device moving easily without restrictions  Abdomen: Flat Soft. Nondistended. Nontender. Hernia: Not present. Diastasis recti: Not present. No hepatomegaly. No splenomegaly.  Genital/Pelvic: Inguinal hernia: Not present. Inguinal lymph nodes: without lymphadenopathy nor hidradenitis.  Rectal: (Deferred)    ###################################################################  Labs, Imaging and Diagnostic Testing:  Located in 'Care Everywhere' section of Epic EMR chart  PRIOR [facility] NOTES:  Located in 'Care Everywhere' section of Epic EMR chart  SURGERY NOTES:  Located in 'Care Everywhere' section of Epic EMR chart  PATHOLOGY:  Not applicable  Assessment and Plan: DIAGNOSES:  Diagnoses and all orders for this visit:  Small bowel mass  History of abdominal pain    ASSESSMENT/PLAN  Pleasant active woman with episode of crampy abdominal pain nausea and vomiting with questionable 2 cm hypoechoic mass in peritoneum of uncertain etiology. CT angiography  more strongly suspicious for intraluminal small bowel mass such as a carcinoid tumor and possible mildly enlarged lymph nodes. She has a persistent mass.  I strongly recommend minimally invasive exploration and probable small bowel resection. See if we can do this robotically with intracorporeal anastomosis.  The anatomy & physiology of the digestive tract was discussed. The pathophysiology of intestinal obstruction was discussed. Natural history risks without surgery was discussed. I feel the patient has failed non-operative therapies. The risks of no intervention will lead to serious problems such as necrosis, perforation, dehydration, etc. that outweigh the operative risks; therefore, I recommended abdominal exploration to diagnose & treat the source of the problem. Minimally Invasive & open techniques were discussed. I expressed a good likelihood that surgery will treat the problem.  Risks such as bleeding, infection, abscess, leak, reoperation, bowel resection, possible ostomy, hernia, injury to other organs, need for repair of tissues / organs, need for further treatment, heart attack, death, and other risks were discussed. I noted a good likelihood this will help address the problem. Goals of post-operative recovery were discussed as well. We will work to minimize complications. Questions were answered. The patient expresses understanding & wishes to proceed with surgery.

## 2022-10-07 NOTE — Op Note (Signed)
10/07/2022  3:47 PM  PATIENT:  Christy Kirby  72 y.o. female  Patient Care Team: Nelwyn Salisbury, MD as PCP - Erline Hau, MD as Consulting Physician (General Surgery) Hilarie Fredrickson, MD as Consulting Physician (Gastroenterology)  PRE-OPERATIVE DIAGNOSIS:  MASS IN SMALL INTESTINE, PROBABLE CARCINOID  POST-OPERATIVE DIAGNOSIS:   MASS IN SMALL INTESTINE, PROBABLE CARCINOID [name]  PROCEDURE:   -ROBOTIC ILEAL RESECTION -ROBOTIC APPENDECTOMY -ROBOTIC LYSIS OF ADHESIONS -INTRAOPERATIVE ASSESSMENT OF TISSUE VASCULAR PERFUSION USING ICG (indocyanine green) IMMUNOFLUORESCENCE -TRANSVERSUS ABDOMINIS PLANE (TAP) BLOCK - BILATERAL  SURGEON:  Ardeth Sportsman, MD  ANESTHESIA:  General endotracheal intubation anesthesia (GETA) and Regional TRANSVERSUS ABDOMINIS PLANE (TAP) nerve block -BILATERAL for perioperative & postoperative pain control at the level of the transverse abdominis & preperitoneal spaces along the flank at the anterior axillary line, from subcostal ridge to iliac crest under laparoscopic guidance provided with liposomal bupivacaine (Experel) 20mL mixed with 40 mL of bupivicaine 0.25%  Estimated Blood Loss (EBL):   Total I/O In: 1300 [I.V.:1200; IV Piggyback:100] Out: 130 [Urine:100; Blood:30].   (See anesthesia record)  Delay start of Pharmacological VTE agent (>24hrs) due to concerns of significant anemia, surgical blood loss, or risk of bleeding?:  no  DRAINS: (None)  SPECIMEN:   ILEUM WITH OBSTRUCTING MASS APPENDIX  DISPOSITION OF SPECIMEN:  Pathology  COUNTS:  Sponge, needle, & instrument counts CORRECT  PLAN OF CARE: Admit to inpatient   PATIENT DISPOSITION:  PACU - hemodynamically stable.  INDICATION:    Healthy with no prior abdominal surgery with obstructive symptoms.  Admitted with obstruction.  Concern of intraluminal mass.  Improved quickly.  CT enterography a few months later confirmed persistent mass.  I recommended  segmental resection:  The anatomy & physiology of the digestive tract was discussed.  The pathophysiology was discussed.  Natural history risks without surgery was discussed.   I worked to give an overview of the disease and the frequent need to have multispecialty involvement.  I feel the risks of no intervention will lead to serious problems that outweigh the operative risks; therefore, I recommended a partial colectomy to remove the pathology.  Laparoscopic & open techniques were discussed.   Risks such as bleeding, infection, abscess, leak, reoperation, possible ostomy, hernia, heart attack, death, and other risks were discussed.  I noted a good likelihood this will help address the problem.   Goals of post-operative recovery were discussed as well.  We will work to minimize complications.  An educational handout on the pathology was given as well.  Questions were answered.    The patient expresses understanding & wishes to proceed with surgery.  OR FINDINGS:   Patient had mass in mid ileum thickening causing near obstruction with nodularity in the mesentery.  Not a classic stellate carcinoid but suspicious versus primary cancer.  No obvious metastatic disease on visceral parietal peritoneum or liver.  Long torturous appendix with adhesions -appendectomy done.  It is an isoperistaltic ileo-ileal anastomosis that rests in the pelvic region.  CASE DATA:  Type of patient?: Elective [facility] Private Case  Status of Case? Elective Scheduled  Infection Present At Time Of Surgery (PATOS)?  NO    DESCRIPTION:   Informed consent was confirmed.  The patient underwent general anaesthesia without difficulty.  The patient was positioned with arms tucked & secured appropriately.  VTE prevention in place.  The patient's abdomen was clipped, prepped, & draped in a sterile fashion.  Surgical timeout confirmed our plan.  The  patient was positioned in reverse Trendelenburg.  Abdominal entry was gained using  Varess technique at the left subcostal ridge on the anterior abdominal wall.  No elevated EtCO2 noted.  Port placed.  Camera inspection revealed no injury.  Extra ports were carefully placed under direct laparoscopic visualization.  Did lyse lesions to free some omental adhesions in the left abdomen and placed the port safely.  Freed off some adhesions of the mesentery to the side as well.  Found the ileocecal valve and ran the small bowel and encountered an obvious nodularity of the mesentery that correlated with a firm fibrotic mass in the small bowel.  Suspicious for carcinoid versus primary cancer.  Decided to proceed with resection.  We docked the [employer] Vinc[employer] robot carefully and placed intstruments under visualization  I mobilized & reflected the greater omentum in the upper abdomen.  I again ran the small bowel from the ileocecal valve all the way to the ligament of Treitz.  There were few white serosal patches in the proximal jejunum that were soft and compressible not consider or tumor.  Again was the mass in the mid ileum with adhesions to the distal small bowel mesentery.  Small bowel mesentery was quite stretched out and floppy.  Ended up mobilizing the ileocecal mesentery off the retroperitoneum to help straighten out and untwist the region.  Appendix was long and tortuous and somewhat fibrotic and atypical.  Patient had some dense adhesions between the liver and the diaphragm and anterior wall consistent with Fitz-Hugh Curtis syndrome.  I lysed those to have a lot more mobility and exposure.  There is no evidence of any interloop adhesions causing closed-loop or trapped bowel as a source of obstruction.  There were no hernias to be a potential source of obstruction.  I was able to isolate the region of the ileum with the thickened area.  I proceeded to transect through the ileal mesentery by holding the mass cephalad.  It shows a distal region about 10 cm distally to the mass and came through  the ileal mesentery and came down more proximally.  Found another 10 cm region of ileum proximal to the mass and came through the mesentery and close to the small bowel and came down more proximally.  I then carefully skeletonized lymph nodes off the ileal mesentery and transected a good wedge of ileal mesentery with the main small bowel and some ureteric vessels laying posterior and cephalad.  Eventually came around it well to get good margins I removed the mesenteric nodularity which was probably lymph nodes.  Dissected a little bit more of small bowel mesentery where the distal margin of ileum mesentery it and thinned out and looked ischemic.  To access vascular perfusion of tissues, we asked anesthesia use intravenous  indocyanine green (ICG) with IV flush.  I switched to the NIR fluorescence (Firefly mode) imaging window on the daVinci robot platform.  We were able to see good light green visualization of blood vessels with good vascular perfusion of tissues, confirming good tissue perfusion of tissues (mid proximal and mid distal ileum) planned for anastomosis.    We transected at the mid-distal ileum with a robotic stapler 60mm blue load.  We then transected the proximal-mid ileum stapler 60mm blue load.  We confirmed hemostasis.   I did a side-to-side stapled anastomosis of ileum to ileum using a 60mm white load in an isoperistaltic fashion.  (Proximal stump of distal ileum to mid ileum for the proximal end of the anastomosis.  Distal end of proximal ileum stump to distal ileum for the distal end of the anastomosis).  I sewed the common staple channel wound with an absorbable suture (V-lock) in a running Cotati fashion from each corner and meeting in the center.  I did meticulous inspection prove an airtight closure.  I then closed the ileal ileal mesenteric defect using V-Loc in a running fashion on both sides.  The appendix looked rather poor quality so I decided to transect the appendiceal mesentery  and transcended the appendix off the cecum using a blue load of the stapler.  We did another round of intravenous firefly and confirmed good viability of the anastomosis with no evidence of ischemia after closing mesenteric defects or other abnormality.  Cecal stump looked viable.  Appendectomy staple line on the cecum look clean.  Unsure small bowel or other organs.  Ran from the cecum more proximally allowed in the cecum and ileum anastomosis was fall down to the pelvis naturally to avoid any further twisting or torsion.  We did reinspection of the abdomen.  Hemostasis was good.   Ureters, retroperitoneum, and bowel uninjured.  The anastomosis looked healthy.   Endoluminal gas was evacuated.  We placed the wound protector through the suprapubic 12mm port site after it was enlarged in a Pfannenstiel fashion.  Specimen removed without incident.  Inspected the anastomosis complete a couple silk stitches on it but looked healthy and viable without twisting or torsion.  Allowed her to fall down the pelvis  Ports & wound protector removed.  Hemostasis was good.  Sterile unused instruments were used from this point.  I closed the skin at the port sites using Monocryl stitch and sterile dressing.  I closed the extraction wound using a 0 Vicryl vertical peritoneal closure and a #1 PDS transverse anterior rectal fascial closure like a small Pfannenstiel closure. I closed the skin with some interrupted Monocryl stitches.  I placed sterile dressings.     Patient is being extubated go to recovery room. I discussed postop care with the patient in detail the office & in the holding area. Instructions are written. I discussed operative findings, updated the patient's status, discussed probable steps to recovery, and gave postoperative recommendations to the patient's spouse, Clarita Leber.  Recommendations were made.  Questions were answered.  He expressed understanding & appreciation.   Ardeth Sportsman, M.D.,  F.A.C.S. Gastrointestinal and Minimally Invasive Surgery [facility] [address] [place], [place] [address] [phone] Main / Paging

## 2022-10-07 NOTE — Discharge Instructions (Signed)
SURGERY: POST OP INSTRUCTIONS (Surgery for small bowel obstruction, colon resection, etc)   ######################################################################  EAT Gradually transition to a high fiber diet with a fiber supplement over the next few days after discharge  WALK Walk an hour a day.  Control your pain to do that.    CONTROL PAIN Control pain so that you can walk, sleep, tolerate sneezing/coughing, go up/down stairs.  HAVE A BOWEL MOVEMENT DAILY Keep your bowels regular to avoid problems.  OK to try a laxative to override constipation.  OK to use an antidairrheal to slow down diarrhea.  Call if not better after 2 tries  CALL IF YOU HAVE PROBLEMS/CONCERNS Call if you are still struggling despite following these instructions. Call if you have concerns not answered by these instructions  ######################################################################   DIET Follow a light diet the first few days at home.  Start with a bland diet such as soups, liquids, starchy foods, low fat foods, etc.  If you feel full, bloated, or constipated, stay on a ful liquid or pureed/blenderized diet for a few days until you feel better and no longer constipated. Be sure to drink plenty of fluids every day to avoid getting dehydrated (feeling dizzy, not urinating, etc.). Gradually add a fiber supplement to your diet over the next week.  Gradually get back to a regular solid diet.  Avoid fast food or heavy meals the first week as you are more likely to get nauseated. It is expected for your digestive tract to need a few months to get back to normal.  It is common for your bowel movements and stools to be irregular.  You will have occasional bloating and cramping that should eventually fade away.  Until you are eating solid food normally, off all pain medications, and back to regular activities; your bowels will not be normal. Focus on eating a low-fat, high fiber diet the rest of your life  (See Getting to Good Bowel Health, below).  CARE of your INCISION or WOUND  It is good for closed incisions and even open wounds to be washed every day.  Shower every day.  Short baths are fine.  Wash the incisions and wounds clean with soap & water.    You may leave closed incisions open to air if it is dry.   You may cover the incision with clean gauze & replace it after your daily shower for comfort.  TEGADERM:  You have clear gauze band-aid dressings over your closed incision(s).  Remove the dressings 3 days after surgery.    If you have an open wound with a wound vac, see wound vac care instructions.    ACTIVITIES as tolerated Start light daily activities --- self-care, walking, climbing stairs-- beginning the day after surgery.  Gradually increase activities as tolerated.  Control your pain to be active.  Stop when you are tired.  Ideally, walk several times a day, eventually an hour a day.   Most people are back to most day-to-day activities in a few weeks.  It takes 4-8 weeks to get back to unrestricted, intense activity. If you can walk 30 minutes without difficulty, it is safe to try more intense activity such as jogging, treadmill, bicycling, low-impact aerobics, swimming, etc. Save the most intensive and strenuous activity for last (Usually 4-8 weeks after surgery) such as sit-ups, heavy lifting, contact sports, etc.  Refrain from any intense heavy lifting or straining until you are off narcotics for pain control.  You will have off days, but   things should improve week-by-week. DO NOT PUSH THROUGH PAIN.  Let pain be your guide: If it hurts to do something, don't do it.  Pain is your body warning you to avoid that activity for another week until the pain goes down. You may drive when you are no longer taking narcotic prescription pain medication, you can comfortably wear a seatbelt, and you can safely make sudden turns/stops to protect yourself without hesitating due to pain. You may  have sexual intercourse when it is comfortable. If it hurts to do something, stop.   MEDICATIONS Take your usually prescribed home medications unless otherwise directed.   Blood thinners:  You can restart any strong blood thinners after the second postoperative day.  It is OK to continue aspirin before & after surgery..    Some blood in BMs the first 1-2 weeks is common but should taper down & be small volume.  If you are passing many large clots, call your surgeon    PAIN CONTROL Pain after surgery or related to activity is often due to strain/injury to muscle, tendon, nerves and/or incisions.  This pain is usually short-term and will improve in a few months.  To help speed the process of healing and to get back to regular activity more quickly, DO THE FOLLOWING THINGS TOGETHER: Increase activity gradually.  DO NOT PUSH THROUGH PAIN Use Ice and/or Heat Try Gentle Massage and/or Stretching Take over the counter pain medication Take Narcotic prescription pain medication for more severe pain  Good pain control = faster recovery.  It is better to take more medicine to be more active than to stay in bed all day to avoid medications.  Increase activity gradually Avoid heavy lifting at first, then increase to lifting as tolerated over the next 6 weeks. Do not "push through" the pain.  Listen to your body and avoid positions and maneuvers than reproduce the pain.  Wait a few days before trying something more intense Walking an hour a day is encouraged to help your body recover faster and more safely.  Start slowly and stop when getting sore.  If you can walk 30 minutes without stopping or pain, you can try more intense activity (running, jogging, aerobics, cycling, swimming, treadmill, sex, sports, weightlifting, etc.) Remember: If it hurts to do it, then don't do it! Use Ice and/or Heat You will have swelling and bruising around the incisions.  This will take several weeks to resolve. Ice packs  or heating pads (6-8 times a day, 30-60 minutes at a time) will help sooth soreness & bruising. Some people prefer to use ice alone, heat alone, or alternate between ice & heat.  Experiment and see what works best for you.  Consider trying ice for the first few days to help decrease swelling and bruising; then, switch to heat to help relax sore spots and speed recovery. Shower every day.  Short baths are fine.  It feels good!  Keep the incisions and wounds clean with soap & water.   Try Gentle Massage and/or Stretching Massage at the area of pain many times a day Stop if you feel pain - do not overdo it Take over the counter pain medication This helps the muscle and nerve tissues become less irritable and calm down faster Choose ONE of the following over-the-counter anti-inflammatory medications: Acetaminophen 500mg tabs (Tylenol) 1-2 pills with every meal and just before bedtime (avoid if you have liver problems or if you have acetaminophen in you narcotic prescription) Naproxen 220mg tabs (  ex. Aleve, Naprosyn) 1-2 pills twice a day (avoid if you have kidney, stomach, IBD, or bleeding problems) Ibuprofen 200mg tabs (ex. Advil, Motrin) 3-4 pills with every meal and just before bedtime (avoid if you have kidney, stomach, IBD, or bleeding problems) Take with food/snack several times a day as directed for at least 2 weeks to help keep pain / soreness down & more manageable. Take Narcotic prescription pain medication for more severe pain A prescription for strong pain control is often given to you upon discharge (for example: oxycodone/Percocet, hydrocodone/Norco/Vicodin, or tramadol/Ultram) Take your pain medication as prescribed. Be mindful that most narcotic prescriptions contain Tylenol (acetaminophen) as well - avoid taking too much Tylenol. If you are having problems/concerns with the prescription medicine (does not control pain, nausea, vomiting, rash, itching, etc.), please call us [phone]  [phone] to see if we need to switch you to a different pain medicine that will work better for you and/or control your side effects better. If you need a refill on your pain medication, you must call the office before 4 pm and on weekdays only.  By federal law, prescriptions for narcotics cannot be called into a pharmacy.  They must be filled out on paper & picked up from our office by the patient or authorized caretaker.  Prescriptions cannot be filled after 4 pm nor on weekends.    WHEN TO CALL US [phone] Severe uncontrolled or worsening pain  Fever over 101 F (38.5 C) Concerns with the incision: Worsening pain, redness, rash/hives, swelling, bleeding, or drainage Reactions / problems with new medications (itching, rash, hives, nausea, etc.) Nausea and/or vomiting Difficulty urinating Difficulty breathing Worsening fatigue, dizziness, lightheadedness, blurred vision Other concerns If you are not getting better after two weeks or are noticing you are getting worse, contact our office [phone] for further advice.  We may need to adjust your medications, re-evaluate you in the office, send you to the emergency room, or see what other things we can do to help. The clinic staff is available to answer your questions during regular business hours (8:30am-5pm).  Please don't hesitate to call and ask to speak to one of our nurses for clinical concerns.    A surgeon from [facility] is always on call at the hospitals 24 hours/day If you have a medical emergency, go to the nearest emergency room or call 911.  FOLLOW UP in our office One the day of your discharge from the hospital (or the next business weekday), please call [facility] to set up or confirm an appointment to see your surgeon in the office for a follow-up appointment.  Usually it is 2-3 weeks after your surgery.   If you have skin staples at your incision(s), let the office know so we can set up a time  in the office for the nurse to remove them (usually around 10 days after surgery). Make sure that you call for appointments the day of discharge (or the next business weekday) from the hospital to ensure a convenient appointment time. IF YOU HAVE DISABILITY OR FAMILY LEAVE FORMS, BRING THEM TO THE OFFICE FOR PROCESSING.  DO NOT GIVE THEM TO YOUR DOCTOR.  [facility] [address], [address], [place], [place]  [address] ? [phone] - Main [phone] - Toll Free,  [phone] - Fax [email or web address]    GETTING TO GOOD BOWEL HEALTH. It is expected for your digestive tract to need a few months to get back to   normal.  It is common for your bowel movements and stools to be irregular.  You will have occasional bloating and cramping that should eventually fade away.  Until you are eating solid food normally, off all pain medications, and back to regular activities; your bowels will not be normal.   Avoiding constipation The goal: ONE SOFT BOWEL MOVEMENT A DAY!    Drink plenty of fluids.  Choose water first. TAKE A FIBER SUPPLEMENT EVERY DAY THE REST OF YOUR LIFE During your first week back home, gradually add back a fiber supplement every day Experiment which form you can tolerate.   There are many forms such as powders, tablets, wafers, gummies, etc Psyllium bran (Metamucil), methylcellulose (Citrucel), Miralax or Glycolax, Benefiber, Flax Seed.  Adjust the dose week-by-week (1/2 dose/day to 6 doses a day) until you are moving your bowels 1-2 times a day.  Cut back the dose or try a different fiber product if it is giving you problems such as diarrhea or bloating. Sometimes a laxative is needed to help jump-start bowels if constipated until the fiber supplement can help regulate your bowels.  If you are tolerating eating & you are farting, it is okay to try a gentle laxative such as double dose MiraLax, prune juice, or Milk of Magnesia.  Avoid using  laxatives too often. Stool softeners can sometimes help counteract the constipating effects of narcotic pain medicines.  It can also cause diarrhea, so avoid using for too long. If you are still constipated despite taking fiber daily, eating solids, and a few doses of laxatives, call our office. Controlling diarrhea Try drinking liquids and eating bland foods for a few days to avoid stressing your intestines further. Avoid dairy products (especially milk & ice cream) for a short time.  The intestines often can lose the ability to digest lactose when stressed. Avoid foods that cause gassiness or bloating.  Typical foods include beans and other legumes, cabbage, broccoli, and dairy foods.  Avoid greasy, spicy, fast foods.  Every person has some sensitivity to other foods, so listen to your body and avoid those foods that trigger problems for you. Probiotics (such as active yogurt, Align, etc) may help repopulate the intestines and colon with normal bacteria and calm down a sensitive digestive tract Adding a fiber supplement gradually can help thicken stools by absorbing excess fluid and retrain the intestines to act more normally.  Slowly increase the dose over a few weeks.  Too much fiber too soon can backfire and cause cramping & bloating. It is okay to try and slow down diarrhea with a few doses of antidiarrheal medicines.   Bismuth subsalicylate (ex. Kayopectate, Pepto Bismol) for a few doses can help control diarrhea.  Avoid if pregnant.   Loperamide (Imodium) can slow down diarrhea.  Start with one tablet (2mg) first.  Avoid if you are having fevers or severe pain.  ILEOSTOMY PATIENTS WILL HAVE CHRONIC DIARRHEA since their colon is not in use.    Drink plenty of liquids.  You will need to drink even more glasses of water/liquid a day to avoid getting dehydrated. Record output from your ileostomy.  Expect to empty the bag every 3-4 hours at first.  Most people with a permanent ileostomy empty their  bag 4-6 times at the least.   Use antidiarrheal medicine (especially Imodium) several times a day to avoid getting dehydrated.  Start with a dose at bedtime & breakfast.  Adjust up or down as needed.  Increase antidiarrheal medications as   directed to avoid emptying the bag more than 8 times a day (every 3 hours). Work with your wound ostomy nurse to learn care for your ostomy.  See ostomy care instructions. TROUBLESHOOTING IRREGULAR BOWELS 1) Start with a soft & bland diet. No spicy, greasy, or fried foods.  2) Avoid gluten/wheat or dairy products from diet to see if symptoms improve. 3) Miralax 17gm or flax seed mixed in 8oz. water or juice-daily. May use 2-4 times a day as needed. 4) Gas-X, Phazyme, etc. as needed for gas & bloating.  5) Prilosec (omeprazole) over-the-counter as needed 6)  Consider probiotics (Align, Activa, etc) to help calm the bowels down  Call your doctor if you are getting worse or not getting better.  Sometimes further testing (cultures, endoscopy, X-ray studies, CT scans, bloodwork, etc.) may be needed to help diagnose and treat the cause of the diarrhea. [facility] [address], [address], [place], [place]  [address] [phone] - Main.    [phone]  - Toll Free.   [phone] - Fax [email or web address]  

## 2022-10-08 ENCOUNTER — Encounter (HOSPITAL_COMMUNITY): Payer: Self-pay | Admitting: Surgery

## 2022-10-08 LAB — CBC
HCT: 36.5 % (ref 36.0–46.0)
Hemoglobin: 12 g/dL (ref 12.0–15.0)
MCH: 29.9 pg (ref 26.0–34.0)
MCHC: 32.9 g/dL (ref 30.0–36.0)
MCV: 90.8 fL (ref 80.0–100.0)
Platelets: 240 10*3/uL (ref 150–400)
RBC: 4.02 MIL/uL (ref 3.87–5.11)
RDW: 13.4 % (ref 11.5–15.5)
WBC: 11.1 10*3/uL — ABNORMAL HIGH (ref 4.0–10.5)
nRBC: 0 % (ref 0.0–0.2)

## 2022-10-08 LAB — BASIC METABOLIC PANEL
Anion gap: 10 (ref 5–15)
BUN: 13 mg/dL (ref 8–23)
CO2: 23 mmol/L (ref 22–32)
Calcium: 8.8 mg/dL — ABNORMAL LOW (ref 8.9–10.3)
Chloride: 101 mmol/L (ref 98–111)
Creatinine, Ser: 0.99 mg/dL (ref 0.44–1.00)
GFR, Estimated: 60 mL/min — ABNORMAL LOW (ref >60)
Glucose, Bld: 144 mg/dL — ABNORMAL HIGH (ref 70–99)
Potassium: 3.7 mmol/L (ref 3.5–5.1)
Sodium: 134 mmol/L — ABNORMAL LOW (ref 135–145)

## 2022-10-08 LAB — MAGNESIUM: Magnesium: 1.7 mg/dL (ref 1.7–2.4)

## 2022-10-08 NOTE — Progress Notes (Signed)
  Transition of Care [facility]) Screening Note   Patient Details  Name: Christy Kirby Date of Birth: 21-Nov-1950   Transition of Care [facility]) CM/SW Contact:    Adrian Prows, RN Phone Number: 10/08/2022, 5:54 PM    Transition of Care Department [facility]) has reviewed patient and no TOC needs have been identified at this time. We will continue to monitor patient advancement through interdisciplinary progression rounds. If new patient transition needs arise, please place a TOC consult.

## 2022-10-08 NOTE — Progress Notes (Signed)
Christy Kirby [id number] 08/12/1950  CARE TEAM:  PCP: Nelwyn Salisbury, MD  Outpatient Care Team: Patient Care Team: Nelwyn Salisbury, MD as PCP - Erline Hau, MD as Consulting Physician (General Surgery) Hilarie Fredrickson, MD as Consulting Physician (Gastroenterology)  Inpatient Treatment Team: Treatment Team: Attending Provider: Karie Soda, MD; Technician: Lyman Speller, NT; Registered Nurse: Vallarie Mare, RN; Technician: Merwyn Katos, NT; Respiratory Therapist: Nunzio Cobbs, RRT; Utilization Review: Deveron Furlong, RN   Problem List:   Principal Problem:   Ileal mass s/p roboric SB resection 10/07/2022   1 Day Post-Op  10/07/2022  POST-OPERATIVE DIAGNOSIS:   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  Not a classic stellate carcinoid but suspicious versus primary cancer.  No obvious metastatic disease on visceral parietal peritoneum or liver.  Long torturous appendix with adhesions -appendectomy done.   It is an isoperistaltic ileo-ileal anastomosis that rests in the pelvic region.  Assessment  Stable  [facility] Stay = 1 days)  Plan:  ERAS protocol.  Advance diet gradually as tolerated.  Follow-up on pathology.  Suspect she has either adenocarcinoma or carcinoid.  Most likely will add on to tumor board for follow-up with medical oncology involvement the cancer center if there is malignancy to figure out if patient needs observation or post adjuvant treatment.  Hard to know until we have diagnosis and staging from the pathology  specimen.  I discussed operative findings, updated the patient's status, discussed probable steps to recovery, and gave postoperative recommendations to the patient.  Recommendations were made.  Questions were answered.  She expressed understanding & appreciation.   -GERD: PPI -Hypertension control.  As needed for now. -VTE prophylaxis- SCDs, etc -mobilize as tolerated to help recovery  Disposition:  Disposition:  The patient is from: Home  Anticipate discharge to:  Home  Anticipated Date of Discharge is:  May 6,2024    Barriers to discharge:  Pending Clinical improvement (more likely than not)  Patient currently is NOT MEDICALLY STABLE for discharge from the hospital from a surgery standpoint.      I reviewed nursing notes, last 24 h vitals and pain scores, last 48 h intake and output, last 24 h labs and trends, and last 24 h imaging results. I have reviewed this patient's available data, including medical history, events of note, test results, etc as part of my evaluation.  A significant portion of that time was spent in counseling.  Care during the described time interval was provided by me.  This care required moderate level of medical decision making.  10/08/2022    Subjective: (Chief complaint)  Patient seen up in bed.  Tolerated clear liquids.  Pain mostly controlled.  Objective:  Vital signs:  Vitals:   10/07/22 2021 10/07/22 2357 10/08/22 0043 10/08/22 0436  BP: (!) 148/85 (!) 142/82 (!) 156/67 126/71  Pulse: 99 (!) 101 79 98  Resp: 16  20 20   Temp: 98.2 F (36.8 C) 98.1 F (36.7 C) 99.5 F (37.5 C) 98.8 F (37.1 C)  TempSrc: Oral Oral Oral Oral  SpO2: 100% 100% 97% 98%  Last BM Date : 10/07/22  Intake/Output   Yesterday:  05/03 0701 - 05/04 0700 In: 2530.2 [P.O.:592; I.V.:1740.1; IV Piggyback:198.1] Out: 2505 [Urine:2475; Blood:30] This shift:  No intake/output data recorded.  Bowel function:  Flatus: No  BM:  No  Drain: (No  drain)   Physical Exam:  General: Pt awake/alert in no acute distress Eyes: PERRL, normal EOM.  Sclera clear.  No icterus Neuro: CN II-XII intact w/o focal sensory/motor deficits. Lymph: No head/neck/groin lymphadenopathy Psych:  No delerium/psychosis/paranoia.  Oriented x 4 HENT: Normocephalic, Mucus membranes moist.  No thrush Neck: Supple, No tracheal deviation.  No obvious thyromegaly Chest: No pain to chest wall compression.  Good respiratory excursion.  No audible wheezing CV:  Pulses intact.  Regular rhythm.  No major extremity edema MS: Normal AROM mjr joints.  No obvious deformity  Abdomen: Soft.  Nondistended.  Mildly tender at incisions only.  No evidence of peritonitis.  No incarcerated hernias.  Ext:   No deformity.  No mjr edema.  No cyanosis Skin: No petechiae / purpurea.  No major sores.  Warm and dry    Results:   Cultures: No results found for this or any previous visit (from the past 720 hour(s)).  Labs: Results for orders placed or performed during the hospital encounter of 10/07/22 (from the past 48 hour(s))  Basic metabolic panel     Status: Abnormal   Collection Time: 10/08/22  6:56 AM  Result Value Ref Range   Sodium 134 (L) 135 - 145 mmol/L   Potassium 3.7 3.5 - 5.1 mmol/L   Chloride 101 98 - 111 mmol/L   CO2 23 22 - 32 mmol/L   Glucose, Bld 144 (H) 70 - 99 mg/dL    Comment: Glucose reference range applies only to samples taken after fasting for at least 8 hours.   BUN 13 8 - 23 mg/dL   Creatinine, Ser 1.61 0.44 - 1.00 mg/dL   Calcium 8.8 (L) 8.9 - 10.3 mg/dL   GFR, Estimated 60 (L) >60 mL/min    Comment: (NOTE) Calculated using the CKD-EPI Creatinine Equation (2021)    Anion gap 10 5 - 15    Comment: Performed at [facility], [address] [place], [place] [address]  CBC     Status: Abnormal   Collection Time: 10/08/22  6:56 AM  Result Value Ref Range   WBC 11.1 (H) 4.0 - 10.5 K/uL   RBC 4.02 3.87 - 5.11 MIL/uL    Hemoglobin 12.0 12.0 - 15.0 g/dL   HCT 54.0 98.1 - 19.1 %   MCV 90.8 80.0 - 100.0 fL   MCH 29.9 26.0 - 34.0 pg   MCHC 32.9 30.0 - 36.0 g/dL   RDW 47.8 29.5 - 62.1 %   Platelets 240 150 - 400 K/uL   nRBC 0.0 0.0 - 0.2 %    Comment: Performed at [facility], [address], [place], [place] [address]  Magnesium     Status: None   Collection Time: 10/08/22  6:56 AM  Result Value Ref Range   Magnesium 1.7 1.7 - 2.4 mg/dL    Comment: Performed at [facility], [address], [place], [place] [address]    Imaging / Studies: No results found.  Medications / Allergies: per chart  Antibiotics: Anti-infectives (From admission, onward)    Start     Dose/Rate Route Frequency Ordered Stop   10/08/22 0100  cefoTEtan (CEFOTAN) 2 g in sodium chloride 0.9 %  100 mL IVPB        2 g 200 mL/hr over 30 Minutes Intravenous Every 12 hours 10/07/22 1716 10/08/22 0153   10/07/22 1115  cefoTEtan (CEFOTAN) 2 g in sodium chloride 0.9 % 100 mL IVPB        2 g 200 mL/hr over 30 Minutes Intravenous On call to O.R. 10/07/22 1109 10/07/22 1258         Note: Portions of this report may have been transcribed using voice recognition software. Every effort was made to ensure accuracy; however, inadvertent computerized transcription errors may be present.   Any transcriptional errors that result from this process are unintentional.    Ardeth Sportsman, MD, FACS, MASCRS Esophageal, Gastrointestinal & Colorectal Surgery Robotic and Minimally Invasive Surgery  [facility] [address], [address] [place], [place] [address] [phone] Fax [phone] Main  CONTACT INFORMATION:  Weekday (9AM-5PM): Call [facility] main office at [phone]  Weeknight (5PM-9AM) or Weekend/Holiday: Check [email or web address] (password " [email or web address]") for General Surgery [facility] coverage  (Please, do not use SecureChat as it is not reliable  communication to reach operating surgeons for immediate patient care given surgeries/outpatient duties/clinic/cross-coverage/off post-call which would lead to a delay in care.  Epic staff messaging available for outptient concerns, but may not be answered for 48 hours or more).     10/08/2022  7:58 AM

## 2022-10-09 MED ORDER — METOCLOPRAMIDE HCL 5 MG/ML IJ SOLN
5.0000 mg | Freq: Three times a day (TID) | INTRAMUSCULAR | Status: DC
  Administered 2022-10-09 (×3): 5 mg via INTRAVENOUS
  Filled 2022-10-09 (×3): qty 2

## 2022-10-09 MED ORDER — LACTATED RINGERS IV BOLUS
1000.0000 mL | Freq: Once | INTRAVENOUS | Status: AC
  Administered 2022-10-09: 1000 mL via INTRAVENOUS

## 2022-10-09 NOTE — Progress Notes (Signed)
Christy Kirby [id number] 10-11-1950  CARE TEAM:  PCP: Nelwyn Salisbury, MD  Outpatient Care Team: Patient Care Team: Nelwyn Salisbury, MD as PCP - Erline Hau, MD as Consulting Physician (General Surgery) Hilarie Fredrickson, MD as Consulting Physician (Gastroenterology)  Inpatient Treatment Team: Treatment Team: Attending Provider: Karie Soda, MD; Technician: Lyman Speller, NT; Registered Nurse: Vallarie Mare, RN; Technician: Merwyn Katos, NT; Utilization Review: Jamal Maes, RN; Case Manager: Redmond Baseman, RN; Mobility Specialist: Lorina Rabon; Pharmacist: Herby Abraham, [facility]   Problem List:   Principal Problem:   Ileal mass s/p robotic SB resection 10/07/2022 Active Problems:   GERD   HTN (hypertension)   History of abdominal pain   2 Days Post-Op  10/07/2022  POST-OPERATIVE DIAGNOSIS:   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  Not a classic stellate carcinoid but suspicious versus primary cancer.  No obvious metastatic disease on visceral parietal peritoneum or liver.  Long torturous appendix with adhesions -appendectomy done.   It is an isoperistaltic ileo-ileal anastomosis that rests in the pelvic region.  Assessment  Persistent ileus with emesis.  [facility] Stay = 2 days)  Plan:  ERAS protocol.  Back down to clear liquids only.  Standing Reglan IV x 48 hours  NG tube if she has recurrent nausea or vomiting.  Follow-up on pathology.  Suspect she has either adenocarcinoma or carcinoid.  Most likely will  add on to tumor board for follow-up with medical oncology involvement the cancer center if there is malignancy to figure out if patient needs observation or post adjuvant treatment.  Hard to know until we have diagnosis and staging from the pathology specimen.  I discussed operative findings, updated the patient's status, discussed probable steps to recovery, and gave postoperative recommendations to the patient.  Recommendations were made.  Questions were answered.  She expressed understanding & appreciation.   -GERD: PPI -Hypertension control.  As needed for now. -VTE prophylaxis- SCDs, etc -mobilize as tolerated to help recovery -strongly encouraged her to walk is much as possible to help her bowels open up sooner.  Disposition:  Disposition:  The patient is from: Home  Anticipate discharge to:  Home  Anticipated Date of Discharge is:  May 9,2024    Barriers to discharge:  Pending Clinical improvement (more likely than not)  Patient currently is NOT MEDICALLY STABLE for discharge from the hospital from a surgery standpoint.      I reviewed nursing notes, last 24 h vitals and pain scores, last 48 h intake and output, last 24 h labs and trends, and last 24 h imaging results. I have reviewed this patient's available data, including medical history, events of note, test results, etc as part of my evaluation.  A significant portion of that time was spent in counseling.  Care during the described time interval was provided by me.  This care required moderate level of medical decision making.  10/09/2022    Subjective: (Chief complaint)  Patient felt more bloated.  Emesis x 1.  Had a little bit of flatus.  Denies severe abdominal pain.  Just  mild soreness.  Walked a couple times but prefers to stay in bed.  Objective:  Vital signs:  Vitals:   10/08/22 1340 10/08/22 2113 10/09/22 0500 10/09/22 0547  BP: 130/78 128/67  (!) 140/77  Pulse: 87 85  93  Resp: 18 20  18   Temp:  98.8 F (37.1 C) 98.9 F (37.2 C)  99 F (37.2 C)  TempSrc: Oral Oral  Oral  SpO2: 99% 98%  96%  Weight:   60.2 kg     Last BM Date : 10/07/22  Intake/Output   Yesterday:  05/04 0701 - 05/05 0700 In: 120 [P.O.:120] Out: 100 [Emesis/NG output:100] This shift:  No intake/output data recorded.  Bowel function:  Flatus: No  BM:  No  Drain: (No drain)   Physical Exam:  General: Pt awake/alert in no acute distress Eyes: PERRL, normal EOM.  Sclera clear.  No icterus Neuro: CN II-XII intact w/o focal sensory/motor deficits. Lymph: No head/neck/groin lymphadenopathy Psych:  No delerium/psychosis/paranoia.  Oriented x 4 HENT: Normocephalic, Mucus membranes moist.  No thrush Neck: Supple, No tracheal deviation.  No obvious thyromegaly Chest: No pain to chest wall compression.  Good respiratory excursion.  No audible wheezing CV:  Pulses intact.  Regular rhythm.  No major extremity edema MS: Normal AROM mjr joints.  No obvious deformity  Abdomen: Soft.  Mildy distended.  Mildly tender at incisions only.  Dressings clean dry and intact.  No evidence of peritonitis.  No incarcerated hernias.  Ext:   No deformity.  No mjr edema.  No cyanosis Skin: No petechiae / purpurea.  No major sores.  Warm and dry    Results:   Cultures: No results found for this or any previous visit (from the past 720 hour(s)).  Labs: Results for orders placed or performed during the hospital encounter of 10/07/22 (from the past 48 hour(s))  Basic metabolic panel     Status: Abnormal   Collection Time: 10/08/22  6:56 AM  Result Value Ref Range   Sodium 134 (L) 135 - 145 mmol/L   Potassium 3.7 3.5 - 5.1 mmol/L   Chloride 101 98 - 111 mmol/L   CO2 23 22 - 32 mmol/L   Glucose, Bld 144 (H) 70 - 99 mg/dL    Comment: Glucose reference range applies only to samples taken after fasting for at least 8 hours.   BUN 13 8 - 23 mg/dL   Creatinine, Ser 1.61 0.44 - 1.00 mg/dL   Calcium 8.8 (L) 8.9 - 10.3  mg/dL   GFR, Estimated 60 (L) >60 mL/min    Comment: (NOTE) Calculated using the CKD-EPI Creatinine Equation (2021)    Anion gap 10 5 - 15    Comment: Performed at [facility], [address], [place], [place] [address]  CBC     Status: Abnormal   Collection Time: 10/08/22  6:56 AM  Result Value Ref Range   WBC 11.1 (H) 4.0 - 10.5 K/uL   RBC 4.02 3.87 - 5.11 MIL/uL   Hemoglobin 12.0 12.0 - 15.0 g/dL   HCT 54.0 98.1 - 19.1 %   MCV 90.8 80.0 - 100.0 fL   MCH 29.9 26.0 - 34.0 pg   MCHC 32.9 30.0 - 36.0 g/dL   RDW 47.8 29.5 - 62.1 %   Platelets 240 150 - 400 K/uL   nRBC 0.0 0.0 - 0.2 %    Comment: Performed at [facility], [address] [place], [place] [address]  Magnesium  Status: None   Collection Time: 10/08/22  6:56 AM  Result Value Ref Range   Magnesium 1.7 1.7 - 2.4 mg/dL    Comment: Performed at [facility], [address], [place], [place] [address]    Imaging / Studies: No results found.  Medications / Allergies: per chart  Antibiotics: Anti-infectives (From admission, onward)    Start     Dose/Rate Route Frequency Ordered Stop   10/08/22 0100  cefoTEtan (CEFOTAN) 2 g in sodium chloride 0.9 % 100 mL IVPB        2 g 200 mL/hr over 30 Minutes Intravenous Every 12 hours 10/07/22 1716 10/08/22 0153   10/07/22 1115  cefoTEtan (CEFOTAN) 2 g in sodium chloride 0.9 % 100 mL IVPB        2 g 200 mL/hr over 30 Minutes Intravenous On call to O.R. 10/07/22 1109 10/07/22 1258         Note: Portions of this report may have been transcribed using voice recognition software. Every effort was made to ensure accuracy; however, inadvertent computerized transcription errors may be present.   Any transcriptional errors that result from this process are unintentional.    Ardeth Sportsman, MD, FACS, MASCRS Esophageal, Gastrointestinal & Colorectal Surgery Robotic and Minimally Invasive Surgery  [facility] [address] [place], [place] [address] [phone] Fax [phone] Main  CONTACT INFORMATION:  Weekday (9AM-5PM): Call [facility] main office at [phone]  Weeknight (5PM-9AM) or Weekend/Holiday: Check [email or web address] (password " [email or web address]") for General Surgery [facility] coverage  (Please, do not use SecureChat as it is not reliable communication to reach operating surgeons for immediate patient care given surgeries/outpatient duties/clinic/cross-coverage/off post-call which would lead to a delay in care.  Epic staff messaging available for outptient concerns, but may not be answered for 48 hours or more).     10/09/2022  8:43 AM

## 2022-10-09 NOTE — Progress Notes (Signed)
Mobility Specialist - Progress Note   10/09/22 0930  Mobility  Activity Ambulated independently in hallway  Level of Assistance Independent  Assistive Device None  Distance Ambulated (ft) 250 ft  Activity Response Tolerated well  Mobility Referral Yes  $Mobility charge 1 Mobility   Pt received in bed and agreeable to mobility. No complaints during session. Pt to bed for meal after session with all needs met.    [facility]

## 2022-10-09 NOTE — Progress Notes (Signed)
Mobility Specialist - Progress Note   10/09/22 1420  Mobility  Activity Ambulated independently in hallway  Level of Assistance Independent  Assistive Device None  Distance Ambulated (ft) 250 ft  Range of Motion/Exercises Active  Activity Response Tolerated well  Mobility Referral Yes  $Mobility charge 1 Mobility   Pt received in bed and agreeable to mobility. No complaints during session. Pt to bed after session with all needs met.    [facility]

## 2022-10-10 LAB — CBC
HCT: 38.9 % (ref 36.0–46.0)
Hemoglobin: 12.4 g/dL (ref 12.0–15.0)
MCH: 30 pg (ref 26.0–34.0)
MCHC: 31.9 g/dL (ref 30.0–36.0)
MCV: 94 fL (ref 80.0–100.0)
Platelets: 241 10*3/uL (ref 150–400)
RBC: 4.14 MIL/uL (ref 3.87–5.11)
RDW: 13.9 % (ref 11.5–15.5)
WBC: 8.3 10*3/uL (ref 4.0–10.5)
nRBC: 0 % (ref 0.0–0.2)

## 2022-10-10 LAB — CREATININE, SERUM
Creatinine, Ser: 0.98 mg/dL (ref 0.44–1.00)
GFR, Estimated: >60 mL/min (ref >60)

## 2022-10-10 LAB — POTASSIUM: Potassium: 4.2 mmol/L (ref 3.5–5.1)

## 2022-10-10 MED ORDER — BISMUTH SUBSALICYLATE 262 MG/15ML PO SUSP: 30.0000 mL | Freq: Three times a day (TID) | ORAL | Status: DC | PRN

## 2022-10-10 MED ORDER — METOCLOPRAMIDE HCL 5 MG/ML IJ SOLN
5.0000 mg | Freq: Three times a day (TID) | INTRAMUSCULAR | Status: DC | PRN
  Administered 2022-10-10 – 2022-10-12 (×2): 10 mg via INTRAVENOUS
  Filled 2022-10-10 (×2): qty 2

## 2022-10-10 MED ORDER — FENTANYL CITRATE PF 50 MCG/ML IJ SOSY: 12.5000 ug | PREFILLED_SYRINGE | INTRAMUSCULAR | Status: DC | PRN

## 2022-10-10 MED ORDER — DIAZEPAM 5 MG/ML IJ SOLN: 2.5000 mg | Freq: Three times a day (TID) | INTRAMUSCULAR | Status: DC | PRN

## 2022-10-10 MED ORDER — SODIUM CHLORIDE 0.9 % IV SOLN: 8.0000 mg | Freq: Four times a day (QID) | INTRAVENOUS | Status: DC | PRN

## 2022-10-10 MED ORDER — ONDANSETRON HCL 4 MG PO TABS
4.0000 mg | ORAL_TABLET | Freq: Three times a day (TID) | ORAL | Status: AC
  Administered 2022-10-10 – 2022-10-11 (×8): 4 mg via ORAL
  Filled 2022-10-10 (×8): qty 1

## 2022-10-10 MED ORDER — ACETAMINOPHEN 500 MG PO TABS
1000.0000 mg | ORAL_TABLET | Freq: Four times a day (QID) | ORAL | Status: DC | PRN
  Administered 2022-10-11: 1000 mg via ORAL
  Filled 2022-10-10: qty 2

## 2022-10-10 MED ORDER — ONDANSETRON HCL 4 MG/2ML IJ SOLN
4.0000 mg | Freq: Four times a day (QID) | INTRAMUSCULAR | Status: DC | PRN
  Administered 2022-10-14 – 2022-10-15 (×3): 4 mg via INTRAVENOUS
  Filled 2022-10-10 (×4): qty 2

## 2022-10-10 NOTE — Progress Notes (Signed)
Pharmacy Brief Note - Alvimopan (Entereg)  The standing order set for alvimopan (Entereg) now includes an automatic order to discontinue the drug after the patient has had a bowel movement. The change was approved by the Pharmacy & Therapeutics Committee and the Medical Executive Committee.  This patient has had a bowel movement documented by nursing. Therefore, alvimopan has been discontinued. If there are questions, please contact the pharmacy at [phone].  Thank you  Bernadene Person, PharmD, BCPS [phone] 10/10/2022, 2:19 PM

## 2022-10-10 NOTE — Progress Notes (Signed)
Mobility Specialist - Progress Note   10/10/22 0918  Mobility  Activity Ambulated with assistance in hallway  Level of Assistance Modified independent, requires aide device or extra time  Assistive Device Front wheel walker  Distance Ambulated (ft) 250 ft  Activity Response Tolerated well  Mobility Referral Yes  $Mobility charge 1 Mobility   Pt received in bed and agreeable to mobility. No complaints during session. Pt to bed after session with all needs met.    [facility]

## 2022-10-10 NOTE — Care Management Important Message (Signed)
Important Message  Patient Details IM Letter given. Name: Alfie Svetlik MRN: [id number] Date of Birth: December 29, 1950   Medicare Important Message Given:  Yes     Caren Macadam 10/10/2022, 2:38 PM

## 2022-10-10 NOTE — Progress Notes (Signed)
Christy Kirby [id number] 26-Nov-1950  CARE TEAM:  PCP: Nelwyn Salisbury, MD  Outpatient Care Team: Patient Care Team: Nelwyn Salisbury, MD as PCP - Erline Hau, MD as Consulting Physician (General Surgery) Hilarie Fredrickson, MD as Consulting Physician (Gastroenterology)  Inpatient Treatment Team: Treatment Team: Attending Provider: Karie Soda, MD; Utilization Review: Theresa Mulligan Raymon Mutton, RN; Mobility Specialist: Lorina Rabon; Registered Nurse: Wilford Corner, RN   Problem List:   Principal Problem:   Ileal mass s/p robotic SB resection 10/07/2022 Active Problems:   GERD   HTN (hypertension)   History of abdominal pain   3 Days Post-Op  10/07/2022  POST-OPERATIVE DIAGNOSIS:   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  Not a classic stellate carcinoid but suspicious versus primary cancer.  No obvious metastatic disease on visceral parietal peritoneum or liver.  Long torturous appendix with adhesions -appendectomy done.   It is an isoperistaltic ileo-ileal anastomosis that rests in the pelvic region.  Assessment  Nausea w ?emesis & ?diarrhea  [facility] Stay = 3 days)  Plan:  ERAS protocol.  Clear liquids only.  NG tube if she has recurrent nausea or vomiting.  Standing Zofran - stop reglan scheduled.  PRN breakthrough nausea meds  Switch nacotics & meds  Follow-up on pathology.  Suspect she has either adenocarcinoma or carcinoid.  Most likely will add on to tumor board for follow-up with medical oncology involvement the cancer  center if there is malignancy to figure out if patient needs observation or post adjuvant treatment.  Hard to know until we have diagnosis and staging from the pathology specimen.  I discussed operative findings, updated the patient's status, discussed probable steps to recovery, and gave postoperative recommendations to the patient.  Recommendations were made.  Questions were answered.  She expressed understanding & appreciation.  -GERD: PPI  -Hypertension control.  As needed for now.  -VTE prophylaxis- SCDs, etc  -mobilize as tolerated to help recovery -strongly encouraged her to walk is much as possible   Disposition:  Disposition:  The patient is from: Home  Anticipate discharge to:  Home  Anticipated Date of Discharge is:  May 9,2024    Barriers to discharge:  Pending Clinical improvement (more likely than not)  Patient currently is NOT MEDICALLY STABLE for discharge from the hospital from a surgery standpoint.      I reviewed nursing notes, last 24 h vitals and pain scores, last 48 h intake and output, last 24 h labs and trends, and last 24 h imaging results. I have reviewed this patient's available data, including medical history, events of note, test results, etc as part of my evaluation.  A significant portion of that time was spent in counseling.  Care during the described time interval was provided by me.  This care required moderate level of medical decision making.  10/10/2022    Subjective: (Chief complaint)  Patient c/o spitting up pills & clears but also "everything runs through me" Did not request nausea meds Walked x2  Objective:  Vital signs:  Vitals:   10/09/22 1345 10/09/22 2033 10/10/22 0500 10/10/22 9563  BP: 118/60 (!) 151/87  (!) 149/85  Pulse: (!) 104 94  92  Resp: 18 17  17   Temp: 99.3 F (37.4 C) 98.5 F (36.9 C)  98.4 F (36.9 C)  TempSrc: Oral   Oral  SpO2: 97% 99%  99%  Weight:   57.8 kg     Last BM Date :  10/07/22  Intake/Output   Yesterday:  05/05 0701 - 05/06 0700 In: 480 [P.O.:480] Out: 100 [Urine:100] This shift:  Total I/O In: 240 [P.O.:240] Out: -   Bowel function:  Flatus: No  BM:  No  Drain: (No drain)   Physical Exam:  General: Pt awake/alert in no acute distress Eyes: PERRL, normal EOM.  Sclera clear.  No icterus Neuro: CN II-XII intact w/o focal sensory/motor deficits. Lymph: No head/neck/groin lymphadenopathy Psych:  No delerium/psychosis/paranoia.  Oriented x 4.  Seems anxious/disappointed but pleasant & appreciative HENT: Normocephalic, Mucus membranes moist.  No thrush Neck: Supple, No tracheal deviation.  No obvious thyromegaly Chest: No pain to chest wall compression.  Good respiratory excursion.  No audible wheezing CV:  Pulses intact.  Regular rhythm.  No major extremity edema MS: Normal AROM mjr joints.  No obvious deformity  Abdomen: Soft.  Mildy distended.  Nontender.  Dressings clean dry and intact.  No evidence of peritonitis.  No incarcerated hernias.  Ext:   No deformity.  No mjr edema.  No cyanosis Skin: No petechiae / purpurea.  No major sores.  Warm and dry    Results:   Cultures: No results found for this or any previous visit (from the past 720 hour(s)).  Labs: Results for orders placed or performed during the hospital encounter of 10/07/22 (from the past 48 hour(s))  Basic metabolic panel     Status: Abnormal   Collection Time: 10/08/22  6:56 AM  Result Value Ref Range   Sodium 134 (L) 135 - 145 mmol/L   Potassium 3.7 3.5 - 5.1 mmol/L   Chloride 101 98 - 111 mmol/L   CO2 23 22 - 32 mmol/L   Glucose, Bld 144 (H) 70 - 99 mg/dL    Comment: Glucose reference range applies only to samples taken after fasting for at least 8 hours.   BUN 13 8 - 23 mg/dL   Creatinine, Ser 9.81 0.44 - 1.00 mg/dL   Calcium 8.8 (L) 8.9 - 10.3 mg/dL   GFR, Estimated 60 (L) >60 mL/min    Comment: (NOTE) Calculated using the CKD-EPI Creatinine Equation  (2021)    Anion gap 10 5 - 15    Comment: Performed at [facility], [address], [place], [place] [address]  CBC     Status: Abnormal   Collection Time: 10/08/22  6:56 AM  Result Value Ref Range   WBC 11.1 (H) 4.0 - 10.5 K/uL   RBC 4.02 3.87 - 5.11 MIL/uL   Hemoglobin 12.0 12.0 - 15.0 g/dL   HCT 82.9 56.2 - 13.0 %   MCV 90.8 80.0 - 100.0 fL   MCH 29.9 26.0 - 34.0 pg   MCHC 32.9 30.0 - 36.0 g/dL   RDW 86.5 78.4 - 69.6 %   Platelets 240 150 - 400 K/uL   nRBC 0.0 0.0 - 0.2 %    Comment: Performed at [facility], [address], [place], [place] [address]  Magnesium     Status: None   Collection Time: 10/08/22  6:56 AM  Result Value Ref Range   Magnesium 1.7 1.7 -  2.4 mg/dL    Comment: Performed at [facility], [address] [place], [place] [address]  CBC     Status: None   Collection Time: 10/10/22  4:52 AM  Result Value Ref Range   WBC 8.3 4.0 - 10.5 K/uL   RBC 4.14 3.87 - 5.11 MIL/uL   Hemoglobin 12.4 12.0 - 15.0 g/dL   HCT 19.1 47.8 - 29.5 %   MCV 94.0 80.0 - 100.0 fL   MCH 30.0 26.0 - 34.0 pg   MCHC 31.9 30.0 - 36.0 g/dL   RDW 62.1 30.8 - 65.7 %   Platelets 241 150 - 400 K/uL   nRBC 0.0 0.0 - 0.2 %    Comment: Performed at [facility], [address], [place], [place] [address]  Potassium     Status: None   Collection Time: 10/10/22  4:52 AM  Result Value Ref Range   Potassium 4.2 3.5 - 5.1 mmol/L    Comment: Performed at [facility], [address], [place], [place] [address]  Creatinine, serum     Status: None   Collection Time: 10/10/22  4:52 AM  Result Value Ref Range   Creatinine, Ser 0.98 0.44 - 1.00 mg/dL   GFR, Estimated >41 >32 mL/min    Comment: (NOTE) Calculated using the CKD-EPI Creatinine Equation (2021) Performed at [facility], [address] [place], [place] [address]     Imaging / Studies: No results  found.  Medications / Allergies: per chart  Antibiotics: Anti-infectives (From admission, onward)    Start     Dose/Rate Route Frequency Ordered Stop   10/08/22 0100  cefoTEtan (CEFOTAN) 2 g in sodium chloride 0.9 % 100 mL IVPB        2 g 200 mL/hr over 30 Minutes Intravenous Every 12 hours 10/07/22 1716 10/08/22 0153   10/07/22 1115  cefoTEtan (CEFOTAN) 2 g in sodium chloride 0.9 % 100 mL IVPB        2 g 200 mL/hr over 30 Minutes Intravenous On call to O.R. 10/07/22 1109 10/07/22 1258         Note: Portions of this report may have been transcribed using voice recognition software. Every effort was made to ensure accuracy; however, inadvertent computerized transcription errors may be present.   Any transcriptional errors that result from this process are unintentional.    Ardeth Sportsman, MD, FACS, MASCRS Esophageal, Gastrointestinal & Colorectal Surgery Robotic and Minimally Invasive Surgery  [facility] [address] [place], [place] [address] [phone] Fax [phone] Main  CONTACT INFORMATION:  Weekday (9AM-5PM): Call [facility] main office at [phone]  Weeknight (5PM-9AM) or Weekend/Holiday: Check [email or web address] (password " [email or web address]") for General Surgery [facility] coverage  (Please, do not use SecureChat as it is not reliable communication to reach operating surgeons for immediate patient care given surgeries/outpatient duties/clinic/cross-coverage/off post-call which would lead to a delay in care.  Epic staff messaging available for outptient concerns, but may not be answered for 48 hours or more).     10/10/2022  6:37 AM

## 2022-10-11 MED ORDER — METOPROLOL TARTRATE 12.5 MG HALF TABLET
12.5000 mg | ORAL_TABLET | Freq: Two times a day (BID) | ORAL | Status: DC
  Administered 2022-10-11 (×2): 12.5 mg via ORAL
  Filled 2022-10-11 (×2): qty 1

## 2022-10-11 NOTE — Progress Notes (Signed)
Christy Kirby [id number] May 26, 1951  CARE TEAM:  PCP: Nelwyn Salisbury, MD  Outpatient Care Team: Patient Care Team: Nelwyn Salisbury, MD as PCP - Erline Hau, MD as Consulting Physician (General Surgery) Hilarie Fredrickson, MD as Consulting Physician (Gastroenterology)  Inpatient Treatment Team: Treatment Team: Attending Provider: Karie Soda, MD; Mobility Specialist: Lorina Rabon; Registered Nurse: Hurshel Party, RN; Technician: Vella Raring, NT; Registered Nurse: Wende Crease, RN   Problem List:   Principal Problem:   Ileal mass s/p robotic SB resection 10/07/2022 Active Problems:   GERD   HTN (hypertension)   History of abdominal pain   4 Days Post-Op  10/07/2022  POST-OPERATIVE DIAGNOSIS:   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  Not a classic stellate carcinoid but suspicious versus primary cancer.  No obvious metastatic disease on visceral parietal peritoneum or liver.  Long torturous appendix with adhesions -appendectomy done.   It is an isoperistaltic ileo-ileal anastomosis that rests in the pelvic region.  Assessment  Ileus seems to be gradually resolving.  [facility] Stay = 4 days)  Plan:  ERAS protocol.  Seems to have less nausea and no emesis.  Hungry for mashed potatoes.  Will try dysphagia 1/full liquid diet.  If tolerates advance to soft diet this evening.  Standing Zofran today.  Most likely auto stop tomorrow 5 8- stop reglan scheduled.  PRN breakthrough nausea meds  Follow-up on pathology.   Suspect she has either adenocarcinoma or carcinoid.  Most likely will add on to tumor board for follow-up with medical oncology involvement the cancer center if there is malignancy to figure out if patient needs observation or post adjuvant treatment.  Hard to know until we have diagnosis and staging from the pathology specimen.  I discussed operative findings, updated the patient's status, discussed probable steps to recovery, and gave postoperative recommendations to the patient.  Recommendations were made.  Questions were answered.  She expressed understanding & appreciation.  -GERD: PPI  -Hypertension control.  Persistently high.  Will return her scheduled metoprolol.  Backup as needed   -VTE prophylaxis- SCDs, etc  -mobilize as tolerated to help recovery -strongly encouraged her to walk is much as possible.  She is feeling more confident in being more up in the chair in the hallways which is reassuring.  Disposition:  Disposition:  The patient is from: Home  Anticipate discharge to:  Home  Anticipated Date of Discharge is:  May 9,2024    Barriers to discharge:  Pending Clinical improvement (more likely than not)  Patient currently is NOT MEDICALLY STABLE for discharge from the hospital from a surgery standpoint.      I reviewed nursing notes, last 24 h vitals and pain scores, last 48 h intake and output, last 24 h labs and trends, and last 24 h imaging results. I have reviewed this patient's available data, including medical history, events of note, test results, etc as part of my evaluation.  A significant portion of that time was spent in counseling.  Care during the described time interval was provided by me.  This care required moderate level  of medical decision making.  10/11/2022    Subjective: (Chief complaint) Patient hungry from mashed potatoes.  Feels like things are rumbling and she is moving her bowels.  Maybe her 1 episode of retching yesterday morning but not  now.  Walked in hallway 6 times per self-report.  Sitting up in chair.  Objective:  Vital signs:  Vitals:   10/10/22 1347 10/10/22 2010 10/10/22 2055 10/11/22 0557  BP: (!) 155/107  (!) 149/87 (!) 156/77  Pulse: (!) 110  99 91  Resp: 16  18 18   Temp: 97.9 F (36.6 C)  98.8 F (37.1 C) 98.5 F (36.9 C)  TempSrc: Oral   Oral  SpO2: 100%  98% 100%  Weight:  57 kg    Height:  5' 5.51" (1.664 m)      Last BM Date : 10/07/22  Intake/Output   Yesterday:  05/06 0701 - 05/07 0700 In: 603 [P.O.:600; I.V.:3] Out: -  This shift:  No intake/output data recorded.  Bowel function:  Flatus: YES  BM:  YES  Drain: (No drain)   Physical Exam:  General: Pt awake/alert in no acute distress Eyes: PERRL, normal EOM.  Sclera clear.  No icterus Neuro: CN II-XII intact w/o focal sensory/motor deficits. Lymph: No head/neck/groin lymphadenopathy Psych:  No delerium/psychosis/paranoia.  Oriented x 4.  Seems anxious/disappointed but pleasant & appreciative HENT: Normocephalic, Mucus membranes moist.  No thrush Neck: Supple, No tracheal deviation.  No obvious thyromegaly Chest: No pain to chest wall compression.  Good respiratory excursion.  No audible wheezing CV:  Pulses intact.  Regular rhythm.  No major extremity edema MS: Normal AROM mjr joints.  No obvious deformity  Abdomen: Soft.  Nondistended.  Nontender.  Incisions clean dry and intact.  No evidence of peritonitis.  No incarcerated hernias.  Ext:   No deformity.  No mjr edema.  No cyanosis Skin: No petechiae / purpurea.  No major sores.  Warm and dry    Results:   Cultures: No results found for this or any previous visit (from the past 720 hour(s)).  Labs: Results for orders placed or performed during the hospital encounter of 10/07/22 (from the past 48 hour(s))  CBC     Status: None   Collection Time: 10/10/22  4:52 AM  Result Value Ref Range   WBC 8.3 4.0 - 10.5 K/uL   RBC 4.14 3.87 - 5.11 MIL/uL   Hemoglobin 12.4  12.0 - 15.0 g/dL   HCT 16.1 09.6 - 04.5 %   MCV 94.0 80.0 - 100.0 fL   MCH 30.0 26.0 - 34.0 pg   MCHC 31.9 30.0 - 36.0 g/dL   RDW 40.9 81.1 - 91.4 %   Platelets 241 150 - 400 K/uL   nRBC 0.0 0.0 - 0.2 %    Comment: Performed at [facility], [address] [place], [place] [address]  Potassium     Status: None   Collection Time: 10/10/22  4:52 AM  Result Value Ref Range   Potassium 4.2 3.5 - 5.1 mmol/L    Comment: Performed at [facility], [address] [place], [place] [address]  Creatinine, serum     Status: None   Collection Time: 10/10/22  4:52 AM  Result Value Ref Range   Creatinine, Ser 0.98 0.44 - 1.00 mg/dL   GFR, Estimated >86 >57 mL/min    Comment: (NOTE) Calculated using the CKD-EPI Creatinine Equation (2021) Performed at [facility], [address] [place],  [place] [address]     Imaging / Studies: No results found.  Medications / Allergies: per chart  Antibiotics: Anti-infectives (From admission, onward)    Start     Dose/Rate Route Frequency Ordered Stop   10/08/22 0100  cefoTEtan (CEFOTAN) 2 g in sodium chloride 0.9 % 100 mL IVPB        2 g 200 mL/hr over 30 Minutes Intravenous Every 12 hours 10/07/22 1716 10/08/22 0153   10/07/22 1115  cefoTEtan (CEFOTAN) 2 g in sodium chloride 0.9 % 100 mL IVPB        2 g 200 mL/hr over 30 Minutes Intravenous On call to O.R. 10/07/22 1109 10/07/22 1258         Note: Portions of this report may have been transcribed using voice recognition software. Every effort was made to ensure accuracy; however, inadvertent computerized transcription errors may be present.   Any transcriptional errors that result from this process are unintentional.    Ardeth Sportsman, MD, FACS, MASCRS Esophageal, Gastrointestinal & Colorectal Surgery Robotic and Minimally Invasive Surgery  [facility] [address], [address] [place], [place] [address] [phone] Fax [phone] Main  CONTACT INFORMATION:  Weekday (9AM-5PM): Call [facility] main office at [phone]  Weeknight (5PM-9AM) or Weekend/Holiday: Check [email or web address] (password " [email or web address]") for General Surgery [facility] coverage  (Please, do not use SecureChat as it is not reliable communication to reach operating surgeons for immediate patient care given surgeries/outpatient duties/clinic/cross-coverage/off post-call which would lead to a delay in care.  Epic staff messaging available for outptient concerns, but may not be answered for 48 hours or more).     10/11/2022  8:00 AM

## 2022-10-11 NOTE — Progress Notes (Signed)
Mobility Specialist - Progress Note   10/11/22 0919  Mobility  Activity Ambulated with assistance in hallway  Level of Assistance Standby assist, set-up cues, supervision of patient - no hands on  Assistive Device Front wheel walker  Distance Ambulated (ft) 500 ft  Activity Response Tolerated well  Mobility Referral Yes  $Mobility charge 1 Mobility  Mobility Specialist Stop Time (ACUTE ONLY) 0919   Pt received in recliner and agreeable to mobility. No complaints during session. Pt to EOB after session with all needs met.    [facility]

## 2022-10-12 DIAGNOSIS — B9681 Helicobacter pylori [H. pylori] as the cause of diseases classified elsewhere: Secondary | ICD-10-CM | POA: Insufficient documentation

## 2022-10-12 LAB — CBC
HCT: 38.9 % (ref 36.0–46.0)
Hemoglobin: 12.6 g/dL (ref 12.0–15.0)
MCH: 29.6 pg (ref 26.0–34.0)
MCHC: 32.4 g/dL (ref 30.0–36.0)
MCV: 91.5 fL (ref 80.0–100.0)
Platelets: 295 10*3/uL (ref 150–400)
RBC: 4.25 MIL/uL (ref 3.87–5.11)
RDW: 13.3 % (ref 11.5–15.5)
WBC: 5.1 10*3/uL (ref 4.0–10.5)
nRBC: 0 % (ref 0.0–0.2)

## 2022-10-12 LAB — BASIC METABOLIC PANEL
Anion gap: 10 (ref 5–15)
BUN: 19 mg/dL (ref 8–23)
CO2: 25 mmol/L (ref 22–32)
Calcium: 9.4 mg/dL (ref 8.9–10.3)
Chloride: 103 mmol/L (ref 98–111)
Creatinine, Ser: 0.92 mg/dL (ref 0.44–1.00)
GFR, Estimated: >60 mL/min (ref >60)
Glucose, Bld: 107 mg/dL — ABNORMAL HIGH (ref 70–99)
Potassium: 3.8 mmol/L (ref 3.5–5.1)
Sodium: 138 mmol/L (ref 135–145)

## 2022-10-12 LAB — MAGNESIUM: Magnesium: 1.8 mg/dL (ref 1.7–2.4)

## 2022-10-12 MED ORDER — METHOCARBAMOL 500 MG PO TABS: 500.0000 mg | ORAL_TABLET | Freq: Four times a day (QID) | ORAL | Status: DC | PRN

## 2022-10-12 MED ORDER — METOPROLOL TARTRATE 25 MG PO TABS
25.0000 mg | ORAL_TABLET | Freq: Two times a day (BID) | ORAL | Status: DC
  Administered 2022-10-12 – 2022-10-15 (×7): 25 mg via ORAL
  Filled 2022-10-12 (×7): qty 1

## 2022-10-12 MED ORDER — ONDANSETRON HCL 4 MG PO TABS
4.0000 mg | ORAL_TABLET | Freq: Three times a day (TID) | ORAL | Status: AC
  Administered 2022-10-12 – 2022-10-13 (×4): 4 mg via ORAL
  Filled 2022-10-12 (×4): qty 1

## 2022-10-12 NOTE — Progress Notes (Signed)
Christy Kirby [id number] 08/18/1950  CARE TEAM:  PCP: Nelwyn Salisbury, MD  Outpatient Care Team: Patient Care Team: Nelwyn Salisbury, MD as PCP - Erline Hau, MD as Consulting Physician (General Surgery) Hilarie Fredrickson, MD as Consulting Physician (Gastroenterology)  Inpatient Treatment Team: Treatment Team: Attending Provider: Karie Soda, MD; Mobility Specialist: Lorina Rabon; Charge Nurse: Hilliard Clark, RN; Registered Nurse: Dewaine Conger, RN; Nursing Instructor: Orlin Hilding, RN; Registered Nurse: Reginal Lutes, Elveria Rising, RN   Problem List:   Principal Problem:   Ileal mass s/p robotic SB resection 10/07/2022 Active Problems:   GERD   HTN (hypertension)   History of abdominal pain   5 Days Post-Op  10/07/2022  POST-OPERATIVE DIAGNOSIS:   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  Not a classic stellate carcinoid but suspicious versus primary cancer.  No obvious metastatic disease on visceral parietal peritoneum or liver.  Long torturous appendix with adhesions -appendectomy done.   It is an isoperistaltic ileo-ileal anastomosis that rests in the pelvic region.  Assessment  Ileus seems to be intermittently resolving.  [facility] Stay = 5 days)  Plan:  ERAS protocol.  Tolerating p.o. little better but she is frustrated that she is not normal yet.  Will keep on dysphagia 1/full liquid diet.  Perhaps can readvanced later today if she is better.    Standing Zofran another day.  Most likely auto stop tomorrow 5/9.  PRN  breakthrough nausea meds  Soluble fiber bowel regimen to encourage natural return of function.  Follow-up on pathology.  Suspect she has either adenocarcinoma or carcinoid.  Most likely will add on to tumor board for follow-up with medical oncology involvement the cancer center if there is malignancy to figure out if patient needs observation or post adjuvant treatment.  Hard to know until we have diagnosis and staging from the pathology specimen.  I discussed operative findings, updated the patient's status, discussed probable steps to recovery, and gave postoperative recommendations to the patient.  Recommendations were made.  Questions were answered.  She expressed understanding & appreciation.  -GERD with history of H. pylori gastritis treated with Pylera January - February 2024.  Followed by [facility] gastroenterology.  With persistent nausea will keep at least on low-dose PPI  -Hypertension control.  Persistently high.  Restarted metoprolol 5/7.  Will increase dose gradually.  Backup as needed   -VTE prophylaxis- SCDs, etc  -mobilize as tolerated to help recovery -strongly encouraged her to walk is much as possible.  She is feeling more confident in being more up in the chair in the hallways which is reassuring.  Disposition:  Disposition:  The patient is from: Home  Anticipate discharge to:  Home  Anticipated Date of Discharge is:  Oct 14, 2022    Barriers to discharge:  Pending Clinical improvement (more likely than not)  Patient currently is NOT MEDICALLY STABLE for discharge from the hospital from a surgery standpoint.      I reviewed nursing notes, last 24 h vitals and pain scores, last 48 h intake and output, last 24 h labs and trends, and last 24 h imaging  results. I have reviewed this patient's available data, including medical history, events of note, test results, etc as part of my evaluation.  A significant portion of that time was spent in counseling.  Care during the  described time interval was provided by me.  This care required moderate level of medical decision making.  10/12/2022    Subjective: (Chief complaint)  Patient tolerated breakfast and lunch but then threw up dinner.  A little disheartened.  No nausea or vomiting now.  Nausea medicine helped.  Having flatus.  Had small unsatisfying bowel movement yesterday.  Denies much pain.  Objective:  Vital signs:  Vitals:   10/11/22 0557 10/11/22 1351 10/11/22 2021 10/12/22 0458  BP: (!) 156/77 (!) 131/90 (!) 158/95 (!) 161/86  Pulse: 91 91 (!) 102 90  Resp: 18 18 17 17   Temp: 98.5 F (36.9 C) 98.7 F (37.1 C) 99.6 F (37.6 C) 98.3 F (36.8 C)  TempSrc: Oral Oral Oral Oral  SpO2: 100% 100% 100% 98%  Weight:    56.1 kg  Height:        Last BM Date : 10/07/22  Intake/Output   Yesterday:  05/07 0701 - 05/08 0700 In: 240 [P.O.:240] Out: 975 [Urine:975] This shift:  No intake/output data recorded.  Bowel function:  Flatus: YES  BM:   scant  Drain: (No drain)   Physical Exam:  General: Pt awake/alert in no acute distress Eyes: PERRL, normal EOM.  Sclera clear.  No icterus Neuro: CN II-XII intact w/o focal sensory/motor deficits. Lymph: No head/neck/groin lymphadenopathy Psych:  No delerium/psychosis/paranoia.  Oriented x 4.  Seems anxious/disappointed but pleasant & appreciative HENT: Normocephalic, Mucus membranes moist.  No thrush Neck: Supple, No tracheal deviation.  No obvious thyromegaly Chest: No pain to chest wall compression.  Good respiratory excursion.  No audible wheezing CV:  Pulses intact.  Regular rhythm.  No major extremity edema MS: Normal AROM mjr joints.  No obvious deformity  Abdomen: Soft.  Nondistended.  Mildly tender at incisions only.  Incisions clean dry and intact.  No evidence of peritonitis.  No incarcerated hernias.  Ext:   No deformity.  No mjr edema.  No cyanosis Skin: No petechiae / purpurea.  No major sores.  Warm and  dry    Results:   Cultures: No results found for this or any previous visit (from the past 720 hour(s)).  Labs: No results found for this or any previous visit (from the past 48 hour(s)).   Imaging / Studies: No results found.  Medications / Allergies: per chart  Antibiotics: Anti-infectives (From admission, onward)    Start     Dose/Rate Route Frequency Ordered Stop   10/08/22 0100  cefoTEtan (CEFOTAN) 2 g in sodium chloride 0.9 % 100 mL IVPB        2 g 200 mL/hr over 30 Minutes Intravenous Every 12 hours 10/07/22 1716 10/08/22 0153   10/07/22 1115  cefoTEtan (CEFOTAN) 2 g in sodium chloride 0.9 % 100 mL IVPB        2 g 200 mL/hr over 30 Minutes Intravenous On call to O.R. 10/07/22 1109 10/07/22 1258         Note: Portions of this report may have been transcribed using voice recognition software. Every effort was made to ensure accuracy; however, inadvertent computerized transcription errors may be present.   Any transcriptional errors that result from this process are unintentional.    Ardeth Sportsman, MD, FACS, MASCRS Esophageal, Gastrointestinal & Colorectal Surgery Robotic and  Minimally Invasive Surgery  [facility] [address] [place], [place] [address] [phone] Fax [phone] Main  CONTACT INFORMATION:  Weekday (9AM-5PM): Call [facility] main office at [phone]  Weeknight (5PM-9AM) or Weekend/Holiday: Check [email or web address] (password " [email or web address]") for General Surgery [facility] coverage  (Please, do not use SecureChat as it is not reliable communication to reach operating surgeons for immediate patient care given surgeries/outpatient duties/clinic/cross-coverage/off post-call which would lead to a delay in care.  Epic staff messaging available for outptient concerns, but may not be answered for 48 hours or more).     10/12/2022  7:12 AM

## 2022-10-13 NOTE — Progress Notes (Signed)
GI Tumor Board patient referral:   Christy Kirby  12-27-1950 [id number]  CARE TEAM: Patient Care Team: Nelwyn Salisbury, MD as PCP - Erline Hau, MD as Consulting Physician (General Surgery) Hilarie Fredrickson, MD as Consulting Physician (Gastroenterology)  Patient recurrent episodes of bowel obstruction suspected mass.  I did a robotic small bowel resection of her ileum.  Suspected to be a tumor.  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  [name].  MD Care Team:  Karie Soda, MD - surgery and Gastroenterology:  Yancey Flemings  Focus of discussion: Radiology & Pathology reviews - comprehensive   Please send to GI Tumor Coordinator [name])  in [name] and attach the medical record to it  Spec patient would benefit from getting plugged into the [facility] health cancer [facility] for further workup and survival pathway.  If Dr. Truett Perna Dr. Mosetta Putt could see the patient.  Do not know if they would do much more beyond observation vs study vs Tx

## 2022-10-13 NOTE — Progress Notes (Addendum)
Christy Kirby [id number] 1950/12/29  CARE TEAM:  PCP: Nelwyn Salisbury, MD  Outpatient Care Team: Patient Care Team: Nelwyn Salisbury, MD as PCP - Erline Hau, MD as Consulting Physician (General Surgery) Hilarie Fredrickson, MD as Consulting Physician (Gastroenterology)  Inpatient Treatment Team: Treatment Team: Attending Provider: Karie Soda, MD; Mobility Specialist: Lorina Rabon; Technician: Fuller Canada, NT; Registered Nurse: Ivan Anchors, RN   Problem List:   Principal Problem:   Ileal mass s/p robotic SB resection 10/07/2022 Active Problems:   GERD   HTN (hypertension)   History of abdominal pain   Helicobacter pylori gastritis   6 Days Post-Op  10/07/2022  POST-OPERATIVE DIAGNOSIS:   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  Not a classic stellate carcinoid but suspicious versus primary cancer.  No obvious metastatic disease on visceral parietal peritoneum or liver.  Long torturous appendix with adhesions -appendectomy done.   It is an isoperistaltic ileo-ileal anastomosis that rests in the pelvic region.  Assessment  Ileus seems to be intermittently resolving.  [facility] Stay = 6 days)  Plan:  ERAS protocol.  No nausea or vomiting.  Being more flatus.  Afraid to take p.o. but tolerated some thicker liquids.  Will try soft diet.      Standing Zofran another day.  Most likely auto stop tomorrow 5/9.  PRN breakthrough nausea meds  Soluble fiber bowel regimen to encourage natural return of function.  Follow-up  on pathology.  Suspect she has either adenocarcinoma or carcinoid.  Most likely will add on to tumor board for follow-up with medical oncology involvement the cancer center if there is malignancy to figure out if patient needs observation or post adjuvant treatment.  Hard to know until we have diagnosis and staging from the pathology specimen.  I discussed operative findings, updated the patient's status, discussed probable steps to recovery, and gave postoperative recommendations to the patient.  Recommendations were made.  Questions were answered.  She expressed understanding & appreciation.  -GERD with history of H. pylori gastritis treated with Pylera January - February 2024.  Followed by [facility] gastroenterology.  With persistent nausea will keep at least on low-dose PPI  -Hypertension control.  Persistently high.  Restarted metoprolol 5/7.  Will increase dose gradually.  Backup as needed   -VTE prophylaxis- SCDs, etc  -mobilize as tolerated to help recovery -strongly encouraged her to walk is much as possible.  She is feeling more confident in being more up in the chair in the hallways which is reassuring.  Disposition:  Disposition:  The patient is from: Home  Anticipate discharge to:  Home  Anticipated Date of Discharge is:  Oct 14, 2022    Barriers to discharge:  Pending Clinical improvement (more likely than not)  Patient currently is NOT MEDICALLY STABLE for discharge from the hospital from a surgery standpoint.      I reviewed nursing notes, last 24 h vitals and pain scores, last 48 h intake and output, last 24 h labs and trends, and last 24 h imaging results. I have reviewed this patient's available data, including medical history, events  of note, test results, etc as part of my evaluation.  A significant portion of that time was spent in counseling.  Care during the described time interval was provided by me.  This care required moderate level of medical decision making.   10/13/2022    Subjective: (Chief complaint)  Patient with no nausea or vomiting.  Scared to eat or drink but confesses she was able to keep stuff down fine.  Denies any abdominal pain.  Walking better.  Having more flatus.  Objective:  Vital signs:  Vitals:   10/12/22 0458 10/12/22 1318 10/12/22 2031 10/13/22 0605  BP: (!) 161/86 (!) 161/90 (!) 167/94 (!) 148/94  Pulse: 90 82 93 84  Resp: 17 18 18 18   Temp: 98.3 F (36.8 C) 98.2 F (36.8 C) 97.8 F (36.6 C) 98.1 F (36.7 C)  TempSrc: Oral Oral Oral Oral  SpO2: 98% 100% 99% 98%  Weight: 56.1 kg     Height:        Last BM Date : 10/10/22  Intake/Output   Yesterday:  05/08 0701 - 05/09 0700 In: 725 [P.O.:720; I.V.:5] Out: 650 [Urine:650] This shift:  No intake/output data recorded.  Bowel function:  Flatus: YES  BM:   scant  Drain: (No drain)   Physical Exam:  General: Pt awake/alert in no acute distress.  Not toxic nor sickly. Eyes: PERRL, normal EOM.  Sclera clear.  No icterus Neuro: CN II-XII intact w/o focal sensory/motor deficits. Lymph: No head/neck/groin lymphadenopathy Psych:  No delerium/psychosis/paranoia.  Oriented x 4.  Remains somewhat anxious but consolable.  Not tearful.   HENT: Normocephalic, Mucus membranes moist.  No thrush Neck: Supple, No tracheal deviation.  No obvious thyromegaly Chest: No pain to chest wall compression.  Good respiratory excursion.  No audible wheezing CV:  Pulses intact.  Regular rhythm.  No major extremity edema MS: Normal AROM mjr joints.  No obvious deformity  Abdomen: Soft.  Nondistended.  Nontender.  Incisions clean dry and intact.  No evidence of peritonitis.  No incarcerated hernias.  Ext:   No deformity.  No mjr edema.  No cyanosis Skin: No petechiae / purpurea.  No major sores.  Warm and dry    Results:   Cultures: No results found for this or any previous visit (from the past 720 hour(s)).  Labs: Results for orders placed or performed during  the hospital encounter of 10/07/22 (from the past 48 hour(s))  Basic metabolic panel     Status: Abnormal   Collection Time: 10/12/22  8:18 AM  Result Value Ref Range   Sodium 138 135 - 145 mmol/L   Potassium 3.8 3.5 - 5.1 mmol/L   Chloride 103 98 - 111 mmol/L   CO2 25 22 - 32 mmol/L   Glucose, Bld 107 (H) 70 - 99 mg/dL    Comment: Glucose reference range applies only to samples taken after fasting for at least 8 hours.   BUN 19 8 - 23 mg/dL   Creatinine, Ser 4.54 0.44 - 1.00 mg/dL   Calcium 9.4 8.9 - 09.8 mg/dL   GFR, Estimated >11 >91 mL/min    Comment: (NOTE) Calculated using the CKD-EPI Creatinine Equation (2021)    Anion gap 10 5 - 15    Comment: Performed at [facility], [address], [place], [place] [address]  Magnesium     Status: None   Collection Time: 10/12/22  8:18 AM  Result Value Ref Range   Magnesium 1.8 1.7 - 2.4 mg/dL  Comment: Performed at [facility], [address], [place], [place] [address]  CBC     Status: None   Collection Time: 10/12/22  8:18 AM  Result Value Ref Range   WBC 5.1 4.0 - 10.5 K/uL   RBC 4.25 3.87 - 5.11 MIL/uL   Hemoglobin 12.6 12.0 - 15.0 g/dL   HCT 60.4 54.0 - 98.1 %   MCV 91.5 80.0 - 100.0 fL   MCH 29.6 26.0 - 34.0 pg   MCHC 32.4 30.0 - 36.0 g/dL   RDW 19.1 47.8 - 29.5 %   Platelets 295 150 - 400 K/uL   nRBC 0.0 0.0 - 0.2 %    Comment: Performed at [facility], [address] [place], [place] [address]     Imaging / Studies: No results found.  Medications / Allergies: per chart  Antibiotics: Anti-infectives (From admission, onward)    Start     Dose/Rate Route Frequency Ordered Stop   10/08/22 0100  cefoTEtan (CEFOTAN) 2 g in sodium chloride 0.9 % 100 mL IVPB        2 g 200 mL/hr over 30 Minutes Intravenous Every 12 hours 10/07/22 1716 10/08/22 0153   10/07/22 1115  cefoTEtan (CEFOTAN) 2 g in sodium chloride 0.9 % 100 mL IVPB        2 g 200 mL/hr over 30  Minutes Intravenous On call to O.R. 10/07/22 1109 10/07/22 1258         Note: Portions of this report may have been transcribed using voice recognition software. Every effort was made to ensure accuracy; however, inadvertent computerized transcription errors may be present.   Any transcriptional errors that result from this process are unintentional.    Ardeth Sportsman, MD, FACS, MASCRS Esophageal, Gastrointestinal & Colorectal Surgery Robotic and Minimally Invasive Surgery  [facility] [address], [address] [place], [place] [address] [phone] Fax [phone] Main  CONTACT INFORMATION:  Weekday (9AM-5PM): Call [facility] main office at [phone]  Weeknight (5PM-9AM) or Weekend/Holiday: Check [email or web address] (password " [email or web address]") for General Surgery [facility] coverage  (Please, do not use SecureChat as it is not reliable communication to reach operating surgeons for immediate patient care given surgeries/outpatient duties/clinic/cross-coverage/off post-call which would lead to a delay in care.  Epic staff messaging available for outptient concerns, but may not be answered for 48 hours or more).     10/13/2022  7:08 AM

## 2022-10-14 LAB — CREATININE, SERUM
Creatinine, Ser: 0.85 mg/dL (ref 0.44–1.00)
GFR, Estimated: >60 mL/min (ref >60)

## 2022-10-14 NOTE — Progress Notes (Signed)
Christy Kirby [id number] [birth year]  CARE TEAM:  PCP: Christy Salisbury, MD  Outpatient Care Team: Patient Care Team: Christy Salisbury, MD as PCP - Christy Hau, MD as Consulting Physician (General Surgery) Christy Fredrickson, MD as Consulting Physician (Gastroenterology)  Inpatient Treatment Team: Treatment Team: Attending Provider: Karie Soda, MD; Mobility Specialist: Christy Kirby; Student Nurse: Christy Kirby, Student-RN; Registered Nurse: Christy Clock, RN   Problem List:   Principal Problem:   Ileal mass s/p robotic SB resection 10/07/2022 Active Problems:   GERD   HTN (hypertension)   History of abdominal pain   Helicobacter pylori gastritis   7 Days Post-Op  10/07/2022  POST-OPERATIVE DIAGNOSIS:   MASS IN SMALL INTESTINE, PROBABLE CARCINOID Christy Kirby SYNDROME   PROCEDURE:   -ROBOTIC ILEAL RESECTION -ROBOTIC APPENDECTOMY -ROBOTIC LYSIS OF ADHESIONS -INTRAOPERATIVE ASSESSMENT OF TISSUE VASCULAR PERFUSION USING ICG (indocyanine green) IMMUNOFLUORESCENCE -TRANSVERSUS ABDOMINIS PLANE (TAP) BLOCK - BILATERAL   SURGEON:  Christy Sportsman, MD  OR FINDINGS:    Patient had mass in mid ileum thickening causing near obstruction with nodularity in the mesentery.  Not a classic stellate carcinoid but suspicious versus primary cancer.  No obvious metastatic disease on visceral parietal peritoneum or liver.  Long torturous appendix with adhesions -appendectomy done.   It is an isoperistaltic ileo-ileal anastomosis that rests in the pelvic region.  FINAL MICROSCOPIC DIAGNOSIS:  A. VERIFORM APPENDIX, APPENDECTOMY: - Benign appendix with no specific histopathologic changes  B. ILEUM MASS, RESECTION: - Well-differentiated neuroendocrine tumor (G1; low-grade), 1.5 cm, involving ileum - Tumor involves the serosal surface - Resection margins are negative for tumor - Metastatic carcinoma to two thank you of three lymph nodes (2/3) -  Mesenteric tumor deposit, 2.7 cm - Pathologic Stage Classification (pTNM, AJCC 8th Edition): pT4, pN1   Assessment  Ileus seems to be intermittently resolving.  [facility] Stay = 7 days)  Plan:  ERAS protocol.  No nausea or vomiting.  Being more flatus.  Afraid to take p.o. but tolerated liquids or soft diet.  Will advance diet.  Hopefully if tolerates solid diet today without any more emesis issues safe to discharge tomorrow.  Follow-up off standing nausea medications.  PRN breakthrough nausea meds  Soluble fiber bowel regimen to encourage natural return of function.  As suspected, pathology consistent with neuroendocrine carcinoid tumor.  Appears well-differentiated.  Will have her follow-up with the [facility] and medical oncology for survival pathway.  I was able to curbside Dr. Truett Kirby today.  He noted usually the are so slow-growing that they do not need aggressive treatment or intervention but just some monitoring.  He or his partner Dr. Mosetta Kirby will help follow in come up with a survival pathway.    -GERD with history of H. pylori gastritis treated with Pylera January - February 2024.  Followed by [facility] gastroenterology.  With persistent nausea will keep at least on low-dose PPI  -Hypertension control.  Persistently high.  Restarted metoprolol 5/7.  Will increase dose gradually.  Backup as needed   -VTE prophylaxis- SCDs, etc  -mobilize as tolerated to help recovery -strongly encouraged her to walk is much as possible.  She is feeling more confident in being more up in the chair in the hallways which is reassuring.  Disposition:  Disposition:  The patient is from: Home  Anticipate discharge to:  Home  Anticipated Date of Discharge is:  Oct 15, 2022   Barriers to discharge:  Pending Clinical improvement (more  likely than not)  Patient currently is NOT MEDICALLY STABLE for discharge from the hospital from a surgery standpoint.      I reviewed nursing  notes, last 24 h vitals and pain scores, last 48 h intake and output, last 24 h labs and trends, and last 24 h imaging results. I have reviewed this patient's available data, including medical history, events of note, test results, etc as part of my evaluation.  A significant portion of that time was spent in counseling.  Care during the described time interval was provided by me.  This care required moderate level of medical decision making.  10/14/2022    Subjective: (Chief complaint)  Patient tolerated most soft foods.  Spit up once.  Had more bowel movements still feeling a little better.  Is much pain.  Nausea less.  Objective:  Vital signs:  Vitals:   10/13/22 0900 10/13/22 1321 10/13/22 2053 10/14/22 0548  BP:  120/81 (!) 142/95 135/80  Pulse:  81 88 83  Resp:   18 18  Temp:  98.2 F (36.8 C) 98.2 F (36.8 C) 97.6 F (36.4 C)  TempSrc:  Oral Oral Oral  SpO2:  99% 100% 99%  Weight: 59.5 kg     Height:        Last BM Date : 10/13/22  Intake/Output   Yesterday:  05/09 0701 - 05/10 0700 In: 723 [P.O.:720; I.V.:3] Out: 600 [Urine:600] This shift:  Total I/O In: -  Out: 600 [Urine:600]  Bowel function:  Flatus: YES  BM:  YES  Drain: (No drain)   Physical Exam:  General: Pt awake/alert in no acute distress.  Not toxic nor sickly. Eyes: PERRL, normal EOM.  Sclera clear.  No icterus Neuro: CN II-XII intact w/o focal sensory/motor deficits. Lymph: No head/neck/groin lymphadenopathy Psych:  No delerium/psychosis/paranoia.  Oriented x 4.  Remains somewhat anxious but consolable.  Not tearful.   HENT: Normocephalic, Mucus membranes moist.  No thrush Neck: Supple, No tracheal deviation.  No obvious thyromegaly Chest: No pain to chest wall compression.  Good respiratory excursion.  No audible wheezing CV:  Pulses intact.  Regular rhythm.  No major extremity edema MS: Normal AROM mjr joints.  No obvious deformity  Abdomen: Soft.  Nondistended.  Nontender.   Incisions clean dry and intact.  No evidence of peritonitis.  No incarcerated hernias.  Ext:   No deformity.  No mjr edema.  No cyanosis Skin: No petechiae / purpurea.  No major sores.  Warm and dry    Results:    SURGICAL PATHOLOGY CASE: [id number] PATIENT: Christy Kirby Surgical Pathology Report     Clinical History: Mass in small intestine, probable carcinoid (crm)     FINAL MICROSCOPIC DIAGNOSIS:  A. VERIFORM APPENDIX, APPENDECTOMY: - Benign appendix with no specific histopathologic changes  B. ILEUM MASS, RESECTION: - Well-differentiated neuroendocrine tumor (G1; low-grade), 1.5 cm, involving ileum - Tumor involves the serosal surface - Resection margins are negative for tumor - Metastatic carcinoma to two thank you of three lymph nodes (2/3) - Mesenteric tumor deposit, 2.7 cm - See oncology table      ONCOLOGY TABLE:  JEJUNUM AND ILEUM NEUROENDOCRINE TUMOR: Resection  Procedure: Resection, ileum Tumor Site: Ileum Tumor Size: 1.5 cm Tumor Focality: Unifocal Histologic Type and Grade: Low-grade (G1)      Mitotic Rate: 1/10 HPF      Ki-67 Labeling Index: Approximately 1% Tumor Extension: Tumor invades into serosal surface Lymphovascular Invasion: Not identified Large Mesenteric Masses (>2 cm): Present Margins:  All margins negative for tumor Regional Lymph Nodes:      Number of Lymph Nodes with Tumor: 1      Number of Lymph Nodes Examined: 2 Distant metastasis:      Distant Site(s) Involved: Not applicable Pathologic Stage Classification (pTNM, AJCC 8th Edition): pT4, pN1 Ancillary Studies: Can be performed upon request Representative Tumor Block: B4 Comment(s): None (v1.1.0.0)      Connie Lasater DESCRIPTION:  Specimen A: Received fresh is a 9.7 cm in length and averaging 0.6 cm in diameter appendix which has a tan-pink smooth serosa and attached soft fatty tissue.  Cut surfaces are unremarkable.  Representative sections are submitted in 1  block.  Specimen B: Received fresh is a 23.5 cm unoriented length of intestine grossly consistent with small bowel and clinically identified as ileum. The serosa has an area of pale yellow indurated nodularity with umbilication 8.5 cm from 1 margin.  There is also attached mesentery which is focally involved with firm nodular tissue near the area of nodular umbilicated serosa.  On opening, there is a 1.5 cm in diameter and up to 0.8 cm thick yellow-white firm ill-defined intramural mass which has overlying pink-red smooth mucosa.  The mass is 8 and 14 cm from the margins of resection.  Remaining specimen has tan-pink to pink-green smooth soft mucosa with normal intestinal folds, and a wall averaging 0.4 cm thick.  The nodular portion of attached mesentery is 2.7 x 2.4 x 1.5 cm and has a pale yellow-white firm ill-defined cut surface.  This firm ill-defined tissue is present at the mesenteric margin (margin inked black).  No lymphoid tissue is identified. Block summary: Block 1 = margin of resection nearest intramural mass Block 2 = margin of resection furthest from intramural mass Blocks 3-6 = intramural mass, entirely submitted Blocks 7, 8 = sections of nodular tissue involving mesentery, including inked mesenteric margin  SW 10/10/2022   Final Diagnosis performed by Holley Bouche, MD.   Electronically signed 10/13/2022 Technical and / or Professional components performed at [facility], [address] [place], [place] [address].  Immunohistochemistry Technical component (if applicable) was performed at [facility]. [address], [address], [place], [place] [address].   IMMUNOHISTOCHEMISTRY DISCLAIMER (if applicable): Some of these immunohistochemical stains may have been developed and the performance characteristics determine by [facility]. Some may not have been cleared or approved by the U.S. Food and Drug Administration.  The FDA has determined that such clearance or approval is not necessary. This test is used for clinical purposes. It should not be regarded as investigational or for research. This laboratory is certified under the Clinical Laboratory Improvement Amendments of 1988 (CLIA-88) as qualified to perform high complexity clinical laboratory testing.  The controls stained appropriately.    Cultures: No results found for this or any previous visit (from the past 720 hour(s)).  Labs: Results for orders placed or performed during the hospital encounter of 10/07/22 (from the past 48 hour(s))  Basic metabolic panel     Status: Abnormal   Collection Time: 10/12/22  8:18 AM  Result Value Ref Range   Sodium 138 135 - 145 mmol/L   Potassium 3.8 3.5 - 5.1 mmol/L   Chloride 103 98 - 111 mmol/L   CO2 25 22 - 32 mmol/L   Glucose, Bld 107 (H) 70 - 99 mg/dL    Comment: Glucose reference range applies only to samples taken after fasting for at least 8 hours.   BUN  19 8 - 23 mg/dL   Creatinine, Ser 4.09 0.44 - 1.00 mg/dL   Calcium 9.4 8.9 - 81.1 mg/dL   GFR, Estimated >91 >47 mL/min    Comment: (NOTE) Calculated using the CKD-EPI Creatinine Equation (2021)    Anion gap 10 5 - 15    Comment: Performed at [facility], [address] [place], [place] [address]  Magnesium     Status: None   Collection Time: 10/12/22  8:18 AM  Result Value Ref Range   Magnesium 1.8 1.7 - 2.4 mg/dL    Comment: Performed at [facility], [address], [place], [place] [address]  CBC     Status: None   Collection Time: 10/12/22  8:18 AM  Result Value Ref Range   WBC 5.1 4.0 - 10.5 K/uL   RBC 4.25 3.87 - 5.11 MIL/uL   Hemoglobin 12.6 12.0 - 15.0 g/dL   HCT 65.7 84.6 - 96.2 %   MCV 91.5 80.0 - 100.0 fL   MCH 29.6 26.0 - 34.0 pg   MCHC 32.4 30.0 - 36.0 g/dL   RDW 95.2 84.1 - 32.4 %   Platelets 295 150 - 400 K/uL   nRBC 0.0 0.0 - 0.2 %    Comment: Performed at [facility], [address], [place], [place] [address]     Imaging / Studies: No results found.  Medications / Allergies: per chart  Antibiotics: Anti-infectives (From admission, onward)    Start     Dose/Rate Route Frequency Ordered Stop   10/08/22 0100  cefoTEtan (CEFOTAN) 2 g in sodium chloride 0.9 % 100 mL IVPB        2 g 200 mL/hr over 30 Minutes Intravenous Every 12 hours 10/07/22 1716 10/08/22 0153   10/07/22 1115  cefoTEtan (CEFOTAN) 2 g in sodium chloride 0.9 % 100 mL IVPB        2 g 200 mL/hr over 30 Minutes Intravenous On call to O.R. 10/07/22 1109 10/07/22 1258         Note: Portions of this report may have been transcribed using voice recognition software. Every effort was made to ensure accuracy; however, inadvertent computerized transcription errors may be present.   Any transcriptional errors that result from this process are unintentional.    Christy Sportsman, MD, FACS, MASCRS Esophageal, Gastrointestinal & Colorectal Surgery Robotic and Minimally Invasive Surgery  [facility] [address] [place], [place] [address] [phone] Fax [phone] Main  CONTACT INFORMATION:  Weekday (9AM-5PM): Call [facility] main office at [phone]  Weeknight (5PM-9AM) or Weekend/Holiday: Check [email or web address] (password " [email or web address]") for General Surgery [facility] coverage  (Please, do not use SecureChat as it is not reliable communication to reach operating surgeons for immediate patient care given surgeries/outpatient duties/clinic/cross-coverage/off post-call which would lead to a delay in care.  Epic staff messaging available for outptient concerns, but may not be answered for 48 hours or more).     10/14/2022  6:02 AM

## 2022-10-14 NOTE — Plan of Care (Signed)
  Problem: Education: Goal: Understanding of discharge needs will improve Outcome: Progressing   Problem: Activity: Goal: Ability to tolerate increased activity will improve Outcome: Progressing   Problem: Bowel/Gastric: Goal: Gastrointestinal status for postoperative course will improve Outcome: Progressing   Problem: Clinical Measurements: Goal: Postoperative complications will be avoided or minimized Outcome: Progressing   Problem: Respiratory: Goal: Respiratory status will improve Outcome: Progressing   Problem: Skin Integrity: Goal: Will show signs of wound healing Outcome: Progressing   

## 2022-10-15 MED ORDER — ONDANSETRON HCL 4 MG PO TABS: 4.0000 mg | ORAL_TABLET | Freq: Three times a day (TID) | ORAL | 0 refills | Status: DC | PRN

## 2022-10-15 NOTE — Progress Notes (Signed)
Assessment unchanged. Pt verbalized understanding of dc instructions through teach back including medications, follow up care and when to call doctor. Discharged via wc to front entrance accompanied by friend and NT.

## 2022-10-15 NOTE — Progress Notes (Signed)
8 Days Post-Op   Subjective/Chief Complaint: A little nausea  No vomiting Ambulated Had Bms Tolerating diet   Objective: Vital signs in last 24 hours: Temp:  [97.6 F (36.4 C)-98.4 F (36.9 C)] 98.4 F (36.9 C) (05/11 0554) Pulse Rate:  [85-86] 85 (05/11 0554) Resp:  [18] 18 (05/11 0554) BP: (109-147)/(66-76) 109/66 (05/11 0554) SpO2:  [99 %-100 %] 99 % (05/11 0554) Weight:  [58.6 kg] 58.6 kg (05/11 0822) Last BM Date : 10/14/22  Intake/Output from previous day: 05/10 0701 - 05/11 0700 In: 840 [P.O.:840] Out: -  Intake/Output this shift: No intake/output data recorded.  Alert, nontoxic Symm chest rise Reg Soft, approp mild TTP, incisions ok  Lab Results:  No results for input(s): "WBC", "HGB", "HCT", "PLT" in the last 72 hours. BMET Recent Labs    10/14/22 0500  CREATININE 0.85   PT/INR No results for input(s): "LABPROT", "INR" in the last 72 hours. ABG No results for input(s): "PHART", "HCO3" in the last 72 hours.  Invalid input(s): "PCO2", "PO2"  Studies/Results: No results found.  Anti-infectives: Anti-infectives (From admission, onward)    Start     Dose/Rate Route Frequency Ordered Stop   10/08/22 0100  cefoTEtan (CEFOTAN) 2 g in sodium chloride 0.9 % 100 mL IVPB        2 g 200 mL/hr over 30 Minutes Intravenous Every 12 hours 10/07/22 1716 10/08/22 0153   10/07/22 1115  cefoTEtan (CEFOTAN) 2 g in sodium chloride 0.9 % 100 mL IVPB        2 g 200 mL/hr over 30 Minutes Intravenous On call to O.R. 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 Having BMs.  Ok for [employer] Discussed dc diet, bowel regimen.  F/u with dr gross  LOS: 8 days    Gaynelle Adu 10/15/2022

## 2022-10-15 NOTE — Plan of Care (Signed)

## 2022-10-17 ENCOUNTER — Telehealth: Payer: Self-pay

## 2022-10-17 NOTE — Discharge Summary (Signed)
Physician Discharge Summary    Patient ID: Christy Kirby MRN: [id number] DOB/AGE: September 29, 1950  72 y.o.  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  Not a classic stellate carcinoid but suspicious versus primary cancer.  No obvious metastatic disease on visceral parietal peritoneum or liver.  Long torturous appendix with adhesions -appendectomy done.   It is an isoperistaltic ileo-ileal anastomosis that rests in the pelvic region.   FINAL MICROSCOPIC DIAGNOSIS:  A. VERIFORM APPENDIX, APPENDECTOMY: - Benign appendix with no specific histopathologic changes  B. ILEUM MASS, RESECTION: - Well-differentiated neuroendocrine tumor (G1; low-grade), 1.5 cm, involving ileum - Tumor involves the serosal surface - Resection margins are negative for tumor - Metastatic carcinoma to two thank you of three lymph nodes (2/3) - Mesenteric tumor deposit, 2.7 cm - Pathologic Stage Classification (pTNM, AJCC 8th Edition): pT4, pN1  Consults: Case Management / Social Work, Administrator, arts,  and Anesthesia  Hospital Course:   The patient underwent the surgery above.  Postoperatively, the patient gradually mobilized and advanced to a solid diet.  Nausea, pain and other symptoms were treated aggressively.    By the time of discharge, the patient was walking well the hallways, eating food, having flatus.  Pain was well-controlled on an oral medications.  Based on meeting discharge criteria and continuing to recover, I felt it was safe for the patient to be discharged from the hospital to further recover with close followup. Postoperative recommendations were discussed in detail.  They are written as well.  As suspected, pathology consistent with neuroendocrine carcinoid tumor.  Appears well-differentiated.  Will have her follow-up with the [facility] and medical oncology for survival pathway.  I was able to curbside Dr. Truett Perna today.  He noted usually the are so slow-growing that they do not need aggressive treatment or intervention but just some monitoring.  He or his partner Dr. Mosetta Putt will help follow in come up with a survival pathway.   Discharged Condition: good  Discharge Exam: Blood pressure 109/66, pulse 85, temperature 98.4 F (36.9 C), temperature source Oral, resp. rate 18, height 5' 5.51" (1.664 m), weight 58.6 kg, SpO2 99 %.  General: Pt awake/alert/oriented x4 in No acute distress Eyes: PERRL, normal EOM.  Sclera clear.  No icterus Neuro: CN II-XII intact w/o focal sensory/motor deficits. Lymph: No head/neck/groin lymphadenopathy Psych:  No delerium/psychosis/paranoia HENT: Normocephalic, Mucus membranes moist.  No thrush Neck: Supple, No tracheal deviation Chest:  No chest wall pain w good excursion CV:  Pulses intact.  Regular rhythm MS: Normal AROM mjr joints.  No obvious deformity Abdomen: Soft.  Nondistended.  Nontender.  No evidence of  peritonitis.  No incarcerated hernias. Ext:  SCDs BLE.  No mjr edema.  No cyanosis Skin: No petechiae /  purpura   Disposition:    Follow-up Information     Karie Soda, MD Follow up on 10/31/2022.   Specialties: General Surgery, Colon and Rectal Surgery Contact information: [address] [place] [address] [phone]                 Discharge disposition: 01-Home or Self Care       Discharge Instructions     Call MD for:   Complete by: As directed    FEVER > 101.5 F  (temperatures < 101.5 F are not significant)   Call MD for:  extreme fatigue   Complete by: As directed    Call MD for:  persistant dizziness or light-headedness   Complete by: As directed    Call MD for:  persistant nausea and vomiting   Complete by: As directed    Call MD for:  redness, tenderness, or signs of infection (pain, swelling, redness, odor or green/yellow discharge around incision site)   Complete by: As directed    Call MD for:  severe uncontrolled pain   Complete by: As directed    Diet - low sodium heart healthy   Complete by: As directed    Start with a bland diet such as soups, liquids, starchy foods, low fat foods, etc. the first few days at home. Gradually advance to a solid, low-fat, high fiber diet by the end of the first week at home.   Add a fiber supplement to your diet (Metamucil, etc) If you feel full, bloated, or constipated, stay on a full liquid or pureed/blenderized diet for a few days until you feel better and are no longer constipated.   Discharge instructions   Complete by: As directed    See Discharge Instructions If you are not getting better after two weeks or are noticing you are getting worse, contact our office [phone] for further advice.  We may need to adjust your medications, re-evaluate you in the office, send you to the emergency room, or see what other things we can do to help. The clinic staff is available to answer your questions during regular business hours (8:30am-5pm).  Please don't hesitate to call and ask to speak to one  of our nurses for clinical concerns.    A surgeon from [facility] is always on call at the hospitals 24 hours/day If you have a medical emergency, go to the nearest emergency room or call 911.   Discharge patient   Complete by: As directed    Discharge disposition: 01-Home or Self Care   Discharge patient date: 10/15/2022   Discharge wound care:   Complete by: As directed    It is good for closed incisions and even open wounds to be washed every day.  Shower every day.  Short baths are fine.  Wash the incisions and wounds clean with soap & water.    You may leave closed incisions open to air if it is dry.   You may cover the incision with clean gauze & replace it after your daily shower for comfort.  TEGADERM:  You have clear gauze band-aid dressings over your closed incision(s).  Remove the dressings 3 days after surgery.   Driving Restrictions   Complete by: As directed    You may drive when: - you are no longer taking narcotic prescription pain medication -  you can comfortably wear a seatbelt - you can safely make sudden turns/stops without pain.   Increase activity slowly   Complete by: As directed    Start light daily activities --- self-care, walking, climbing stairs- beginning the day after surgery.  Gradually increase activities as tolerated.  Control your pain to be active.  Stop when you are tired.  Ideally, walk several times a day, eventually an hour a day.   Most people are back to most day-to-day activities in a few weeks.  It takes 4-6 weeks to get back to unrestricted, intense activity. If you can walk 30 minutes without difficulty, it is safe to try more intense activity such as jogging, treadmill, bicycling, low-impact aerobics, swimming, etc. Save the most intensive and strenuous activity for last (Usually 4-8 weeks after surgery) such as sit-ups, heavy lifting, contact sports, etc.  Refrain from any intense heavy lifting or straining until you are off  narcotics for pain control.  You will have off days, but things should improve week-by-week. DO NOT PUSH THROUGH PAIN.  Let pain be your guide: If it hurts to do something, don't do it.   Lifting restrictions   Complete by: As directed    If you can walk 30 minutes without difficulty, it is safe to try more intense activity such as jogging, treadmill, bicycling, low-impact aerobics, swimming, etc. Save the most intensive and strenuous activity for last (Usually 4-8 weeks after surgery) such as sit-ups, heavy lifting, contact sports, etc.   Refrain from any intense heavy lifting or straining until you are off narcotics for pain control.  You will have off days, but things should improve week-by-week. DO NOT PUSH THROUGH PAIN.  Let pain be your guide: If it hurts to do something, don't do it.  Pain is your body warning you to avoid that activity for another week until the pain goes down.   May shower / Bathe   Complete by: As directed    May walk up steps   Complete by: As directed    Remove dressing in 72 hours   Complete by: As directed    Make sure all dressings are removed by the third day after surgery.  Leave incisions open to air.  OK to cover incisions with gauze or bandages as desired   Sexual Activity Restrictions   Complete by: As directed    You may have sexual intercourse when it is comfortable. If it hurts to do something, stop.       Allergies as of 10/15/2022       Reactions   Codeine Nausea Only   Nsaids Other (See Comments)   History of gastritis = minimize NSAIDs        Medication List     STOP taking these medications    sucralfate 1 g tablet Commonly known as: CARAFATE       TAKE these medications    acetaminophen 325 MG tablet Commonly known as: TYLENOL Take 2 tablets (650 mg total) by mouth every 6 (six) hours as needed for mild pain or headache.   Bismuth/Metronidaz/Tetracyclin 140-125-125 MG Caps Commonly known as: Pylera Take 3 Capfuls by  mouth 4 (four) times daily.   metoprolol tartrate 25 MG tablet Commonly known as: LOPRESSOR TAKE 0.5 TABLETS BY MOUTH 2 TIMES DAILY.   ondansetron 4 MG tablet Commonly known as: Zofran Take 1 tablet (4 mg total) by mouth every 8 (eight) hours as needed for nausea or vomiting.   pantoprazole 40 MG  tablet Commonly known as: PROTONIX Take 1 tablet (40 mg total) by mouth 2 (two) times daily for 30 days, THEN 1 tablet (40 mg total) daily. Start taking on: June 10, 2022   polycarbophil 625 MG tablet Commonly known as: FIBERCON Take 1 tablet (625 mg total) by mouth 2 (two) times daily.   traMADol 50 MG tablet Commonly known as: ULTRAM Take 1-2 tablets (50-100 mg total) by mouth every 6 (six) hours as needed for moderate pain or severe pain. What changed:  how much to take reasons to take this               Discharge Care Instructions  (From admission, onward)           Start     Ordered   10/07/22 0000  Discharge wound care:       Comments: It is good for closed incisions and even open wounds to be washed every day.  Shower every day.  Short baths are fine.  Wash the incisions and wounds clean with soap & water.    You may leave closed incisions open to air if it is dry.   You may cover the incision with clean gauze & replace it after your daily shower for comfort.  TEGADERM:  You have clear gauze band-aid dressings over your closed incision(s).  Remove the dressings 3 days after surgery.   10/07/22 1114            Significant Diagnostic Studies:  No results found for this or any previous visit (from the past 72 hour(s)).  No results found.  Past Medical History:  Diagnosis Date   Arthritis    hip   GERD (gastroesophageal reflux disease)    Hyperlipidemia    Hypertension    Intractable nausea and vomiting 06/07/2022    Past Surgical History:  Procedure Laterality Date   BIOPSY  06/08/2022   Procedure: BIOPSY;  Surgeon: Meridee Score Netty Starring., MD;   Location: [facility];  Service: Gastroenterology;;   COLONOSCOPY  06/26/2017   per Dr. Marina Goodell, benign polyps, repeat in 10 yrs    ESOPHAGOGASTRODUODENOSCOPY (EGD) WITH PROPOFOL N/A 06/08/2022   Procedure: ESOPHAGOGASTRODUODENOSCOPY (EGD) WITH PROPOFOL;  Surgeon: Lemar Lofty., MD;  Location: [facility];  Service: Gastroenterology;  Laterality: N/A;   NEUROPLASTY / TRANSPOSITION MEDIAN NERVE AT CARPAL TUNNEL BILATERAL     POLYPECTOMY     TOTAL HIP ARTHROPLASTY Right 01/11/2018   Procedure: RIGHT TOTAL HIP ARTHROPLASTY ANTERIOR APPROACH;  Surgeon: Samson Frederic, MD;  Location: [facility];  Service: Orthopedics;  Laterality: Right;   XI ROBOTIC ASSISTED SMALL BOWEL RESECTION N/A 10/07/2022   Procedure: XI ROBOTIC ASSISTED SMALL BOWEL RESECTION, BILATERAL TAP BLOCK, ASSESSMENT OF TISSUE PERFUSSION VIA FIREFLY INJECTION;  Surgeon: Karie Soda, MD;  Location: [facility];  Service: General;  Laterality: N/A;    Social History   Socioeconomic History   Marital status: Legally Separated    Spouse name: Not on file   Number of children: Not on file   Years of education: Not on file   Highest education level: Not on file  Occupational History   Not on file  Tobacco Use   Smoking status: Former    Years: 1    Types: Cigarettes   Smokeless tobacco: Never   Tobacco comments:    only when [age] years old briefly   Vaping Use   Vaping Use: Never used  Substance and Sexual Activity   Alcohol use: Not Currently  Comment: occ wine    Drug use: No   Sexual activity: Not Currently  Other Topics Concern   Not on file  Social History Narrative   Not on file   Social Determinants of Health   Financial Resource Strain: Low Risk  (04/05/2022)   Overall Financial Resource Strain (CARDIA)    Difficulty of Paying Living Expenses: Not hard at all  Food Insecurity: No Food Insecurity (10/07/2022)   Hunger Vital Sign    Worried About Running Out of Food in the Last Year: Never true    Ran Out of  Food in the Last Year: Never true  Transportation Needs: No Transportation Needs (10/07/2022)   PRAPARE - Administrator, Civil Service (Medical): No    Lack of Transportation (Non-Medical): No  Physical Activity: Inactive (04/05/2022)   Exercise Vital Sign    Days of Exercise per Week: 0 days    Minutes of Exercise per Session: 0 min  Stress: No Stress Concern Present (04/05/2022)   [employer] of Occupational Health - Occupational Stress Questionnaire    Feeling of Stress : Not at all  Social Connections: Moderately Integrated (04/05/2022)   Social Connection and Isolation Panel [NHANES]    Frequency of Communication with Friends and Family: More than three times a week    Frequency of Social Gatherings with Friends and Family: More than three times a week    Attends Religious Services: More than 4 times per year    Active Member of [employer] or Organizations: Yes    Attends Banker Meetings: More than 4 times per year    Marital Status: Separated  Intimate Partner Violence: Not At Risk (10/07/2022)   Humiliation, Afraid, Rape, and Kick questionnaire    Fear of Current or Ex-Partner: No    Emotionally Abused: No    Physically Abused: No    Sexually Abused: No    Family History  Problem Relation Age of Onset   Diabetes Sister    Colon cancer Neg Hx    Colon polyps Neg Hx    Esophageal cancer Neg Hx    Rectal cancer Neg Hx    Stomach cancer Neg Hx     No current facility-administered medications for this encounter.   Current Outpatient Medications  Medication Sig Dispense Refill   metoprolol tartrate (LOPRESSOR) 25 MG tablet TAKE 0.5 TABLETS BY MOUTH 2 TIMES DAILY. 90 tablet 0   traMADol (ULTRAM) 50 MG tablet Take 1-2 tablets (50-100 mg total) by mouth every 6 (six) hours as needed for moderate pain or severe pain. 20 tablet 0   acetaminophen (TYLENOL) 325 MG tablet Take 2 tablets (650 mg total) by mouth every 6 (six) hours as needed for mild pain or  headache. (Patient not taking: Reported on 08/04/2022)     Bismuth/Metronidaz/Tetracyclin (PYLERA) 140-125-125 MG CAPS Take 3 Capfuls by mouth 4 (four) times daily. (Patient not taking: Reported on 08/04/2022) 120 capsule 0   ondansetron (ZOFRAN) 4 MG tablet Take 1 tablet (4 mg total) by mouth every 8 (eight) hours as needed for nausea or vomiting. 20 tablet 0   pantoprazole (PROTONIX) 40 MG tablet Take 1 tablet (40 mg total) by mouth 2 (two) times daily for 30 days, THEN 1 tablet (40 mg total) daily. (Patient not taking: Reported on 08/04/2022) 120 tablet 0   polycarbophil (FIBERCON) 625 MG tablet Take 1 tablet (625 mg total) by mouth 2 (two) times daily. (Patient not taking: Reported on 08/04/2022)  Allergies  Allergen Reactions   Codeine Nausea Only   Nsaids Other (See Comments)    History of gastritis = minimize NSAIDs    Signed:   Ardeth Sportsman, MD, FACS, MASCRS Esophageal, Gastrointestinal & Colorectal Surgery Robotic and Minimally Invasive Surgery  [facility] [address], [address] [place], [place] [address] [phone] Fax [phone] Main  CONTACT INFORMATION:  Weekday (9AM-5PM): Call [facility] main office at [phone]  Weeknight (5PM-9AM) or Weekend/Holiday: Check [email or web address] (password " [email or web address]") for General Surgery [facility] coverage  (Please, do not use SecureChat as it is not reliable communication to reach operating surgeons for immediate patient care given surgeries/outpatient duties/clinic/cross-coverage/off post-call which would lead to a delay in care.  Epic staff messaging available for outptient concerns, but may not be answered for 48 hours or more).     10/17/2022, 7:32 AM

## 2022-10-17 NOTE — Transitions of Care (Post Inpatient/ED Visit) (Signed)
   10/17/2022  Name: Christy Kirby MRN: [id number] DOB: 1950-07-22  Today's TOC FU Call Status: Today's TOC FU Call Status:: Unsuccessul Call (1st Attempt) Unsuccessful Call (1st Attempt) Date: 10/17/22  Attempted to reach the patient regarding the most recent Inpatient/ED visit.  Follow Up Plan: Additional outreach attempts will be made to reach the patient to complete the Transitions of Care (Post Inpatient/ED visit) call.     Antionette Fairy, RN,BSN,CCM [facility] Care Management Care Management Community Coordinator Direct Phone: [phone] Toll Free: [phone] Fax: [phone]

## 2022-10-18 ENCOUNTER — Telehealth: Payer: Self-pay

## 2022-10-18 NOTE — Transitions of Care (Post Inpatient/ED Visit) (Signed)
   10/18/2022  Name: Christy Kirby MRN: [id number] DOB: [birth year]  Today's TOC FU Call Status: Today's TOC FU Call Status:: Unsuccessful Call (2nd Attempt) Unsuccessful Call (2nd Attempt) Date: 10/18/22  Attempted to reach the patient regarding the most recent Inpatient/ED visit.  Follow Up Plan: Additional outreach attempts will be made to reach the patient to complete the Transitions of Care (Post Inpatient/ED visit) call.     Antionette Fairy, RN,BSN,CCM [facility] Care Management Care Management Community Coordinator Direct Phone: [phone] Toll Free: [phone] Fax: [phone]

## 2022-10-18 NOTE — Transitions of Care (Post Inpatient/ED Visit) (Signed)
10/18/2022  Name: Christy Kirby MRN: [id number] DOB: Sep 08, 1950  Today's TOC FU Call Status: Today's TOC FU Call Status:: Successful TOC FU Call Competed TOC FU Call Complete Date: 10/18/22  Transition Care Management Follow-up Telephone Call Date of Discharge: 10/15/22 Discharge Facility: [facility]) Type of Discharge: Inpatient Admission Primary Inpatient Discharge Diagnosis:: "ileal mass s/p robotic SB resection" How have you been since you were released from the hospital?: Same (Pt states she is still "weak and bloated at times." She does not have much of an appetite. She has had some occasional nausea but no vomiting-has not taken Zofran encouraged to try med. LBM was this am-states it "was a little loose.") Any questions or concerns?: No  Items Reviewed: Did you receive and understand the discharge instructions provided?: Yes Medications obtained,verified, and reconciled?: Yes (Medications Reviewed) Any new allergies since your discharge?: No Dietary orders reviewed?: Yes Type of Diet Ordered:: reviewed post op diet recommendations with pt Do you have support at home?: Yes Name of Support/Comfort Primary Source: daughter helps out  Medications Reviewed Today: Medications Reviewed Today     Reviewed by Charlyn Minerva, RN (Registered Nurse) on 10/18/22 at 1609  Med List Status: <None>   Medication Order Taking? Sig Documenting Provider Last Dose Status Informant  acetaminophen (TYLENOL) 325 MG tablet [id number] Yes Take 2 tablets (650 mg total) by mouth every 6 (six) hours as needed for mild pain or headache.  Patient taking differently: Take 650 mg by mouth every 6 (six) hours as needed for mild pain or headache. Pt states taking 500mg  tabs -2tabs about every6hrs prn   Rodolph Bong, MD Taking Active Self  Bismuth/Metronidaz/Tetracyclin [facility]) [id number] MG CAPS [id number] No Take 3 Capfuls by mouth 4 (four) times daily.  Patient not taking: Reported  on 08/04/2022   Mansouraty, Netty Starring., MD Not Taking Active Self  metoprolol tartrate (LOPRESSOR) 25 MG tablet [id number] Yes TAKE 0.5 TABLETS BY MOUTH 2 TIMES DAILY. Nelwyn Salisbury, MD Taking Active Self  ondansetron [facility]) 4 MG tablet [id number] Yes Take 1 tablet (4 mg total) by mouth every 8 (eight) hours as needed for nausea or vomiting. Gaynelle Adu, MD Taking Active   pantoprazole (PROTONIX) 40 MG tablet [id number]  Take 1 tablet (40 mg total) by mouth 2 (two) times daily for 30 days, THEN 1 tablet (40 mg total) daily.  Patient not taking: Reported on 08/04/2022   Rodolph Bong, MD  Expired 09/08/22 2359   polycarbophil (FIBERCON) 625 MG tablet [id number] No Take 1 tablet (625 mg total) by mouth 2 (two) times daily.  Patient not taking: Reported on 08/04/2022   Rodolph Bong, MD Not Taking Active Self  traMADol Jane[facility] Sark) 50 MG tablet [id number] Yes Take 1-2 tablets (50-100 mg total) by mouth every 6 (six) hours as needed for moderate pain or severe pain. Karie Soda, MD Taking Active             Home Care and Equipment/Supplies: Were Home Health Services Ordered?: NA Any new equipment or medical supplies ordered?: NA  Functional Questionnaire: Do you need assistance with bathing/showering or dressing?: No Do you need assistance with meal preparation?: Yes Do you need assistance with eating?: No Do you have difficulty maintaining continence: No Do you need assistance with getting out of bed/getting out of a chair/moving?: No Do you have difficulty managing or taking your medications?: No  Follow up appointments reviewed: PCP Follow-up appointment confirmed?: Yes Date of PCP follow-up appointment?: 10/27/22 Follow-up  Provider: Dr. Clent Ridges Specialist [facility] Follow-up appointment confirmed?: Yes Date of Specialist follow-up appointment?: 10/24/22 Follow-Up Specialty Provider:: Dr. Michaell Cowing Do you need transportation to your follow-up appointment?: No (pt states daughter will  take her to appts) Do you understand care options if your condition(s) worsen?: Yes-patient verbalized understanding   TOC Interventions Today    Flowsheet Row Most Recent Value  TOC Interventions   TOC Interventions Discussed/Reviewed TOC Interventions Discussed, Post discharge activity limitations per provider, S/S of infection, Post op wound/incision care, Arranged PCP follow up less than 12 days/Care Guide scheduled  [pt states surgical incision open to area and looks fine]      Interventions Today    Flowsheet Row Most Recent Value  General Interventions   General Interventions Discussed/Reviewed General Interventions Discussed, Doctor Visits  Doctor Visits Discussed/Reviewed Doctor Visits Discussed, PCP, Specialist  PCP/Specialist Visits Compliance with follow-up visit  Education Interventions   Education Provided Provided Education  Provided Verbal Education On Nutrition, When to see the doctor, Medication, Other  [pain mgmt, sx mgmt]  Nutrition Interventions   Nutrition Discussed/Reviewed Nutrition Discussed  [reviewed with pt post-op diet recommendations, encouraged increased hydration]  Pharmacy Interventions   Pharmacy Dicussed/Reviewed Pharmacy Topics Discussed, Medications and their functions  Safety Interventions   Safety Discussed/Reviewed Safety Discussed       Alessandra Grout [facility] Care Management Care Management Community Coordinator Direct Phone: [phone] Toll Free: [phone] Fax: [phone]

## 2022-10-19 ENCOUNTER — Other Ambulatory Visit: Payer: Self-pay

## 2022-10-19 LAB — SURGICAL PATHOLOGY

## 2022-10-19 NOTE — Progress Notes (Signed)
The proposed treatment discussed in conference is for discussion purpose only and is not a binding recommendation.  The patients have not been physically examined, or presented with their treatment options.  Therefore, final treatment plans cannot be decided.  

## 2022-10-24 DIAGNOSIS — C7A012 Malignant carcinoid tumor of the ileum: Secondary | ICD-10-CM | POA: Insufficient documentation

## 2022-10-26 ENCOUNTER — Telehealth: Payer: Self-pay | Admitting: Physician Assistant

## 2022-10-26 NOTE — Telephone Encounter (Signed)
scheduled per referral, pt has been called and confirmed date and time. Pt is aware of location and to arrive early for check in , also sent letter per pt request

## 2022-10-27 ENCOUNTER — Ambulatory Visit (INDEPENDENT_AMBULATORY_CARE_PROVIDER_SITE_OTHER): Payer: Medicare HMO | Admitting: Family Medicine

## 2022-10-27 ENCOUNTER — Encounter: Payer: Self-pay | Admitting: Family Medicine

## 2022-10-27 VITALS — BP 110/70 | HR 80 | Temp 97.7°F | Wt 137.2 lb

## 2022-10-27 DIAGNOSIS — I10 Essential (primary) hypertension: Secondary | ICD-10-CM

## 2022-10-27 DIAGNOSIS — K6389 Other specified diseases of intestine: Secondary | ICD-10-CM | POA: Diagnosis not present

## 2022-10-27 NOTE — Progress Notes (Signed)
   Subjective:    Patient ID: Christy Kirby, female    DOB: 10/27/1950, 72 y.o.   MRN: [id number]  HPI Here for a transitional care visit to follow up on a hospital stay from 10-07-22 to 10-15-22 for a SBO. She presented with nausea and vomiting, and scan s revealed a small bowel mass. She had robotic assisted laparoscopic surgery to remove her ileum, cecum, and appendix. The mass was then shown to be a well differentiated carcinoid tumor. Oncology was consulted and they said this is a very slow growing tumor and that no aggressive treatment wold be needed. Since going home her pain has almost disappeared. The nausea is gone. She is advancing her diet and now she can eat almost anything. Her bowels are moving once a day. She has been taking half of a Metoprolol tartrate pill BID, and her BP has been a little low at home    Review of Systems  Constitutional: Negative.   Respiratory: Negative.    Cardiovascular: Negative.   Gastrointestinal: Negative.   Genitourinary: Negative.        Objective:   Physical Exam Constitutional:      Appearance: Normal appearance.  Cardiovascular:     Rate and Rhythm: Normal rate and regular rhythm.     Pulses: Normal pulses.     Heart sounds: Normal heart sounds.  Pulmonary:     Effort: Pulmonary effort is normal.     Breath sounds: Normal breath sounds.  Abdominal:     General: Abdomen is flat. Bowel sounds are normal. There is no distension.     Palpations: Abdomen is soft. There is no mass.     Tenderness: There is no guarding or rebound.     Hernia: No hernia is present.     Comments: Very slight tenderness only  Neurological:     Mental Status: She is alert.           Assessment & Plan:  She is doing well after surgical removal of a carcinoid tumor from her small bowel. She had a follow up visit with Surgery 2 days ago and they have signed off on her. She is scheduled to see Oncology on 11-04-22. Her BP has normalized with her weight loss,  so we will stop the Metoprolol. She will monitor the BP at home. We spent a total of ( 33  ) minutes reviewing records and discussing these issues.  Gershon Crane, MD

## 2022-11-02 NOTE — Progress Notes (Addendum)
[facility] [phone]   [phone]  CONSULT NOTE  REFERRING PHYSICIAN: Dr. Michaell Cowing  REASON FOR CONSULTATION:  Carcinoid tumor   HPI Christy Kirby is a 72 y.o. female with a past medical history significant for hypertension (currently not anti-hypertensives and BP controlled at this time), hypertensive urgency, gastritis, GERD, H. pylori, osteoarthritis, and hyperlipidemia is referred to the clinic for carcinoid tumor.   In summary, the patient was admitted to the hospital in January for the chief complaint of nausea, vomiting, abdominal pain.  She also had diarrhea and there was concern for possible gastritis.  She had an endoscopy done and placed on PPIs for gastritis. She had several CT scans performed during admission; however, her CT from 06/07/22 scan raise concern for small 19 x 17 mm hypoechoic lesion within the small bowel that was, in hindsight, seen on prior study.    She then subsequently had an outpatient CT enterography on July 14, 2022 which showed concern for persistent 11 mm distal small bowel lesion such as a carcinoid.  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 She was seen by Dr. Marina Goodell who recommended surgical evaluation.   She then underwent robotic ileal resection on 10/07/2022 by Dr. Michaell Cowing  The final pathology [id number]) of the ileal mass showed well-differentiated neuroendocrine tumor, low-grade measuring 1.5 cm. Ki-67 of 1%. There was metastatic tumor to 2 of 3 lymph nodes and mesenteric tumor deposit measuring 2.7 cm. This was staged as a pT4, PN 1  Of note the patient did have a CT angio chest, abdomen, and pelvis on 06/05/2022 which did not show any metastatic disease to the chest or abdomen.  Overall, she is feeling well today.  She denies any fever, chills, night sweats, or unexplained weight loss.  Denies any appetite changes.  Denies any chest pain, shortness of  breath, cough, or hemoptysis.  Denies any odynophagia or dysphagia.  Denies any significant abdominal pain. Denies any nausea, vomiting, diarrhea, or constipation.  Denies any flushing.  Denies any wheezing.  Denies any unusual back pain (she may have stable mild back pain if standing too long) or bone pain.  Her family history is negative for malignancy except for her mother who had ovarian cancer in her 56's. Her father was killed. Her sister passed away due to CHF.   She is retired and used to work as an Psychologist, educational.  She is accompanied by her husband, Christy Kirby today.  She denies any smoking, alcohol, or drug use.  She has 2 daughters.  One of her daughters is a resident of a nursing home secondary to a CVA.   HPI  Past Medical History:  Diagnosis Date   Arthritis    hip   GERD (gastroesophageal reflux disease)    Hyperlipidemia    Hypertension    Intractable nausea and vomiting 06/07/2022    Past Surgical History:  Procedure Laterality Date   BIOPSY  06/08/2022   Procedure: BIOPSY;  Surgeon: Meridee Score Netty Starring., MD;  Location: [facility];  Service: Gastroenterology;;   COLONOSCOPY  06/26/2017   per Dr. Marina Goodell, benign polyps, repeat in 10 yrs    ESOPHAGOGASTRODUODENOSCOPY (EGD) WITH PROPOFOL N/A 06/08/2022   Procedure: ESOPHAGOGASTRODUODENOSCOPY (EGD) WITH PROPOFOL;  Surgeon: Lemar Lofty., MD;  Location: [facility];  Service: Gastroenterology;  Laterality: N/A;   NEUROPLASTY / TRANSPOSITION MEDIAN NERVE AT CARPAL TUNNEL BILATERAL     POLYPECTOMY     TOTAL HIP ARTHROPLASTY  Right 01/11/2018   Procedure: RIGHT TOTAL HIP ARTHROPLASTY ANTERIOR APPROACH;  Surgeon: Samson Frederic, MD;  Location: [facility];  Service: Orthopedics;  Laterality: Right;   XI ROBOTIC ASSISTED SMALL BOWEL RESECTION N/A 10/07/2022   Procedure: XI ROBOTIC ASSISTED SMALL BOWEL RESECTION, BILATERAL TAP BLOCK, ASSESSMENT OF TISSUE PERFUSSION VIA FIREFLY INJECTION;  Surgeon: Karie Soda, MD;  Location:  [facility];  Service: General;  Laterality: N/A;    Family History  Problem Relation Age of Onset   Diabetes Sister    Colon cancer Neg Hx    Colon polyps Neg Hx    Esophageal cancer Neg Hx    Rectal cancer Neg Hx    Stomach cancer Neg Hx     Social History Social History   Tobacco Use   Smoking status: Former    Years: 1    Types: Cigarettes   Smokeless tobacco: Never   Tobacco comments:    only when [age] years old briefly   Vaping Use   Vaping Use: Never used  Substance Use Topics   Alcohol use: Not Currently    Comment: occ wine    Drug use: No    Allergies  Allergen Reactions   Codeine Nausea Only   Nsaids Other (See Comments)    History of gastritis = minimize NSAIDs    Current Outpatient Medications  Medication Sig Dispense Refill   acetaminophen (TYLENOL) 325 MG tablet Take 2 tablets (650 mg total) by mouth every 6 (six) hours as needed for mild pain or headache. (Patient taking differently: Take 650 mg by mouth every 6 (six) hours as needed for mild pain or headache. Pt states taking 500mg  tabs -2tabs about every6hrs prn)     ondansetron (ZOFRAN) 4 MG tablet Take 1 tablet (4 mg total) by mouth every 8 (eight) hours as needed for nausea or vomiting. 20 tablet 0   traMADol (ULTRAM) 50 MG tablet Take 1-2 tablets (50-100 mg total) by mouth every 6 (six) hours as needed for moderate pain or severe pain. 20 tablet 0   No current facility-administered medications for this visit.    REVIEW OF SYSTEMS:   Review of Systems  Constitutional: Negative for appetite change, chills, fatigue, fever and unexpected weight change.  HENT: Negative for mouth sores, nosebleeds, sore throat and trouble swallowing.   Eyes: Negative for eye problems and icterus.  Respiratory: Negative for cough, hemoptysis, shortness of breath and wheezing.   Cardiovascular: Negative for chest pain and leg swelling.  Gastrointestinal: Negative for abdominal pain, constipation, diarrhea, nausea and  vomiting.  Genitourinary: Negative for bladder incontinence, difficulty urinating, dysuria, frequency and hematuria.   Musculoskeletal: Negative for back pain (none at this time), gait problem, neck pain and neck stiffness.  Skin: Negative for itching and rash.  Neurological: Negative for dizziness, extremity weakness, gait problem, headaches, light-headedness and seizures.  Hematological: Negative for adenopathy. Does not bruise/bleed easily.  Psychiatric/Behavioral: Negative for confusion, depression and sleep disturbance. The patient is not nervous/anxious.     PHYSICAL EXAMINATION:  Blood pressure 121/76, pulse 88, temperature 97.6 F (36.4 C), temperature source Tympanic, weight 135 lb 14.4 oz (61.6 kg), SpO2 100 %.  ECOG PERFORMANCE STATUS: 0  Physical Exam  Constitutional: Oriented to person, place, and time and well-developed, well-nourished, and in no distress.  HENT:  Head: Normocephalic and atraumatic.  Mouth/Throat: Oropharynx is clear and moist. No oropharyngeal exudate.  Eyes: Conjunctivae are normal. Right eye exhibits no discharge. Left eye exhibits no discharge. No scleral icterus.  Neck:  Normal range of motion. Neck supple.  Cardiovascular: Normal rate, regular rhythm, normal heart sounds and intact distal pulses.   Pulmonary/Chest: Effort normal and breath sounds normal. No respiratory distress. No wheezes. No rales.  Abdominal: Soft. Bowel sounds are normal. Exhibits no distension and no mass. There is no tenderness.  Musculoskeletal: Normal range of motion. Exhibits no edema.  Lymphadenopathy:    No cervical adenopathy.  Neurological: Alert and oriented to person, place, and time. Exhibits normal muscle tone. Gait normal. Coordination normal.  Skin: Skin is warm and dry. No rash noted. Not diaphoretic. No erythema. No pallor.  Psychiatric: Mood, memory and judgment normal.  Vitals reviewed.  LABORATORY DATA: Lab Results  Component Value Date   WBC 5.1  10/12/2022   HGB 12.6 10/12/2022   HCT 38.9 10/12/2022   MCV 91.5 10/12/2022   PLT 295 10/12/2022      Chemistry      Component Value Date/Time   NA 138 10/12/2022 0818   K 3.8 10/12/2022 0818   CL 103 10/12/2022 0818   CO2 25 10/12/2022 0818   BUN 19 10/12/2022 0818   CREATININE 0.85 10/14/2022 0500      Component Value Date/Time   CALCIUM 9.4 10/12/2022 0818   ALKPHOS 40 06/08/2022 0801   AST 20 06/08/2022 0801   ALT 14 06/08/2022 0801   BILITOT 0.8 06/08/2022 0801       RADIOGRAPHIC STUDIES: No results found.  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 Treated for gastric -CT imaging on 06/07/2022 showed low-density area in the small bowel 19 x 17 mm.  -She followed up outpatient with Dr. Marina Goodell from GI.  -CT enterography on 07/14/2022 showed 11 mm distal small bowel lesion suspicious for small bowel carcinoid and associated ileal mesenteric nodes.  -Underwent ileal resection by Dr. Michaell Cowing on 10/07/2022.  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 Ki-67 1%. Also mesenteric tumor deposit measuring 2.7 cm was resected.  Staged as a T4, N1.  -The patient was seen with Dr. Mosetta Putt today.  Dr. Mosetta Putt had a lengthy discussion with the patient about her current condition and next steps.  -Dr. Mosetta Putt discussed that neuroendocrine tumors are slow-growing in nature. The patient likely had this for a few years -Dr. Mosetta Putt recommends dotatate scan to ensure no other sites of disease.  -We will call her with the results.  -If the scan is negative, then no aggressive measures are needed at this time. There is a 30-40% chance the tumor can come back in the future. Surveillance  includes labs work every 6 months and CT surveillance imaging 1x per year or every other year  -We will obtain baseline CBC, CMP, Chromogranin A, and 24 hour 5-HIAA urine testing.  -Will call with scan results. Will likely see in 6 months if negative. Of course, if she develops new symptoms such as abdominal pain, bloating, etc, we would recommend she call and be evaluated sooner -If dotatate scan positive, we will call her to be evaluated sooner.    PLAN: -Arrange for 24-hour 5 HIAA 24-hour urine and chromogranin A testing -Will call patient with results of scan -Ordered Dotatate PET scan  -Will follow up sooner if scan shows concerning findings. Otherwise, surveillance with labs every 6  months  The patient voices understanding of current disease status and treatment options and is in agreement with the current care plan.  All questions were answered. The patient knows to call the clinic with any problems, questions or concerns. We can certainly see the patient much sooner if necessary.  Thank you so much for allowing me to participate in the care of Christy Kirby. I will continue to follow up the patient with you and assist in her care.  Disclaimer: This note was dictated with voice recognition software. Similar sounding words can inadvertently be transcribed and may not be corrected upon review.   Christy Kirby Nov 04, 2022, 12:19 PM   Addendum I have seen the patient, examined her. I agree with the assessment and and plan and have edited the notes.   Patient is a 72 year old female, with past medical history of hypertension, GERD, arthritis, who was referred by her surgeon Dr. Michaell Cowing due to her recently diagnosed neuroendocrine tumor of small intestine.  I reviewed her surgical pathology reports, and personally reviewed her CT scan images.  I recommend dotatate PET scan to rule out any residual disease.  If PET scan negative, I recommend surveillance.  We discussed high  risk of recurrence due to her multiple positive lymph nodes and tumor deposits in the mesentery.  We discussed what to watch, and surveillance plan was lab, physical exam, and routine CT scan every 1 to 2 years.  All questions were answered, she voiced good understanding and agrees with the plan.  Malachy Mood MD 11/04/2022

## 2022-11-04 ENCOUNTER — Other Ambulatory Visit: Payer: Medicare HMO

## 2022-11-04 ENCOUNTER — Inpatient Hospital Stay: Payer: Medicare HMO

## 2022-11-04 ENCOUNTER — Inpatient Hospital Stay: Payer: Medicare HMO | Admitting: Physician Assistant

## 2022-11-04 ENCOUNTER — Inpatient Hospital Stay: Payer: Medicare HMO | Attending: Physician Assistant

## 2022-11-04 VITALS — BP 121/76 | HR 88 | Temp 97.6°F | Wt 135.9 lb

## 2022-11-04 DIAGNOSIS — C7A098 Malignant carcinoid tumors of other sites: Secondary | ICD-10-CM

## 2022-11-04 DIAGNOSIS — Z87891 Personal history of nicotine dependence: Secondary | ICD-10-CM | POA: Insufficient documentation

## 2022-11-04 DIAGNOSIS — C7B09 Secondary carcinoid tumors of other sites: Secondary | ICD-10-CM | POA: Diagnosis not present

## 2022-11-04 DIAGNOSIS — D3A012 Benign carcinoid tumor of the ileum: Secondary | ICD-10-CM | POA: Insufficient documentation

## 2022-11-04 DIAGNOSIS — C7A012 Malignant carcinoid tumor of the ileum: Secondary | ICD-10-CM | POA: Diagnosis not present

## 2022-11-04 DIAGNOSIS — C179 Malignant neoplasm of small intestine, unspecified: Secondary | ICD-10-CM

## 2022-11-04 LAB — CBC WITH DIFFERENTIAL (CANCER CENTER ONLY)
Abs Immature Granulocytes: 0.01 10*3/uL (ref 0.00–0.07)
Basophils Absolute: 0 10*3/uL (ref 0.0–0.1)
Basophils Relative: 1 %
Eosinophils Absolute: 0.1 10*3/uL (ref 0.0–0.5)
Eosinophils Relative: 3 %
HCT: 33.4 % — ABNORMAL LOW (ref 36.0–46.0)
Hemoglobin: 11.2 g/dL — ABNORMAL LOW (ref 12.0–15.0)
Immature Granulocytes: 0 %
Lymphocytes Relative: 26 %
Lymphs Abs: 1.1 10*3/uL (ref 0.7–4.0)
MCH: 30.6 pg (ref 26.0–34.0)
MCHC: 33.5 g/dL (ref 30.0–36.0)
MCV: 91.3 fL (ref 80.0–100.0)
Monocytes Absolute: 0.3 10*3/uL (ref 0.1–1.0)
Monocytes Relative: 6 %
Neutro Abs: 2.7 10*3/uL (ref 1.7–7.7)
Neutrophils Relative %: 64 %
Platelet Count: 249 10*3/uL (ref 150–400)
RBC: 3.66 MIL/uL — ABNORMAL LOW (ref 3.87–5.11)
RDW: 13.2 % (ref 11.5–15.5)
WBC Count: 4.3 10*3/uL (ref 4.0–10.5)
nRBC: 0 % (ref 0.0–0.2)

## 2022-11-04 LAB — CMP (CANCER CENTER ONLY)
ALT: 6 U/L (ref 0–44)
AST: 12 U/L — ABNORMAL LOW (ref 15–41)
Albumin: 3.8 g/dL (ref 3.5–5.0)
Alkaline Phosphatase: 42 U/L (ref 38–126)
Anion gap: 6 (ref 5–15)
BUN: 16 mg/dL (ref 8–23)
CO2: 29 mmol/L (ref 22–32)
Calcium: 9.4 mg/dL (ref 8.9–10.3)
Chloride: 105 mmol/L (ref 98–111)
Creatinine: 0.86 mg/dL (ref 0.44–1.00)
GFR, Estimated: >60 mL/min (ref >60)
Glucose, Bld: 100 mg/dL — ABNORMAL HIGH (ref 70–99)
Potassium: 4.1 mmol/L (ref 3.5–5.1)
Sodium: 140 mmol/L (ref 135–145)
Total Bilirubin: 0.4 mg/dL (ref 0.3–1.2)
Total Protein: 7.3 g/dL (ref 6.5–8.1)

## 2022-11-04 NOTE — Patient Instructions (Addendum)
It was very nice meeting you today. -I know we covered a lot of important information.  Here is a summary of the main take away points. -It was a tumor in the small intestines called a carcinoid tumor.  This is also called a neuroendocrine tumor -Your tumor is very slow-growing.  -Dr. Michaell Cowing took the tumor out.  He also removed some nearby lymph nodes.  It does look like the tumor spread to 2 of the 3 lymph nodes that were removed.  There was also a deposit of the tumor on the outside of the bowel as well that was surgically removed.  -We need a special scan to check to make sure that there is no residual tumor in the body.  This will take a few weeks to get scheduled.  Clydie Braun will help facilitate this being scheduled.  If this is negative, then we will monitor you with blood work every 6 months and scans once a year or once every 2 years. -Because there were lymph nodes involved and that deposit in the abdomen of tumor there is a 30 to 40% chance that the cancer could come back at some point in the future.  This is a slow-growing tumor fortunately. -Course, we would always want you to reach out to [place] if he had any new symptoms such as abdominal pain or bloating. -We would like you to perform a 24-hour urine test to get baseline levels of a marker that we monitor.  He also would like a blood test.

## 2022-11-04 NOTE — Progress Notes (Signed)
I met with Mrs Deschner and her husband after her consultation with Cassie Heilengotter PA-C and Dr Mosetta Putt.  I explained my role as a nurse navigator and provided my contact information.

## 2022-11-08 ENCOUNTER — Inpatient Hospital Stay (HOSPITAL_COMMUNITY)
Admission: EM | Admit: 2022-11-08 | Discharge: 2022-11-12 | DRG: 389 | Disposition: A | Discharge status: Home / Self Care | Payer: Medicare HMO | Attending: Internal Medicine | Admitting: Internal Medicine

## 2022-11-08 ENCOUNTER — Emergency Department (HOSPITAL_COMMUNITY): Payer: Medicare HMO

## 2022-11-08 ENCOUNTER — Other Ambulatory Visit: Payer: Self-pay

## 2022-11-08 DIAGNOSIS — B9681 Helicobacter pylori [H. pylori] as the cause of diseases classified elsewhere: Secondary | ICD-10-CM | POA: Diagnosis present

## 2022-11-08 DIAGNOSIS — E872 Acidosis, unspecified: Secondary | ICD-10-CM | POA: Diagnosis present

## 2022-11-08 DIAGNOSIS — R739 Hyperglycemia, unspecified: Secondary | ICD-10-CM | POA: Diagnosis not present

## 2022-11-08 DIAGNOSIS — E876 Hypokalemia: Secondary | ICD-10-CM | POA: Diagnosis present

## 2022-11-08 DIAGNOSIS — Z833 Family history of diabetes mellitus: Secondary | ICD-10-CM | POA: Diagnosis not present

## 2022-11-08 DIAGNOSIS — Z96641 Presence of right artificial hip joint: Secondary | ICD-10-CM | POA: Diagnosis present

## 2022-11-08 DIAGNOSIS — E785 Hyperlipidemia, unspecified: Secondary | ICD-10-CM | POA: Diagnosis present

## 2022-11-08 DIAGNOSIS — R109 Unspecified abdominal pain: Secondary | ICD-10-CM | POA: Diagnosis present

## 2022-11-08 DIAGNOSIS — Z87891 Personal history of nicotine dependence: Secondary | ICD-10-CM | POA: Diagnosis not present

## 2022-11-08 DIAGNOSIS — K219 Gastro-esophageal reflux disease without esophagitis: Secondary | ICD-10-CM | POA: Diagnosis present

## 2022-11-08 DIAGNOSIS — C7A012 Malignant carcinoid tumor of the ileum: Secondary | ICD-10-CM | POA: Diagnosis present

## 2022-11-08 DIAGNOSIS — Z4682 Encounter for fitting and adjustment of non-vascular catheter: Secondary | ICD-10-CM | POA: Diagnosis not present

## 2022-11-08 DIAGNOSIS — D259 Leiomyoma of uterus, unspecified: Secondary | ICD-10-CM | POA: Diagnosis not present

## 2022-11-08 DIAGNOSIS — R1084 Generalized abdominal pain: Secondary | ICD-10-CM | POA: Diagnosis not present

## 2022-11-08 DIAGNOSIS — I10 Essential (primary) hypertension: Secondary | ICD-10-CM | POA: Diagnosis present

## 2022-11-08 DIAGNOSIS — R531 Weakness: Secondary | ICD-10-CM | POA: Diagnosis not present

## 2022-11-08 DIAGNOSIS — K2970 Gastritis, unspecified, without bleeding: Secondary | ICD-10-CM | POA: Diagnosis present

## 2022-11-08 DIAGNOSIS — Z8619 Personal history of other infectious and parasitic diseases: Secondary | ICD-10-CM | POA: Diagnosis not present

## 2022-11-08 DIAGNOSIS — K565 Intestinal adhesions [bands], unspecified as to partial versus complete obstruction: Secondary | ICD-10-CM | POA: Diagnosis not present

## 2022-11-08 DIAGNOSIS — K56609 Unspecified intestinal obstruction, unspecified as to partial versus complete obstruction: Secondary | ICD-10-CM | POA: Diagnosis not present

## 2022-11-08 DIAGNOSIS — Z4659 Encounter for fitting and adjustment of other gastrointestinal appliance and device: Secondary | ICD-10-CM | POA: Diagnosis not present

## 2022-11-08 DIAGNOSIS — Z9049 Acquired absence of other specified parts of digestive tract: Secondary | ICD-10-CM | POA: Diagnosis not present

## 2022-11-08 DIAGNOSIS — K8020 Calculus of gallbladder without cholecystitis without obstruction: Secondary | ICD-10-CM | POA: Diagnosis not present

## 2022-11-08 LAB — COMPREHENSIVE METABOLIC PANEL
ALT: 10 U/L (ref 0–44)
AST: 18 U/L (ref 15–41)
Albumin: 4 g/dL (ref 3.5–5.0)
Alkaline Phosphatase: 42 U/L (ref 38–126)
Anion gap: 16 — ABNORMAL HIGH (ref 5–15)
BUN: 13 mg/dL (ref 8–23)
CO2: 20 mmol/L — ABNORMAL LOW (ref 22–32)
Calcium: 9.6 mg/dL (ref 8.9–10.3)
Chloride: 103 mmol/L (ref 98–111)
Creatinine, Ser: 0.98 mg/dL (ref 0.44–1.00)
GFR, Estimated: >60 mL/min (ref >60)
Glucose, Bld: 135 mg/dL — ABNORMAL HIGH (ref 70–99)
Potassium: 3.2 mmol/L — ABNORMAL LOW (ref 3.5–5.1)
Sodium: 139 mmol/L (ref 135–145)
Total Bilirubin: 1 mg/dL (ref 0.3–1.2)
Total Protein: 7.9 g/dL (ref 6.5–8.1)

## 2022-11-08 LAB — URINALYSIS, ROUTINE W REFLEX MICROSCOPIC
Bilirubin Urine: NEGATIVE
Glucose, UA: NEGATIVE mg/dL
Hgb urine dipstick: NEGATIVE
Ketones, ur: 80 mg/dL — AB
Leukocytes,Ua: NEGATIVE
Nitrite: NEGATIVE
Protein, ur: NEGATIVE mg/dL
Specific Gravity, Urine: 1.016 (ref 1.005–1.030)
pH: 7 (ref 5.0–8.0)

## 2022-11-08 LAB — CBC
HCT: 37.6 % (ref 36.0–46.0)
Hemoglobin: 12.7 g/dL (ref 12.0–15.0)
MCH: 30.2 pg (ref 26.0–34.0)
MCHC: 33.8 g/dL (ref 30.0–36.0)
MCV: 89.3 fL (ref 80.0–100.0)
Platelets: 267 10*3/uL (ref 150–400)
RBC: 4.21 MIL/uL (ref 3.87–5.11)
RDW: 13.2 % (ref 11.5–15.5)
WBC: 6.8 10*3/uL (ref 4.0–10.5)
nRBC: 0 % (ref 0.0–0.2)

## 2022-11-08 LAB — LIPASE, BLOOD: Lipase: 26 U/L (ref 11–51)

## 2022-11-08 MED ORDER — ONDANSETRON HCL 4 MG/2ML IJ SOLN
4.0000 mg | Freq: Once | INTRAMUSCULAR | Status: AC
  Administered 2022-11-08: 4 mg via INTRAVENOUS
  Filled 2022-11-08: qty 2

## 2022-11-08 MED ORDER — HYDROMORPHONE HCL 1 MG/ML IJ SOLN
1.0000 mg | Freq: Once | INTRAMUSCULAR | Status: AC
  Administered 2022-11-08: 1 mg via INTRAVENOUS
  Filled 2022-11-08: qty 1

## 2022-11-08 MED ORDER — SODIUM CHLORIDE 0.9 % IV BOLUS
1000.0000 mL | Freq: Once | INTRAVENOUS | Status: AC
  Administered 2022-11-08: 1000 mL via INTRAVENOUS

## 2022-11-08 MED ORDER — ONDANSETRON 4 MG PO TBDP
4.0000 mg | ORAL_TABLET | Freq: Once | ORAL | Status: AC | PRN
  Administered 2022-11-08: 4 mg via ORAL
  Filled 2022-11-08: qty 1

## 2022-11-08 NOTE — ED Triage Notes (Signed)
Pt arrives c/o upper abd pain and n/v. Started with nausea this AM, started vomiting 1-2 hours ago. Pt vomiting in triage. Also c/o chills. Hx of small bowel carcinoma - had ileal mass removed on 5/3. Hasn't started chemo or radiation yet. Pt appears very uncomfortable in triage.

## 2022-11-09 ENCOUNTER — Encounter (HOSPITAL_COMMUNITY): Payer: Self-pay

## 2022-11-09 ENCOUNTER — Inpatient Hospital Stay (HOSPITAL_COMMUNITY): Payer: Medicare HMO

## 2022-11-09 DIAGNOSIS — K56609 Unspecified intestinal obstruction, unspecified as to partial versus complete obstruction: Secondary | ICD-10-CM | POA: Diagnosis present

## 2022-11-09 DIAGNOSIS — K565 Intestinal adhesions [bands], unspecified as to partial versus complete obstruction: Secondary | ICD-10-CM | POA: Diagnosis present

## 2022-11-09 DIAGNOSIS — E785 Hyperlipidemia, unspecified: Secondary | ICD-10-CM | POA: Diagnosis present

## 2022-11-09 DIAGNOSIS — C7A012 Malignant carcinoid tumor of the ileum: Secondary | ICD-10-CM | POA: Diagnosis present

## 2022-11-09 DIAGNOSIS — Z96641 Presence of right artificial hip joint: Secondary | ICD-10-CM | POA: Diagnosis present

## 2022-11-09 DIAGNOSIS — D259 Leiomyoma of uterus, unspecified: Secondary | ICD-10-CM | POA: Diagnosis present

## 2022-11-09 DIAGNOSIS — R739 Hyperglycemia, unspecified: Secondary | ICD-10-CM | POA: Diagnosis present

## 2022-11-09 DIAGNOSIS — Z833 Family history of diabetes mellitus: Secondary | ICD-10-CM | POA: Diagnosis not present

## 2022-11-09 DIAGNOSIS — Z4659 Encounter for fitting and adjustment of other gastrointestinal appliance and device: Secondary | ICD-10-CM | POA: Diagnosis not present

## 2022-11-09 DIAGNOSIS — Z87891 Personal history of nicotine dependence: Secondary | ICD-10-CM | POA: Diagnosis not present

## 2022-11-09 DIAGNOSIS — R531 Weakness: Secondary | ICD-10-CM | POA: Diagnosis present

## 2022-11-09 DIAGNOSIS — K2970 Gastritis, unspecified, without bleeding: Secondary | ICD-10-CM | POA: Diagnosis present

## 2022-11-09 DIAGNOSIS — K8020 Calculus of gallbladder without cholecystitis without obstruction: Secondary | ICD-10-CM | POA: Diagnosis not present

## 2022-11-09 DIAGNOSIS — Z8619 Personal history of other infectious and parasitic diseases: Secondary | ICD-10-CM | POA: Diagnosis not present

## 2022-11-09 DIAGNOSIS — E872 Acidosis, unspecified: Secondary | ICD-10-CM | POA: Diagnosis present

## 2022-11-09 DIAGNOSIS — R109 Unspecified abdominal pain: Secondary | ICD-10-CM | POA: Diagnosis present

## 2022-11-09 DIAGNOSIS — E876 Hypokalemia: Secondary | ICD-10-CM | POA: Diagnosis present

## 2022-11-09 DIAGNOSIS — I10 Essential (primary) hypertension: Secondary | ICD-10-CM | POA: Diagnosis present

## 2022-11-09 DIAGNOSIS — Z4682 Encounter for fitting and adjustment of non-vascular catheter: Secondary | ICD-10-CM | POA: Diagnosis not present

## 2022-11-09 DIAGNOSIS — K219 Gastro-esophageal reflux disease without esophagitis: Secondary | ICD-10-CM | POA: Diagnosis present

## 2022-11-09 DIAGNOSIS — Z9049 Acquired absence of other specified parts of digestive tract: Secondary | ICD-10-CM | POA: Diagnosis not present

## 2022-11-09 LAB — BASIC METABOLIC PANEL
Anion gap: 8 (ref 5–15)
BUN: 11 mg/dL (ref 8–23)
CO2: 26 mmol/L (ref 22–32)
Calcium: 8.8 mg/dL — ABNORMAL LOW (ref 8.9–10.3)
Chloride: 107 mmol/L (ref 98–111)
Creatinine, Ser: 0.9 mg/dL (ref 0.44–1.00)
GFR, Estimated: >60 mL/min (ref >60)
Glucose, Bld: 121 mg/dL — ABNORMAL HIGH (ref 70–99)
Potassium: 3.7 mmol/L (ref 3.5–5.1)
Sodium: 141 mmol/L (ref 135–145)

## 2022-11-09 LAB — CBC
HCT: 34.3 % — ABNORMAL LOW (ref 36.0–46.0)
Hemoglobin: 11.2 g/dL — ABNORMAL LOW (ref 12.0–15.0)
MCH: 30.1 pg (ref 26.0–34.0)
MCHC: 32.7 g/dL (ref 30.0–36.0)
MCV: 92.2 fL (ref 80.0–100.0)
Platelets: 225 10*3/uL (ref 150–400)
RBC: 3.72 MIL/uL — ABNORMAL LOW (ref 3.87–5.11)
RDW: 13.2 % (ref 11.5–15.5)
WBC: 4.7 10*3/uL (ref 4.0–10.5)
nRBC: 0 % (ref 0.0–0.2)

## 2022-11-09 LAB — MAGNESIUM: Magnesium: 2 mg/dL (ref 1.7–2.4)

## 2022-11-09 LAB — PREALBUMIN: Prealbumin: 26 mg/dL (ref 18–38)

## 2022-11-09 LAB — PHOSPHORUS: Phosphorus: 4.1 mg/dL (ref 2.5–4.6)

## 2022-11-09 MED ORDER — BISACODYL 10 MG RE SUPP
10.0000 mg | Freq: Every day | RECTAL | Status: DC
  Administered 2022-11-09 – 2022-11-10 (×2): 10 mg via RECTAL
  Filled 2022-11-09 (×2): qty 1

## 2022-11-09 MED ORDER — DIPHENHYDRAMINE HCL 50 MG/ML IJ SOLN: 12.5000 mg | Freq: Four times a day (QID) | INTRAMUSCULAR | Status: DC | PRN

## 2022-11-09 MED ORDER — MENTHOL 3 MG MT LOZG: 1.0000 | LOZENGE | OROMUCOSAL | Status: DC | PRN

## 2022-11-09 MED ORDER — PROCHLORPERAZINE EDISYLATE 10 MG/2ML IJ SOLN: 5.0000 mg | Freq: Four times a day (QID) | INTRAMUSCULAR | Status: DC | PRN

## 2022-11-09 MED ORDER — LACTATED RINGERS IV BOLUS: 1000.0000 mL | Freq: Three times a day (TID) | INTRAVENOUS | Status: AC | PRN

## 2022-11-09 MED ORDER — METOCLOPRAMIDE HCL 5 MG/ML IJ SOLN: 5.0000 mg | Freq: Three times a day (TID) | INTRAMUSCULAR | Status: DC | PRN

## 2022-11-09 MED ORDER — POTASSIUM CHLORIDE 2 MEQ/ML IV SOLN
INTRAVENOUS | Status: DC
  Filled 2022-11-09 (×3): qty 1000

## 2022-11-09 MED ORDER — HYDROMORPHONE HCL 1 MG/ML IJ SOLN
0.5000 mg | INTRAMUSCULAR | Status: DC | PRN
  Administered 2022-11-09 (×2): 0.5 mg via INTRAVENOUS
  Filled 2022-11-09 (×3): qty 0.5

## 2022-11-09 MED ORDER — MAGIC MOUTHWASH: 15.0000 mL | Freq: Four times a day (QID) | ORAL | Status: DC | PRN

## 2022-11-09 MED ORDER — LORAZEPAM 2 MG/ML IJ SOLN
0.5000 mg | Freq: Once | INTRAMUSCULAR | Status: AC
  Administered 2022-11-09: 0.5 mg via INTRAVENOUS
  Filled 2022-11-09: qty 1

## 2022-11-09 MED ORDER — DIAZEPAM 5 MG/ML IJ SOLN: 5.0000 mg | Freq: Three times a day (TID) | INTRAMUSCULAR | Status: DC | PRN

## 2022-11-09 MED ORDER — POTASSIUM CHLORIDE 10 MEQ/100ML IV SOLN
10.0000 meq | INTRAVENOUS | Status: AC
  Administered 2022-11-09 (×2): 10 meq via INTRAVENOUS
  Filled 2022-11-09: qty 100

## 2022-11-09 MED ORDER — LIP MEDEX EX OINT
TOPICAL_OINTMENT | Freq: Two times a day (BID) | CUTANEOUS | Status: DC
  Administered 2022-11-09 – 2022-11-11 (×3): 75 via TOPICAL
  Filled 2022-11-09: qty 7

## 2022-11-09 MED ORDER — IOHEXOL 300 MG/ML  SOLN
100.0000 mL | Freq: Once | INTRAMUSCULAR | Status: AC | PRN
  Administered 2022-11-09: 75 mL via INTRAVENOUS

## 2022-11-09 MED ORDER — PHENOL 1.4 % MT LIQD
1.0000 | OROMUCOSAL | Status: DC | PRN
  Administered 2022-11-09: 1 via OROMUCOSAL
  Filled 2022-11-09: qty 177

## 2022-11-09 MED ORDER — ENOXAPARIN SODIUM 40 MG/0.4ML IJ SOSY
40.0000 mg | PREFILLED_SYRINGE | INTRAMUSCULAR | Status: DC
  Administered 2022-11-09 – 2022-11-12 (×4): 40 mg via SUBCUTANEOUS
  Filled 2022-11-09 (×4): qty 0.4

## 2022-11-09 MED ORDER — LACTATED RINGERS IV BOLUS
1000.0000 mL | Freq: Once | INTRAVENOUS | Status: AC
  Administered 2022-11-09: 1000 mL via INTRAVENOUS

## 2022-11-09 MED ORDER — PROCHLORPERAZINE EDISYLATE 10 MG/2ML IJ SOLN: 5.0000 mg | INTRAMUSCULAR | Status: DC | PRN

## 2022-11-09 MED ORDER — ALUM & MAG HYDROXIDE-SIMETH 200-200-20 MG/5ML PO SUSP: 30.0000 mL | Freq: Four times a day (QID) | ORAL | Status: DC | PRN

## 2022-11-09 MED ORDER — SIMETHICONE 80 MG PO CHEW
80.0000 mg | CHEWABLE_TABLET | Freq: Four times a day (QID) | ORAL | Status: AC
  Administered 2022-11-10 – 2022-11-11 (×8): 80 mg via ORAL
  Filled 2022-11-09 (×8): qty 1

## 2022-11-09 MED ORDER — HYDRALAZINE HCL 20 MG/ML IJ SOLN
5.0000 mg | Freq: Four times a day (QID) | INTRAMUSCULAR | Status: DC | PRN
  Administered 2022-11-09: 5 mg via INTRAVENOUS
  Filled 2022-11-09: qty 1

## 2022-11-09 MED ORDER — PANTOPRAZOLE SODIUM 40 MG IV SOLR: 40.0000 mg | Freq: Every day | INTRAVENOUS | Status: DC

## 2022-11-09 MED ORDER — PHENOL 1.4 % MT LIQD: 2.0000 | OROMUCOSAL | Status: DC | PRN

## 2022-11-09 MED ORDER — PANTOPRAZOLE SODIUM 40 MG IV SOLR
40.0000 mg | Freq: Two times a day (BID) | INTRAVENOUS | Status: DC
  Administered 2022-11-09 – 2022-11-10 (×4): 40 mg via INTRAVENOUS
  Filled 2022-11-09 (×4): qty 10

## 2022-11-09 NOTE — Progress Notes (Signed)
72 year old female pt presented to the ED one month post-op abd surgery with complaints of upper abdominal pain with nausea, vomiting and chills. Pt is alert and oriented x4 with no motor deficits noted. Lungs clear to auscultation in all lobes. Heart rate and rhythm normal with no murmurs noted. Ct scan confirmed postoperative ileus most likely due to inflammation. Pt is relieved of abd pain with NG tube decompression and IV fluids. Surgery states that pt has no evidence of peritonitis or gastric leaks. No current need to additional surgery. Pt will continue to receive IV fluids and NG decompression and evaluation.     Michiel Cowboy, BSN student

## 2022-11-09 NOTE — Progress Notes (Signed)
Christy Kirby [id number] 11/23/1950  CARE TEAM:  PCP: Nelwyn Salisbury, MD  Outpatient Care Team: Patient Care Team: Nelwyn Salisbury, MD as PCP - Erline Hau, MD as Consulting Physician (General Surgery) Hilarie Fredrickson, MD as Consulting Physician (Gastroenterology) Malachy Mood, MD as Consulting Physician (Oncology)  Inpatient Treatment Team: Treatment Team: Attending Provider: [place], Eric J, DO; Consulting Physician: Bishop Limbo, MD; Rounding Team: Lilyan Gilford, MD; Charge Nurse: Hilliard Clark, RN; Nursing Instructor: Orlin Hilding, RN; Registered Nurse: Diona Browner, RN; Occupational Therapist: Reuben Likes, OT; Student Nurse: Raynaldo Opitz; Consulting Physician: Karie Soda, MD; Pharmacist: Josefa Half, [facility]   Problem List:   Principal Problem:   SBO (small bowel obstruction) (HCC)      10/07/2022  POST-OPERATIVE DIAGNOSIS:   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  Not a classic stellate carcinoid but suspicious versus primary cancer.  No obvious metastatic disease on visceral parietal peritoneum or liver.  Long torturous appendix with adhesions -appendectomy done.   It is an isoperistaltic ileo-ileal anastomosis that rests in the pelvic region.  FINAL MICROSCOPIC DIAGNOSIS:  A. VERIFORM APPENDIX, APPENDECTOMY: - Benign appendix with no specific histopathologic changes  B. ILEUM MASS, RESECTION: - Well-differentiated neuroendocrine tumor (G1; low-grade), 1.5 cm, involving ileum - Tumor involves the serosal surface -  Resection margins are negative for tumor - Metastatic carcinoma to two thank you of three lymph nodes (2/3) - Mesenteric tumor deposit, 2.7 cm - Pathologic Stage Classification (pTNM, AJCC 8th Edition): pT4, pN1   Assessment  Postoperative ileus versus early obstruction most likely due to inflammation and eating large unchewed meal.  [facility] Stay = 0 days)  Plan:  It looks like the distal and anastomosis is transition point.  Usually in such situations less than 3 months it is inflammatory and will resolve on its own.  She has no evidence of peritonitis leak or abscess that raises concerns for need for urgent surgery.  Agree with NG tube decompression.  IV fluids.  Check labs.  No evidence of leukocytosis.  Because this happened rather quickly, hopefully will resolve quickly.  We will see.    Pathology consistent with neuroendocrine carcinoid tumor.  Appears well-differentiated.  Seen by the [facility].  Due for the survival pathway.  -GERD with history of H. pylori gastritis treated with Pylera January - February 2024.  Followed by [facility] gastroenterology.  With persistent nausea will keep at least on low-dose PPI  -Hypertension control.  Persistently high.  Restarted metoprolol 5/7.  Will increase dose gradually.  Backup as needed   -VTE prophylaxis- SCDs, etc  -mobilize as tolerated to help recovery -strongly encouraged her to walk is much as possible.  She is feeling more confident in being more up in the chair in the hallways which is reassuring.  Disposition: TBD       I reviewed nursing notes, last 24 h vitals and pain scores, last 48 h intake and output, last 24 h labs and trends, and last 24 h imaging results. I have reviewed this patient's available data, including medical history, events of note, test  results, etc as part of my evaluation.  A significant portion of that time was spent in counseling.  Care during the described time interval was  provided by me.  This care required moderate level of medical decision making.  11/09/2022    Subjective: (Chief complaint)  Patient noticed abdominal cramping and emesis yesterday.  Was concerned.  Came to ER.  Concern for possible bowel obstruction.  NG tube placed.  She feels better.  No flatus or bowel movement yet.  Objective:  Vital signs:  Vitals:   11/09/22 0354 11/09/22 0423 11/09/22 0546 11/09/22 0652  BP: (!) 157/84  (!) 152/89 (!) 145/79  Pulse: 95  95 94  Resp: 18  16   Temp: 98.5 F (36.9 C)  98.5 F (36.9 C)   TempSrc: Oral  Oral   SpO2: 100%  100%   Weight:  61.2 kg    Height:  5' 4.5" (1.638 m)      Last BM Date : 11/08/22  Intake/Output   Yesterday:  06/04 0701 - 06/05 0700 In: 244.1 [I.V.:131.6; IV Piggyback:112.5] Out: 0  This shift:  No intake/output data recorded.  Bowel function:  Flatus: No  BM:  No  NGT: Bilious   Physical Exam:  General: Pt awake/alert in no acute distress.  Not toxic nor sickly. Eyes: PERRL, normal EOM.  Sclera clear.  No icterus Neuro: CN II-XII intact w/o focal sensory/motor deficits. Lymph: No head/neck/groin lymphadenopathy Psych:  No delerium/psychosis/paranoia.  Oriented x 4.    HENT: Normocephalic, Mucus membranes moist.  No thrush.  NG tube in place with thin bilious output Neck: Supple, No tracheal deviation.  No obvious thyromegaly Chest: No pain to chest wall compression.  Good respiratory excursion.  No audible wheezing CV:  Pulses intact.  Regular rhythm.  No major extremity edema MS: Normal AROM mjr joints.  No obvious deformity  Abdomen: Soft.  Mildy distended.  Nontender.  Incisions clean dry and intact.  No evidence of peritonitis.  No incarcerated hernias.  Ext:   No deformity.  No mjr edema.  No cyanosis Skin: No petechiae / purpurea.  No major sores.  Warm and dry    Results:    SURGICAL PATHOLOGY CASE: [id number] PATIENT: Christy Kirby Surgical Pathology  Report     Clinical History: Mass in small intestine, probable carcinoid (crm)     FINAL MICROSCOPIC DIAGNOSIS:  A. VERIFORM APPENDIX, APPENDECTOMY: - Benign appendix with no specific histopathologic changes  B. ILEUM MASS, RESECTION: - Well-differentiated neuroendocrine tumor (G1; low-grade), 1.5 cm, involving ileum - Tumor involves the serosal surface - Resection margins are negative for tumor - Metastatic carcinoma to two thank you of three lymph nodes (2/3) - Mesenteric tumor deposit, 2.7 cm - See oncology table      ONCOLOGY TABLE:  JEJUNUM AND ILEUM NEUROENDOCRINE TUMOR: Resection  Procedure: Resection, ileum Tumor Site: Ileum Tumor Size: 1.5 cm Tumor Focality: Unifocal Histologic Type and Grade: Low-grade (G1)      Mitotic Rate: 1/10 HPF      Ki-67 Labeling Index: Approximately 1% Tumor Extension: Tumor invades into serosal surface Lymphovascular Invasion: Not identified Large Mesenteric Masses (>2 cm): Present Margins: All margins negative for tumor Regional Lymph Nodes:      Number of Lymph Nodes with Tumor: 1      Number of Lymph Nodes Examined: 2 Distant metastasis:      Distant Site(s) Involved: Not applicable Pathologic Stage Classification (pTNM, AJCC 8th Edition): pT4, pN1 Ancillary Studies: Can be performed upon  request Representative Tumor Block: B4 Comment(s): None (v1.1.0.0)      [place] [facility] DESCRIPTION:  Specimen A: Received fresh is a 9.7 cm in length and averaging 0.6 cm in diameter appendix which has a tan-pink smooth serosa and attached soft fatty tissue.  Cut surfaces are unremarkable.  Representative sections are submitted in 1 block.  Specimen B: Received fresh is a 23.5 cm unoriented length of intestine grossly consistent with small bowel and clinically identified as ileum. The serosa has an area of pale yellow indurated nodularity with umbilication 8.5 cm from 1 margin.  There is also attached mesentery which is focally  involved with firm nodular tissue near the area of nodular umbilicated serosa.  On opening, there is a 1.5 cm in diameter and up to 0.8 cm thick yellow-white firm ill-defined intramural mass which has overlying pink-red smooth mucosa.  The mass is 8 and 14 cm from the margins of resection.  Remaining specimen has tan-pink to pink-green smooth soft mucosa with normal intestinal folds, and a wall averaging 0.4 cm thick.  The nodular portion of attached mesentery is 2.7 x 2.4 x 1.5 cm and has a pale yellow-white firm ill-defined cut surface.  This firm ill-defined tissue is present at the mesenteric margin (margin inked black).  No lymphoid tissue is identified. Block summary: Block 1 = margin of resection nearest intramural mass Block 2 = margin of resection furthest from intramural mass Blocks 3-6 = intramural mass, entirely submitted Blocks 7, 8 = sections of nodular tissue involving mesentery, including inked mesenteric margin  SW 10/10/2022   Final Diagnosis performed by Holley Bouche, MD.   Electronically signed 10/13/2022 Technical and / or Professional components performed at [facility], [address], [place], [place] [address].  Immunohistochemistry Technical component (if applicable) was performed at [facility]. [address], [address], [place], [place] [address].   IMMUNOHISTOCHEMISTRY DISCLAIMER (if applicable): Some of these immunohistochemical stains may have been developed and the performance characteristics determine by [facility]. Some may not have been cleared or approved by the U.S. Food and Drug Administration. The FDA has determined that such clearance or approval is not necessary. This test is used for clinical purposes. It should not be regarded as investigational or for research. This laboratory is certified under the Clinical Laboratory Improvement Amendments of 1988 (CLIA-88) as qualified to perform high  complexity clinical laboratory testing.  The controls stained appropriately.    Cultures: No results found for this or any previous visit (from the past 720 hour(s)).  Labs: Results for orders placed or performed during the hospital encounter of 11/08/22 (from the past 48 hour(s))  Lipase, blood     Status: None   Collection Time: 11/08/22  8:59 PM  Result Value Ref Range   Lipase 26 11 - 51 U/L    Comment: Performed at [facility], [address], [place], [place] [address]  Comprehensive metabolic panel     Status: Abnormal   Collection Time: 11/08/22  8:59 PM  Result Value Ref Range   Sodium 139 135 - 145 mmol/L   Potassium 3.2 (L) 3.5 - 5.1 mmol/L   Chloride 103 98 - 111 mmol/L   CO2 20 (L) 22 - 32 mmol/L   Glucose, Bld 135 (H) 70 - 99 mg/dL    Comment: Glucose reference range applies only to samples taken after fasting for at least 8 hours.   BUN 13 8 - 23 mg/dL   Creatinine, Ser  0.98 0.44 - 1.00 mg/dL   Calcium 9.6 8.9 - 16.1 mg/dL   Total Protein 7.9 6.5 - 8.1 g/dL   Albumin 4.0 3.5 - 5.0 g/dL   AST 18 15 - 41 U/L   ALT 10 0 - 44 U/L   Alkaline Phosphatase 42 38 - 126 U/L   Total Bilirubin 1.0 0.3 - 1.2 mg/dL   GFR, Estimated >09 >60 mL/min    Comment: (NOTE) Calculated using the CKD-EPI Creatinine Equation (2021)    Anion gap 16 (H) 5 - 15    Comment: Performed at [facility], [address], [place], [place] [address]  CBC     Status: None   Collection Time: 11/08/22  8:59 PM  Result Value Ref Range   WBC 6.8 4.0 - 10.5 K/uL   RBC 4.21 3.87 - 5.11 MIL/uL   Hemoglobin 12.7 12.0 - 15.0 g/dL   HCT 81.1 91.4 - 78.2 %   MCV 89.3 80.0 - 100.0 fL   MCH 30.2 26.0 - 34.0 pg   MCHC 33.8 30.0 - 36.0 g/dL   RDW 95.6 21.3 - 08.6 %   Platelets 267 150 - 400 K/uL   nRBC 0.0 0.0 - 0.2 %    Comment: Performed at [facility], [address] [place], [place] [address]  Urinalysis, Routine w reflex microscopic  -Urine, Clean Catch     Status: Abnormal   Collection Time: 11/08/22  9:30 PM  Result Value Ref Range   Color, Urine YELLOW YELLOW   APPearance CLEAR CLEAR   Specific Gravity, Urine 1.016 1.005 - 1.030   pH 7.0 5.0 - 8.0   Glucose, UA NEGATIVE NEGATIVE mg/dL   Hgb urine dipstick NEGATIVE NEGATIVE   Bilirubin Urine NEGATIVE NEGATIVE   Ketones, ur 80 (A) NEGATIVE mg/dL   Protein, ur NEGATIVE NEGATIVE mg/dL   Nitrite NEGATIVE NEGATIVE   Leukocytes,Ua NEGATIVE NEGATIVE    Comment: Performed at [facility], [address], [place], [place] [address]  CBC     Status: Abnormal   Collection Time: 11/09/22  4:39 AM  Result Value Ref Range   WBC 4.7 4.0 - 10.5 K/uL   RBC 3.72 (L) 3.87 - 5.11 MIL/uL   Hemoglobin 11.2 (L) 12.0 - 15.0 g/dL   HCT 28.4 (L) 13.2 - 44.0 %   MCV 92.2 80.0 - 100.0 fL   MCH 30.1 26.0 - 34.0 pg   MCHC 32.7 30.0 - 36.0 g/dL   RDW 10.2 72.5 - 36.6 %   Platelets 225 150 - 400 K/uL   nRBC 0.0 0.0 - 0.2 %    Comment: Performed at [facility], [address] [place], [place] [address]  Basic metabolic panel     Status: Abnormal   Collection Time: 11/09/22  4:39 AM  Result Value Ref Range   Sodium 141 135 - 145 mmol/L   Potassium 3.7 3.5 - 5.1 mmol/L   Chloride 107 98 - 111 mmol/L   CO2 26 22 - 32 mmol/L   Glucose, Bld 121 (H) 70 - 99 mg/dL    Comment: Glucose reference range applies only to samples taken after fasting for at least 8 hours.   BUN 11 8 - 23 mg/dL   Creatinine, Ser 7.42 0.44 - 1.00 mg/dL   Calcium 8.8 (L) 8.9 - 10.3 mg/dL   GFR, Estimated >59 >56 mL/min    Comment: (NOTE) Calculated using the CKD-EPI Creatinine Equation (2021)    Anion  gap 8 5 - 15    Comment: Performed at [facility], [address], [place], [place] [address]  Magnesium     Status: None   Collection Time: 11/09/22  4:39 AM  Result Value Ref Range   Magnesium 2.0 1.7 - 2.4 mg/dL    Comment: Performed at [facility], [address] [place], [place] [address]  Phosphorus     Status: None   Collection Time: 11/09/22  4:39 AM  Result Value Ref Range   Phosphorus 4.1 2.5 - 4.6 mg/dL    Comment: Performed at [facility], [address] [place], [place] [address]     Imaging / Studies: DG Abdomen 1 View  Result Date: 11/09/2022 CLINICAL DATA:  Repositioning of nasogastric tube. EXAM: ABDOMEN - 1 VIEW COMPARISON:  November 09, 2022 FINDINGS: A nasogastric tube is seen with its distal tip overlying the expected region of the gastric fundus. The distal side hole is seen within the expected region of the body of the stomach. The bowel gas pattern is normal. Radiopaque contrast is seen within the bilateral renal collecting systems. IMPRESSION: Nasogastric tube positioning, as described above. Electronically Signed   By: Aram Candela M.D.   On: 11/09/2022 03:21   DG Abdomen 1 View  Result Date: 11/09/2022 CLINICAL DATA:  NG tube placement. EXAM: ABDOMEN - 1 VIEW COMPARISON:  None Available. FINDINGS: Mildly dilated loops of small bowel in the right lower quadrant measuring up to 3.1 cm. An enteric tube loops in the stomach and courses superiorly into the esophagus. Excreted contrast is noted in the renal collecting systems bilaterally and urinary bladder. Total hip arthroplasty changes are present on the right. IMPRESSION: 1. Mildly distended loops of small bowel in the abdomen, possible ileus versus partial or early small bowel obstruction. 2. Enteric tube loops in the stomach and courses distally in the visualized esophagus. Repositioning is recommended. Electronically Signed   By: Thornell Sartorius M.D.   On: 11/09/2022 03:17   CT ABDOMEN PELVIS W CONTRAST  Result Date: 11/09/2022 CLINICAL DATA:  Acute nonlocalized abdominal pain. Upper abdominal pain, nausea, and vomiting. History of small-bowel carcinoma. Recent ileal mass removed on 10/07/2022. EXAM: CT ABDOMEN AND PELVIS WITH  CONTRAST TECHNIQUE: Multidetector CT imaging of the abdomen and pelvis was performed using the standard protocol following bolus administration of intravenous contrast. RADIATION DOSE REDUCTION: This exam was performed according to the departmental dose-optimization program which includes automated exposure control, adjustment of the mA and/or kV according to patient size and/or use of iterative reconstruction technique. CONTRAST:  75mL OMNIPAQUE IOHEXOL 300 MG/ML  SOLN COMPARISON:  07/14/2022 and 06/07/2022 FINDINGS: Lower chest: Mild atelectasis in the lung bases. Hepatobiliary: No focal liver lesions. Cholelithiasis with several stones in the gallbladder. No wall thickening or inflammatory stranding. No bile duct dilatation. Pancreas: Unremarkable. No pancreatic ductal dilatation or surrounding inflammatory changes. Spleen: Normal in size without focal abnormality. Adrenals/Urinary Tract: Adrenal glands are unremarkable. Kidneys are normal, without renal calculi, focal lesion, or hydronephrosis. Bladder is unremarkable. Stomach/Bowel: Stomach is decompressed. Fluid-filled distended small bowel with distal decompression. Dilatation and fecalization at a right lower quadrant anastomosis. Changes likely to represent small bowel obstruction. No wall thickening or pneumatosis. Mesenteric fluid is likely reactive. Mesenteric scarring and tethering in the right lower quadrant. Colon is mostly decompressed. Vascular/Lymphatic: No significant vascular findings are present. No enlarged abdominal or pelvic lymph nodes. Reproductive: Nodular appearance of the uterus consistent with fibroids. No abnormal adnexal  masses. Other: Small amount of free fluid in the abdomen is likely reactive. No free air. Abdominal wall musculature appears intact. Musculoskeletal: Postoperative right hip arthroplasty. Degenerative changes in the spine. IMPRESSION: Postoperative changes in the right lower quadrant with fecalization of contents in  the level of the anastomosis. Proximal dilated fluid-filled small bowel suggesting small bowel obstruction. Small amount of free fluid in the abdomen and pelvis is likely reactive. Tethering of right lower quadrant mesentery may indicate adhesions. Cholelithiasis without evidence of acute cholecystitis. Uterine fibroids. Electronically Signed   By: Burman Nieves M.D.   On: 11/09/2022 00:37    Medications / Allergies: per chart  Antibiotics: Anti-infectives (From admission, onward)    None         Note: Portions of this report may have been transcribed using voice recognition software. Every effort was made to ensure accuracy; however, inadvertent computerized transcription errors may be present.   Any transcriptional errors that result from this process are unintentional.    Ardeth Sportsman, MD, FACS, MASCRS Esophageal, Gastrointestinal & Colorectal Surgery Robotic and Minimally Invasive Surgery  [facility] [address] [place], [place] [address] [phone] Fax [phone] Main  CONTACT INFORMATION:  Weekday (9AM-5PM): Call [facility] main office at [phone]  Weeknight (5PM-9AM) or Weekend/Holiday: Check [email or web address] (password " [email or web address]") for General Surgery [facility] coverage  (Please, do not use SecureChat as it is not reliable communication to reach operating surgeons for immediate patient care given surgeries/outpatient duties/clinic/cross-coverage/off post-call which would lead to a delay in care.  Epic staff messaging available for outptient concerns, but may not be answered for 48 hours or more).     11/09/2022  7:18 AM

## 2022-11-09 NOTE — TOC CM/SW Note (Signed)
Transition of Care [facility]) - Inpatient Brief Assessment   Patient Details  Name: Christy Kirby MRN: [id number] Date of Birth: 1950-11-22  Transition of Care [facility]) CM/SW Contact:    Amada Jupiter, LCSW Phone Number: 11/09/2022, 10:35 AM   Clinical Narrative: No TOC needs identified.   Transition of Care Asessment: Insurance and Status: Insurance coverage has been reviewed Patient has primary care physician: Yes Home environment has been reviewed: home with spouse Prior level of function:: independent Prior/Current Home Services: No current home services Social Determinants of Health Reivew: SDOH reviewed no interventions necessary Readmission risk has been reviewed: Yes Transition of care needs: no transition of care needs at this time

## 2022-11-09 NOTE — H&P (Signed)
History and Physical  Christy Kirby ZOX:[id number] DOB: 03/11/1951 DOA: 11/08/2022  Referring physician: Dr. Wilkie Aye, EDP  PCP: Nelwyn Salisbury, MD  Outpatient Specialists: General surgery. Patient coming from: Home.  Chief Complaint: Abdominal pain.  HPI: Christy Kirby is a 72 y.o. 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 Michaell Cowing, history of hypertension, no longer on oral antihypertensives due to controlled BPs after weight loss, history of H. pylori gastritis, GERD, osteoarthritis, who presented to [facility] ED from home due to upper abdominal pain that started this morning.  Associated with nausea, vomiting, and subjective chills, 2 hours prior to presentation.    In the ED, with persistent nausea, vomited in triage.  CT abdomen and pelvis with contrast revealed the following findings, Postoperative changes in the right lower quadrant with fecalization of contents in the level of the anastomosis. Proximal dilated fluid-filled small bowel suggesting small bowel obstruction. Small amount of free fluid in the abdomen and pelvis is likely reactive. Tethering of right lower quadrant mesentery may indicate adhesions.  Cholelithiasis without evidence of acute cholecystitis. Uterine fibroids.  The patient received IV antiemetics, IV opioid-based analgesics and 1 L IV fluid bolus NS x 1.  EDP discussed the case with general surgery, who will see in consultation.  NG tube was placed in the ED for small bowel obstruction.  [facility], hospitalist service, was asked to admit.  The patient was admitted to MedSurg unit as inpatient status.  ED Course: Temperature 98.3.  BP 139/85, pulse 98, respiratory 12.  O2 saturation 97 to 100% on room air.  Lab studies remarkable for CBC essentially unremarkable.  Serum potassium 3.2, serum bicarb 20, glucose 135,  anion gap 16, GFR greater than 60.  Review of Systems: Review of systems as noted in the HPI. All other systems reviewed and are negative.   Past Medical History:  Diagnosis Date   Arthritis    hip   GERD (gastroesophageal reflux disease)    Hyperlipidemia    Hypertension    Intractable nausea and vomiting 06/07/2022   Past Surgical History:  Procedure Laterality Date   BIOPSY  06/08/2022   Procedure: BIOPSY;  Surgeon: Meridee Score Netty Starring., MD;  Location: [facility];  Service: Gastroenterology;;   COLONOSCOPY  06/26/2017   per Dr. Marina Goodell, benign polyps, repeat in 10 yrs    ESOPHAGOGASTRODUODENOSCOPY (EGD) WITH PROPOFOL N/A 06/08/2022   Procedure: ESOPHAGOGASTRODUODENOSCOPY (EGD) WITH PROPOFOL;  Surgeon: Lemar Lofty., MD;  Location: [facility];  Service: Gastroenterology;  Laterality: N/A;   NEUROPLASTY / TRANSPOSITION MEDIAN NERVE AT CARPAL TUNNEL BILATERAL     POLYPECTOMY     TOTAL HIP ARTHROPLASTY Right 01/11/2018   Procedure: RIGHT TOTAL HIP ARTHROPLASTY ANTERIOR APPROACH;  Surgeon: Samson Frederic, MD;  Location: [facility];  Service: Orthopedics;  Laterality: Right;   XI ROBOTIC ASSISTED SMALL BOWEL RESECTION N/A 10/07/2022   Procedure: XI ROBOTIC ASSISTED SMALL BOWEL RESECTION, BILATERAL TAP BLOCK, ASSESSMENT OF TISSUE PERFUSSION VIA FIREFLY INJECTION;  Surgeon: Karie Soda, MD;  Location: [facility];  Service: General;  Laterality: N/A;    Social History:  reports that she has quit smoking. Her smoking use included cigarettes. She has never used smokeless tobacco. She reports that she does not currently use alcohol. She reports that she does not use drugs.   Allergies  Allergen Reactions   Codeine Nausea Only   Nsaids Other (See  Comments)    History of gastritis = minimize NSAIDs    Family History  Problem Relation Age of Onset   Diabetes Sister    Colon cancer Neg Hx    Colon polyps Neg Hx    Esophageal cancer Neg Hx    Rectal cancer Neg Hx    Stomach cancer  Neg Hx       Prior to Admission medications   Medication Sig Start Date End Date Taking? Authorizing Provider  acetaminophen (TYLENOL) 325 MG tablet Take 2 tablets (650 mg total) by mouth every 6 (six) hours as needed for mild pain or headache. Patient taking differently: Take 650 mg by mouth every 6 (six) hours as needed for mild pain or headache. Pt states taking 500mg  tabs -2tabs about every6hrs prn 06/10/22   Rodolph Bong, MD  ondansetron (ZOFRAN) 4 MG tablet Take 1 tablet (4 mg total) by mouth every 8 (eight) hours as needed for nausea or vomiting. 10/15/22   Gaynelle Adu, MD  traMADol (ULTRAM) 50 MG tablet Take 1-2 tablets (50-100 mg total) by mouth every 6 (six) hours as needed for moderate pain or severe pain. 10/07/22   Karie Soda, MD    Physical Exam: BP (!) 171/104 (BP Location: Right Arm)   Pulse 96   Temp 98.3 F (36.8 C) (Oral)   Resp 20   SpO2 100%   General: 72 y.o. year-old female well developed well nourished in no acute distress.  Alert and oriented x3. Cardiovascular: Regular rate and rhythm with no rubs or gallops.  No thyromegaly or JVD noted.  No lower extremity edema. 2/4 pulses in all 4 extremities. Respiratory: Clear to auscultation with no wheezes or rales. Good inspiratory effort. Abdomen: Soft nontender nondistended with hypoactive bowel sounds. Muskuloskeletal: No cyanosis, clubbing or edema noted bilaterally Neuro: CN II-XII intact, strength, sensation, reflexes Skin: No ulcerative lesions noted or rashes Psychiatry: Judgement and insight appear normal. Mood is appropriate for condition and setting          Labs on Admission:  Basic Metabolic Panel: Recent Labs  Lab 11/04/22 1221 11/08/22 2059  NA 140 139  K 4.1 3.2*  CL 105 103  CO2 29 20*  GLUCOSE 100* 135*  BUN 16 13  CREATININE 0.86 0.98  CALCIUM 9.4 9.6   Liver Function Tests: Recent Labs  Lab 11/04/22 1221 11/08/22 2059  AST 12* 18  ALT 6 10  ALKPHOS 42 42  BILITOT 0.4 1.0   PROT 7.3 7.9  ALBUMIN 3.8 4.0   Recent Labs  Lab 11/08/22 2059  LIPASE 26   No results for input(s): "AMMONIA" in the last 168 hours. CBC: Recent Labs  Lab 11/04/22 1221 11/08/22 2059  WBC 4.3 6.8  NEUTROABS 2.7  --   HGB 11.2* 12.7  HCT 33.4* 37.6  MCV 91.3 89.3  PLT 249 267   Cardiac Enzymes: No results for input(s): "CKTOTAL", "CKMB", "CKMBINDEX", "TROPONINI" in the last 168 hours.  BNP (last 3 results) No results for input(s): "BNP" in the last 8760 hours.  ProBNP (last 3 results) No results for input(s): "PROBNP" in the last 8760 hours.  CBG: No results for input(s): "GLUCAP" in the last 168 hours.  Radiological Exams on Admission: CT ABDOMEN PELVIS W CONTRAST  Result Date: 11/09/2022 CLINICAL DATA:  Acute nonlocalized abdominal pain. Upper abdominal pain, nausea, and vomiting. History of small-bowel carcinoma. Recent ileal mass removed on 10/07/2022. EXAM: CT ABDOMEN AND PELVIS WITH CONTRAST TECHNIQUE: Multidetector CT imaging of the abdomen  and pelvis was performed using the standard protocol following bolus administration of intravenous contrast. RADIATION DOSE REDUCTION: This exam was performed according to the departmental dose-optimization program which includes automated exposure control, adjustment of the mA and/or kV according to patient size and/or use of iterative reconstruction technique. CONTRAST:  75mL OMNIPAQUE IOHEXOL 300 MG/ML  SOLN COMPARISON:  07/14/2022 and 06/07/2022 FINDINGS: Lower chest: Mild atelectasis in the lung bases. Hepatobiliary: No focal liver lesions. Cholelithiasis with several stones in the gallbladder. No wall thickening or inflammatory stranding. No bile duct dilatation. Pancreas: Unremarkable. No pancreatic ductal dilatation or surrounding inflammatory changes. Spleen: Normal in size without focal abnormality. Adrenals/Urinary Tract: Adrenal glands are unremarkable. Kidneys are normal, without renal calculi, focal lesion, or  hydronephrosis. Bladder is unremarkable. Stomach/Bowel: Stomach is decompressed. Fluid-filled distended small bowel with distal decompression. Dilatation and fecalization at a right lower quadrant anastomosis. Changes likely to represent small bowel obstruction. No wall thickening or pneumatosis. Mesenteric fluid is likely reactive. Mesenteric scarring and tethering in the right lower quadrant. Colon is mostly decompressed. Vascular/Lymphatic: No significant vascular findings are present. No enlarged abdominal or pelvic lymph nodes. Reproductive: Nodular appearance of the uterus consistent with fibroids. No abnormal adnexal masses. Other: Small amount of free fluid in the abdomen is likely reactive. No free air. Abdominal wall musculature appears intact. Musculoskeletal: Postoperative right hip arthroplasty. Degenerative changes in the spine. IMPRESSION: Postoperative changes in the right lower quadrant with fecalization of contents in the level of the anastomosis. Proximal dilated fluid-filled small bowel suggesting small bowel obstruction. Small amount of free fluid in the abdomen and pelvis is likely reactive. Tethering of right lower quadrant mesentery may indicate adhesions. Cholelithiasis without evidence of acute cholecystitis. Uterine fibroids. Electronically Signed   By: Burman Nieves M.D.   On: 11/09/2022 00:37    EKG: I independently viewed the EKG done and my findings are as followed: None available at the time of this visit.  Assessment/Plan Present on Admission:  SBO (small bowel obstruction) (HCC)  Principal Problem:   SBO (small bowel obstruction) (HCC)  Small bowel obstruction, possibly due to adhesions, seen on CT scan, POA SBO protocol in place Repeat abdominal x-ray in the morning IV fluid hydration LR KCl at 10 mill equivalent at 100 cc/h x 2 days Optimize magnesium level greater than 2.0 Optimize potassium level greater than 4.0 Mobilize as tolerated As needed  analgesics General surgery consulted  Hypokalemia Serum potassium 3.2 Repleted intravenously Check magnesium level in the morning.  High anion gap metabolic acidosis Serum bicarb 20, anion gap 16 Continue IV fluid hydration LR at 100 cc/h. Repeat BMP in the morning.  Mild hyperglycemia no prior history of diabetes Presented with serum glucose of 135 Monitor for now.  Insulin sliding scale held to avoid hypoglycemia.  Generalized weakness PT OT assessment    DVT prophylaxis: Subcu Lovenox daily  Code Status: Full code  Family Communication: None at bedside.  Disposition Plan: Admitted to MedSurg unit.  Consults called: General surgery.  Admission status: Inpatient status.   Status is: Inpatient The patient requires at least 2 midnights for further evaluation and treatment of present condition.   Darlin Drop MD [facility] Pager [phone]  If 7PM-7AM, please contact night-coverage [email or web address] Password [email or web address]  11/09/2022, 1:21 AM

## 2022-11-09 NOTE — Evaluation (Signed)
Physical Therapy Evaluation Patient Details Name: Christy Kirby MRN: [id number] DOB: 01-22-1951 Today's Date: 11/09/2022  History of Present Illness  Ms. Lindh is a 72 yr old female admitted to the hospital 11-08-22 with abdominal pain, nausea, and chills. She was found to have a small bowel obstruction and hypokalemia. PMH: neuroendocrine tumor of small bowel s/p resection 10-07-22, HTN,OA  Clinical Impression  Pt admitted with above diagnosis.  She was is normally independent and had been mobilizing well at home after her recent surgery.  Today, pt demonstrated safe mobility - only needing supervision for lines.  Does not have further skilled PT needs.        Recommendations for follow up therapy are one component of a multi-disciplinary discharge planning process, led by the attending physician.  Recommendations may be updated based on patient status, additional functional criteria and insurance authorization.  Follow Up Recommendations       Assistance Recommended at Discharge PRN  Patient can return home with the following  Assistance with cooking/housework    Equipment Recommendations None recommended by PT  Recommendations for Other Services       Functional Status Assessment Patient has not had a recent decline in their functional status     Precautions / Restrictions Precautions Precautions: None Restrictions Weight Bearing Restrictions: No Other Position/Activity Restrictions: NG tube      Mobility  Bed Mobility Overal bed mobility: Independent                  Transfers Overall transfer level: Needs assistance Equipment used: None Transfers: Sit to/from Stand Sit to Stand: Supervision           General transfer comment: Demonstrated safely; supervision for lines only    Ambulation/Gait Ambulation/Gait assistance: Supervision Gait Distance (Feet): 300 Feet Assistive device: IV Pole Gait Pattern/deviations: WFL(Within Functional Limits) Gait  velocity: normal     General Gait Details: Pt able to push her own IV pole; normal gait pattern  Stairs            Wheelchair Mobility    Modified Rankin (Stroke Patients Only)       Balance Overall balance assessment: No apparent balance deficits (not formally assessed)                                           Pertinent Vitals/Pain Pain Assessment Pain Assessment: Faces Faces Pain Scale: Hurts little more Pain Location: Throat from NG tube Pain Intervention(s): Monitored during session, Other (comment) (pt utilized throat spray)    Home Living Family/patient expects to be discharged to:: Private residence Living Arrangements: Alone Available Help at Discharge: Family;Available PRN/intermittently (dtr next door) Type of Home: House Home Access: Level entry       Home Layout: Two level;Able to live on main level with bedroom/bathroom Home Equipment: Rolling Walker (2 wheels);Cane - single point      Prior Function Prior Level of Function : Independent/Modified Independent             Mobility Comments: Independent with ambulation ADLs Comments: She was independent with ADLs, cooking, and cleaning.     Hand Dominance   Dominant Hand: Right    Extremity/Trunk Assessment   Upper Extremity Assessment Upper Extremity Assessment: Overall WFL for tasks assessed    Lower Extremity Assessment Lower Extremity Assessment: Overall WFL for tasks assessed    Cervical / Trunk  Assessment Cervical / Trunk Assessment: Normal  Communication   Communication: No difficulties  Cognition Arousal/Alertness: Awake/alert Behavior During Therapy: WFL for tasks assessed/performed Overall Cognitive Status: Within Functional Limits for tasks assessed                                          General Comments      Exercises     Assessment/Plan    PT Assessment Patient does not need any further PT services  PT Problem List          PT Treatment Interventions      PT Goals (Current goals can be found in the Care Plan section)  Acute Rehab PT Goals Patient Stated Goal: return home PT Goal Formulation: All assessment and education complete, DC therapy    Frequency       Co-evaluation               AM-PAC PT "6 Clicks" Mobility  Outcome Measure Help needed turning from your back to your side while in a flat bed without using bedrails?: None Help needed moving from lying on your back to sitting on the side of a flat bed without using bedrails?: None Help needed moving to and from a bed to a chair (including a wheelchair)?: A Little Help needed standing up from a chair using your arms (e.g., wheelchair or bedside chair)?: A Little Help needed to walk in hospital room?: A Little Help needed climbing 3-5 steps with a railing? : A Little 6 Click Score: 20    End of Session   Activity Tolerance: Patient tolerated treatment well Patient left: in chair;with call bell/phone within reach Nurse Communication: Mobility status PT Visit Diagnosis: Other abnormalities of gait and mobility (R26.89)    Time: 1610-9604 PT Time Calculation (min) (ACUTE ONLY): 14 min   Charges:   PT Evaluation $PT Eval Low Complexity: 1 Low          Denis Koppel, PT Acute Rehab [facility] [phone]   Rayetta Humphrey 11/09/2022, 3:33 PM

## 2022-11-09 NOTE — Progress Notes (Signed)
Initial Nutrition Assessment  INTERVENTION:   -Will monitor for diet advancement and discuss supplement options once diet advanced  -If diet unable to be advanced in next 5-7 days, recommend nutrition support  NUTRITION DIAGNOSIS:   Increased nutrient needs related to cancer and cancer related treatments as evidenced by estimated needs.  GOAL:   Patient will meet greater than or equal to 90% of their needs  MONITOR:   Labs, Weight trends, I & O's, Diet advancement  REASON FOR ASSESSMENT:   Malnutrition Screening Tool    ASSESSMENT:   72 y.o. 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 Michaell Cowing, history of hypertension, no longer on oral antihypertensives due to controlled BPs after weight loss, history of H. pylori gastritis, GERD, osteoarthritis, who presented to [facility] ED from home due to upper abdominal pain.   Associated with nausea, vomiting, and subjective chills, 2 hours prior to presentation.  Patient with NGT in place, set to suction. Output so far today ~50 ml. Pt reports since her surgery on 5/3, she initially didn't tolerate PO, then was eating well and followed a regular diet. Noticed some pains here and there but nothing alarming. Pt reports she was eating fine with good appetite until the day she came to the hospital she ate some BBQ, corn on the cob and some chicken noodle soup. Earlier that day she had raisin bran with milk. She became sick after consuming the soup.  Pt reports she started to gain weight back since her surgery. At her lowest she was ~129 lbs. Now up to 135 lbs. Her UBW ~149 lbs.  Medications: Dulcolax, Lactated ringers, KCl  Labs reviewed.  NUTRITION - FOCUSED PHYSICAL EXAM:  Flowsheet Row Most Recent Value  Orbital Region No depletion  Upper Arm Region No depletion  Thoracic and Lumbar  Region No depletion  Buccal Region No depletion  Temple Region Mild depletion  Clavicle Bone Region Mild depletion  Clavicle and Acromion Bone Region No depletion  Scapular Bone Region No depletion  Dorsal Hand No depletion  Patellar Region No depletion  Anterior Thigh Region No depletion  Posterior Calf Region No depletion  Edema (RD Assessment) None  Hair Unable to assess  [covered]  Eyes Reviewed  Mouth Reviewed  Skin Reviewed       Diet Order:   Diet Order             Diet NPO time specified Except for: Ice Chips  Diet effective now                   EDUCATION NEEDS:   No education needs have been identified at this time  Skin:  Skin Assessment: Reviewed RN Assessment  Last BM:  6/4  Height:   Ht Readings from Last 1 Encounters:  11/09/22 5' 4.5" (1.638 m)    Weight:   Wt Readings from Last 1 Encounters:  11/09/22 61.2 kg    BMI:  Body mass index is 22.81 kg/m.  Estimated Nutritional Needs:   Kcal:  1800-2000  Protein:  90-100g  Fluid:  2L/day   Tilda Franco, MS, RD, LDN Inpatient Clinical Dietitian Contact information available via Amion

## 2022-11-09 NOTE — Progress Notes (Signed)
PROGRESS NOTE    Christy Kirby  BMW:[id number] DOB: Sep 18, 1950 DOA: 11/08/2022 PCP: Nelwyn Salisbury, MD    Brief Narrative:   Christy Kirby is a 72 y.o. 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 Michaell Cowing), HTN no longer on antihypertensive therapy, history of H. pylori gastritis, GERD, osteoarthritis who presented to [facility] ED with abdominal pain.  Associated with nausea/vomiting.  Onset the day of arrival.  In the ED, temperature 98.3 F, HR 96, RR 20, BP 171/104, SpO2 100% on room air.  WBC 6.8, hemoglobin 12.7, platelets 267.  Sodium 139, potassium 3.2, chloride 103, CO2 20, glucose 135, BUN 13, creatinine 0.98.  Urinalysis unrevealing. CT abdomen/pelvis with contrast with postoperative changes right lower quadrant with fecalization of contents in the level of the anastomosis, proximal dilated fluid-filled small bowels suggesting small bowel obstruction, small amount of free fluid in the abdomen and pelvis likely reactive, tethering of right lower quadrant mesentery may indicate adhesions.  General surgery was consulted.  TRH consulted for admission for further evaluation management of small bowel obstruction.  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  Recently underwent resection of her small bowel/ileum due to neuroendocrine tumor by Dr. Michaell Cowing on 10/07/2022.  CT Abdo/pelvis notable for proximal dilated fluid-filled small bowel suggesting obstruction and tethering of right lower quadrant mesentery concerning for adhesions. -- General surgery following, appreciate assistance -- NPO, NG tube to LWIS -- LR at 100 mL/h -- Antiemetics as needed -- Further per general  surgery  Hypokalemia Potassium 3.2 on admission, repleted. -- Repeat electrolytes in a.m.  Hx essential hypertension Currently not on antihypertensive medications outpatient. -- Hydralazine 5 mg IV every 6 hours as needed SBP >150  GERD -- Protonix 40 mg IV every 12 hours while NPO  DVT prophylaxis: enoxaparin (LOVENOX) injection 40 mg Start: 11/09/22 1000    Code Status: Full Code Family Communication: No family present at bedside this morning  Disposition Plan:  Level of care: Med-Surg Status is: Inpatient Remains inpatient appropriate because: N.p.o., NG tube in place, will need further advancement of diet, general surgery signed off before stable for discharge home    Consultants:  General surgery  Procedures:  None  Antimicrobials:  None  Subjective: Seen examined bedside, resting comfortably.  Lying in bed.  NG tube in place to wall suction.  Seen by general surgery, Dr. Michaell Cowing this morning.  Patient reports that her abdomen feels "much better".  Denies flatus.  No other questions or concerns at this time.  Denies headache, no dizziness, no chest pain, no palpitations, no shortness of breath, no current abdominal pain, no fever/chills/night sweats, no current nausea/vomiting, no diarrhea, no focal weakness, no fatigue, no paresthesias.  No acute events overnight per nursing staff.  Objective: Vitals:   11/09/22 0423 11/09/22 0546 11/09/22 0652 11/09/22 0947  BP:  (!) 152/89 (!) 145/79 129/88  Pulse:  95 94 92  Resp:  16  16  Temp:  98.5 F (36.9 C)  98.5 F (36.9 C)  TempSrc:  Oral  Oral  SpO2:  100%  100%  Weight: 61.2 kg     Height: 5' 4.5" (1.638 m)       Intake/Output Summary (Last 24 hours) at 11/09/2022 1343 Last data filed  at 11/09/2022 1242 Gross per 24 hour  Intake 274.08 ml  Output 500 ml  Net -225.92 ml   Filed Weights   11/09/22 0423  Weight: 61.2 kg    Examination:  Physical Exam: GEN: NAD, alert and oriented x 3, wd/wn HEENT: NCAT,  PERRL, EOMI, sclera clear, MMM PULM: CTAB w/o wheezes/crackles, normal respiratory effort CV: RRR w/o M/G/R GI: abd soft, NTND, no appreciable bowel sounds MSK: no peripheral edema, moves all extremity independently NEURO: No focal neurological deficits appreciated PSYCH: normal mood/affect Integumentary: No concerning rash/lesions/wounds noted on exposed skin surfaces    Data Reviewed: I have personally reviewed following labs and imaging studies  CBC: Recent Labs  Lab 11/04/22 1221 11/08/22 2059 11/09/22 0439  WBC 4.3 6.8 4.7  NEUTROABS 2.7  --   --   HGB 11.2* 12.7 11.2*  HCT 33.4* 37.6 34.3*  MCV 91.3 89.3 92.2  PLT 249 267 225   Basic Metabolic Panel: Recent Labs  Lab 11/04/22 1221 11/08/22 2059 11/09/22 0439  NA 140 139 141  K 4.1 3.2* 3.7  CL 105 103 107  CO2 29 20* 26  GLUCOSE 100* 135* 121*  BUN 16 13 11   CREATININE 0.86 0.98 0.90  CALCIUM 9.4 9.6 8.8*  MG  --   --  2.0  PHOS  --   --  4.1   GFR: Estimated Creatinine Clearance: 48.4 mL/min (by C-G formula based on SCr of 0.9 mg/dL). Liver Function Tests: Recent Labs  Lab 11/04/22 1221 11/08/22 2059  AST 12* 18  ALT 6 10  ALKPHOS 42 42  BILITOT 0.4 1.0  PROT 7.3 7.9  ALBUMIN 3.8 4.0   Recent Labs  Lab 11/08/22 2059  LIPASE 26   No results for input(s): "AMMONIA" in the last 168 hours. Coagulation Profile: No results for input(s): "INR", "PROTIME" in the last 168 hours. Cardiac Enzymes: No results for input(s): "CKTOTAL", "CKMB", "CKMBINDEX", "TROPONINI" in the last 168 hours. BNP (last 3 results) No results for input(s): "PROBNP" in the last 8760 hours. HbA1C: No results for input(s): "HGBA1C" in the last 72 hours. CBG: No results for input(s): "GLUCAP" in the last 168 hours. Lipid Profile: No results for input(s): "CHOL", "HDL", "LDLCALC", "TRIG", "CHOLHDL", "LDLDIRECT" in the last 72 hours. Thyroid Function Tests: No results for input(s): "TSH", "T4TOTAL", "FREET4", "T3FREE",  "THYROIDAB" in the last 72 hours. Anemia Panel: No results for input(s): "VITAMINB12", "FOLATE", "FERRITIN", "TIBC", "IRON", "RETICCTPCT" in the last 72 hours. Sepsis Labs: No results for input(s): "PROCALCITON", "LATICACIDVEN" in the last 168 hours.  No results found for this or any previous visit (from the past 240 hour(s)).       Radiology Studies: DG Abd Portable 1V  Result Date: 11/09/2022 CLINICAL DATA:  Small bowel obstruction EXAM: PORTABLE ABDOMEN - 1 VIEW; PORTABLE CHEST - 1 VIEW COMPARISON:  CT scan 06/05/2022 FINDINGS: Nasogastric tube noted with tip in the stomach fundus. The lungs appear clear. No blunting of the costophrenic angles. Heart size within normal limits. No significant bony abnormality observed. IMPRESSION: 1. Nasogastric tube tip is in the stomach fundus. 2. The lungs appear clear. Electronically Signed   By: Gaylyn Rong M.D.   On: 11/09/2022 11:02   DG CHEST PORT 1 VIEW  Result Date: 11/09/2022 CLINICAL DATA:  Small bowel obstruction EXAM: PORTABLE ABDOMEN - 1 VIEW; PORTABLE CHEST - 1 VIEW COMPARISON:  CT scan 06/05/2022 FINDINGS: Nasogastric tube noted with tip in the stomach fundus. The lungs appear clear. No blunting of the costophrenic  angles. Heart size within normal limits. No significant bony abnormality observed. IMPRESSION: 1. Nasogastric tube tip is in the stomach fundus. 2. The lungs appear clear. Electronically Signed   By: Gaylyn Rong M.D.   On: 11/09/2022 11:02   DG Abdomen 1 View  Result Date: 11/09/2022 CLINICAL DATA:  Repositioning of nasogastric tube. EXAM: ABDOMEN - 1 VIEW COMPARISON:  November 09, 2022 FINDINGS: A nasogastric tube is seen with its distal tip overlying the expected region of the gastric fundus. The distal side hole is seen within the expected region of the body of the stomach. The bowel gas pattern is normal. Radiopaque contrast is seen within the bilateral renal collecting systems. IMPRESSION: Nasogastric tube positioning,  as described above. Electronically Signed   By: Aram Candela M.D.   On: 11/09/2022 03:21   DG Abdomen 1 View  Result Date: 11/09/2022 CLINICAL DATA:  NG tube placement. EXAM: ABDOMEN - 1 VIEW COMPARISON:  None Available. FINDINGS: Mildly dilated loops of small bowel in the right lower quadrant measuring up to 3.1 cm. An enteric tube loops in the stomach and courses superiorly into the esophagus. Excreted contrast is noted in the renal collecting systems bilaterally and urinary bladder. Total hip arthroplasty changes are present on the right. IMPRESSION: 1. Mildly distended loops of small bowel in the abdomen, possible ileus versus partial or early small bowel obstruction. 2. Enteric tube loops in the stomach and courses distally in the visualized esophagus. Repositioning is recommended. Electronically Signed   By: Thornell Sartorius M.D.   On: 11/09/2022 03:17   CT ABDOMEN PELVIS W CONTRAST  Result Date: 11/09/2022 CLINICAL DATA:  Acute nonlocalized abdominal pain. Upper abdominal pain, nausea, and vomiting. History of small-bowel carcinoma. Recent ileal mass removed on 10/07/2022. EXAM: CT ABDOMEN AND PELVIS WITH CONTRAST TECHNIQUE: Multidetector CT imaging of the abdomen and pelvis was performed using the standard protocol following bolus administration of intravenous contrast. RADIATION DOSE REDUCTION: This exam was performed according to the departmental dose-optimization program which includes automated exposure control, adjustment of the mA and/or kV according to patient size and/or use of iterative reconstruction technique. CONTRAST:  75mL OMNIPAQUE IOHEXOL 300 MG/ML  SOLN COMPARISON:  07/14/2022 and 06/07/2022 FINDINGS: Lower chest: Mild atelectasis in the lung bases. Hepatobiliary: No focal liver lesions. Cholelithiasis with several stones in the gallbladder. No wall thickening or inflammatory stranding. No bile duct dilatation. Pancreas: Unremarkable. No pancreatic ductal dilatation or surrounding  inflammatory changes. Spleen: Normal in size without focal abnormality. Adrenals/Urinary Tract: Adrenal glands are unremarkable. Kidneys are normal, without renal calculi, focal lesion, or hydronephrosis. Bladder is unremarkable. Stomach/Bowel: Stomach is decompressed. Fluid-filled distended small bowel with distal decompression. Dilatation and fecalization at a right lower quadrant anastomosis. Changes likely to represent small bowel obstruction. No wall thickening or pneumatosis. Mesenteric fluid is likely reactive. Mesenteric scarring and tethering in the right lower quadrant. Colon is mostly decompressed. Vascular/Lymphatic: No significant vascular findings are present. No enlarged abdominal or pelvic lymph nodes. Reproductive: Nodular appearance of the uterus consistent with fibroids. No abnormal adnexal masses. Other: Small amount of free fluid in the abdomen is likely reactive. No free air. Abdominal wall musculature appears intact. Musculoskeletal: Postoperative right hip arthroplasty. Degenerative changes in the spine. IMPRESSION: Postoperative changes in the right lower quadrant with fecalization of contents in the level of the anastomosis. Proximal dilated fluid-filled small bowel suggesting small bowel obstruction. Small amount of free fluid in the abdomen and pelvis is likely reactive. Tethering of right lower quadrant mesentery may indicate  adhesions. Cholelithiasis without evidence of acute cholecystitis. Uterine fibroids. Electronically Signed   By: Burman Nieves M.D.   On: 11/09/2022 00:37        Scheduled Meds:  bisacodyl  10 mg Rectal Daily   enoxaparin (LOVENOX) injection  40 mg Subcutaneous Q24H   lip balm   Topical BID   pantoprazole (PROTONIX) IV  40 mg Intravenous Q12H   simethicone  80 mg Oral QID   Continuous Infusions:  lactated ringers 1,000 mL with potassium chloride 10 mEq infusion 100 mL/hr at 11/09/22 0416   lactated ringers       LOS: 0 days    Time spent: 51  minutes spent on chart review, discussion with nursing staff, consultants, updating family and interview/physical exam; more than 50% of that time was spent in counseling and/or coordination of care.    Alvira Philips [place], DO [facility] Available via Epic secure chat 7am-7pm After these hours, please refer to coverage provider listed on [email or web address] 11/09/2022, 1:43 PM

## 2022-11-09 NOTE — ED Provider Notes (Signed)
[place] EMERGENCY DEPARTMENT AT [facility] Provider Note  CSN: [id number] Arrival date & time: 11/08/22 2031  Chief Complaint(s) Abdominal Pain and Emesis  HPI Christy Kirby is a 72 y.o. female with history of hypertension, hyperlipidemia, ileal mass with resection last month presenting to the emergency department abdominal pain.  Patient reports abdominal pain, nausea and vomiting starting this morning.  She reports multiple episodes of vomiting.  Also reports chills.  No dysuria.  No back pain.  No fevers.  Denies similar symptoms.  Report her abdominal incisions are healing well.   Past Medical History Past Medical History:  Diagnosis Date   Arthritis    hip   GERD (gastroesophageal reflux disease)    Hyperlipidemia    Hypertension    Intractable nausea and vomiting 06/07/2022   Patient Active Problem List   Diagnosis Date Noted   Carcinoid tumor of ileum 11/04/2022   Helicobacter pylori gastritis 10/12/2022   Ileal mass s/p robotic SB resection 10/07/2022 10/07/2022   History of abdominal pain 09/06/2022   Gastroenteritis 06/10/2022   Acute gastritis 06/09/2022   Hypokalemia 06/08/2022   Hypomagnesemia 06/08/2022   Intractable nausea and vomiting 06/07/2022   Hypertensive urgency 06/06/2022   Abdominal pain with vomiting 06/05/2022   HTN (hypertension) 09/30/2020   Osteoarthritis of right hip 01/11/2018   Hyperlipemia, mixed 02/08/2017   HIP PAIN, BILATERAL 04/07/2009   BACK PAIN, LUMBAR 04/07/2009   GERD 03/19/2007   Home Medication(s) Prior to Admission medications   Medication Sig Start Date End Date Taking? Authorizing Provider  acetaminophen (TYLENOL) 325 MG tablet Take 2 tablets (650 mg total) by mouth every 6 (six) hours as needed for mild pain or headache. Patient taking differently: Take 650 mg by mouth every 6 (six) hours as needed for mild pain or headache. Pt states taking 500mg  tabs -2tabs about every6hrs prn 06/10/22   Rodolph Bong,  MD  ondansetron (ZOFRAN) 4 MG tablet Take 1 tablet (4 mg total) by mouth every 8 (eight) hours as needed for nausea or vomiting. 10/15/22   Gaynelle Adu, MD  traMADol (ULTRAM) 50 MG tablet Take 1-2 tablets (50-100 mg total) by mouth every 6 (six) hours as needed for moderate pain or severe pain. 10/07/22   Karie Soda, MD                                                                                                                                    Past Surgical History Past Surgical History:  Procedure Laterality Date   BIOPSY  06/08/2022   Procedure: BIOPSY;  Surgeon: Mansouraty, Netty Starring., MD;  Location: [facility];  Service: Gastroenterology;;   COLONOSCOPY  06/26/2017   per Dr. Marina Goodell, benign polyps, repeat in 10 yrs    ESOPHAGOGASTRODUODENOSCOPY (EGD) WITH PROPOFOL N/A 06/08/2022   Procedure: ESOPHAGOGASTRODUODENOSCOPY (EGD) WITH PROPOFOL;  Surgeon: Lemar Lofty., MD;  Location: [facility];  Service: Gastroenterology;  Laterality:  N/A;   NEUROPLASTY / TRANSPOSITION MEDIAN NERVE AT CARPAL TUNNEL BILATERAL     POLYPECTOMY     TOTAL HIP ARTHROPLASTY Right 01/11/2018   Procedure: RIGHT TOTAL HIP ARTHROPLASTY ANTERIOR APPROACH;  Surgeon: Samson Frederic, MD;  Location: [facility];  Service: Orthopedics;  Laterality: Right;   XI ROBOTIC ASSISTED SMALL BOWEL RESECTION N/A 10/07/2022   Procedure: XI ROBOTIC ASSISTED SMALL BOWEL RESECTION, BILATERAL TAP BLOCK, ASSESSMENT OF TISSUE PERFUSSION VIA FIREFLY INJECTION;  Surgeon: Karie Soda, MD;  Location: [facility];  Service: General;  Laterality: N/A;   Family History Family History  Problem Relation Age of Onset   Diabetes Sister    Colon cancer Neg Hx    Colon polyps Neg Hx    Esophageal cancer Neg Hx    Rectal cancer Neg Hx    Stomach cancer Neg Hx     Social History Social History   Tobacco Use   Smoking status: Former    Years: 1    Types: Cigarettes   Smokeless tobacco: Never   Tobacco comments:    only when [age] years old  briefly   Vaping Use   Vaping Use: Never used  Substance Use Topics   Alcohol use: Not Currently    Comment: occ wine    Drug use: No   Allergies Codeine and Nsaids  Review of Systems Review of Systems  All other systems reviewed and are negative.   Physical Exam Vital Signs  I have reviewed the triage vital signs BP (!) 171/104 (BP Location: Right Arm)   Pulse 96   Temp 98.3 F (36.8 C) (Oral)   Resp 20   SpO2 100%  Physical Exam Vitals and nursing note reviewed.  Constitutional:      General: She is in acute distress.     Appearance: She is well-developed.  HENT:     Head: Normocephalic and atraumatic.     Mouth/Throat:     Mouth: Mucous membranes are moist.  Eyes:     Pupils: Pupils are equal, round, and reactive to light.  Cardiovascular:     Rate and Rhythm: Normal rate and regular rhythm.     Heart sounds: No murmur heard. Pulmonary:     Effort: Pulmonary effort is normal. No respiratory distress.     Breath sounds: Normal breath sounds.  Abdominal:     General: Abdomen is flat.     Palpations: Abdomen is soft.     Tenderness: There is generalized abdominal tenderness.  Musculoskeletal:        General: No tenderness.     Right lower leg: No edema.     Left lower leg: No edema.  Skin:    General: Skin is warm and dry.  Neurological:     General: No focal deficit present.     Mental Status: She is alert. Mental status is at baseline.  Psychiatric:        Mood and Affect: Mood normal.        Behavior: Behavior normal.     ED Results and Treatments Labs (all labs ordered are listed, but only abnormal results are displayed) Labs Reviewed  COMPREHENSIVE METABOLIC PANEL - Abnormal; Notable for the following components:      Result Value   Potassium 3.2 (*)    CO2 20 (*)    Glucose, Bld 135 (*)    Anion gap 16 (*)    All other components within normal limits  URINALYSIS, ROUTINE W REFLEX MICROSCOPIC - Abnormal; Notable for  the following  components:   Ketones, ur 80 (*)    All other components within normal limits  LIPASE, BLOOD  CBC                                                                                                                          Radiology No results found.  Pertinent labs & imaging results that were available during my care of the patient were reviewed by me and considered in my medical decision making (see MDM for details).  Medications Ordered in ED Medications  iohexol (OMNIPAQUE) 300 MG/ML solution 100 mL (has no administration in time range)  ondansetron (ZOFRAN-ODT) disintegrating tablet 4 mg (4 mg Oral Given 11/08/22 2106)  HYDROmorphone (DILAUDID) injection 1 mg (1 mg Intravenous Given 11/08/22 2255)  ondansetron (ZOFRAN) injection 4 mg (4 mg Intravenous Given 11/08/22 2251)  sodium chloride 0.9 % bolus 1,000 mL (1,000 mLs Intravenous New Bag/Given 11/08/22 2254)                                                                                                                                     Procedures Procedures  (including critical care time)  Medical Decision Making / ED Course   MDM:  72 year old female presenting to the emergency department with nausea vomiting.  Patient appears uncomfortable and is actively vomiting.  She has diffuse abdominal tenderness.  Differential includes perforation, obstruction, postoperative abscess or infection, pancreatitis, cholecystitis, volvulus, urinary pathology such as nephrolithiasis or pyelonephritis.  Will treat pain and obtain CT scan of the abdomen to further evaluate for acute intra-abdominal process.  Signed out to oncoming provider Dr. Wilkie Aye pending CT scan and reassessment.      Additional history obtained: -Additional history obtained from family -External records from outside source obtained and reviewed including: Chart review including previous notes, labs, imaging, consultation notes including oncology visit 5/31   Lab  Tests: -I ordered, reviewed, and interpreted labs.   The pertinent results include:   Labs Reviewed  COMPREHENSIVE METABOLIC PANEL - Abnormal; Notable for the following components:      Result Value   Potassium 3.2 (*)    CO2 20 (*)    Glucose, Bld 135 (*)    Anion gap 16 (*)    All other components within normal limits  URINALYSIS, ROUTINE W REFLEX MICROSCOPIC - Abnormal; Notable for the following components:   Ketones, ur  80 (*)    All other components within normal limits  LIPASE, BLOOD  CBC    Notable for mild low CO2 and hypokalemia  Imaging Studies ordered: I ordered imaging studies including CT abdomen Pending at sign out   Medicines ordered and prescription drug management: Meds ordered this encounter  Medications   ondansetron (ZOFRAN-ODT) disintegrating tablet 4 mg   HYDROmorphone (DILAUDID) injection 1 mg   ondansetron (ZOFRAN) injection 4 mg   sodium chloride 0.9 % bolus 1,000 mL   iohexol (OMNIPAQUE) 300 MG/ML solution 100 mL    -I have reviewed the patients home medicines and have made adjustments as needed   Cardiac Monitoring: The patient was maintained on a cardiac monitor.  I personally viewed and interpreted the cardiac monitored which showed an underlying rhythm of: NSR  Reevaluation: After the interventions noted above, I reevaluated the patient and found that their symptoms have improved  Co morbidities that complicate the patient evaluation  Past Medical History:  Diagnosis Date   Arthritis    hip   GERD (gastroesophageal reflux disease)    Hyperlipidemia    Hypertension    Intractable nausea and vomiting 06/07/2022      Dispostion: Disposition decision including need for hospitalization was considered, and patient disposition pending at time of sign out.    Final Clinical Impression(s) / ED Diagnoses Final diagnoses:  Generalized abdominal pain     This chart was dictated using voice recognition software.  Despite best efforts  to proofread,  errors can occur which can change the documentation meaning.    Lonell Grandchild, MD 11/09/22 [phone]

## 2022-11-09 NOTE — Evaluation (Signed)
Occupational Therapy Evaluation Patient Details Name: Christy Kirby MRN: [id number] DOB: 1951-04-15 Today's Date: 11/09/2022   History of Present Illness Christy Kirby is a 72 yr old female admitted to the hospital 11-08-22 with abdominal pain, nausea, and chills. She was found to have a small bowel obstruction and hypokalemia. 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 During the session, she performed lower body dressing, sit to stand, and ambulation independently. She is not presenting with functional deficits that warrant the need for further OT services. OT will sign off and recommend she return home at discharge.      Recommendations for follow up therapy are one component of a multi-disciplinary discharge planning process, led by the attending physician.  Recommendations may be updated based on patient status, additional functional criteria and insurance authorization.   Assistance Recommended at Discharge None  Patient can return home with the following  (N/A)    Functional Status Assessment  Patient has not had a recent decline in their functional status  Equipment Recommendations  None recommended by OT       Precautions / Restrictions Restrictions Weight Bearing Restrictions: No Other Position/Activity Restrictions: NG tube      Mobility Bed Mobility Overal bed mobility: Independent                  Transfers Overall transfer level: Independent Equipment used: None                      Balance Overall balance assessment: No apparent balance deficits (not formally assessed)             ADL either performed or assessed with clinical judgement   ADL Overall ADL's : Independent;At baseline           Vision Patient Visual Report: No change from baseline Additional Comments: she correctly read the time depicted on  the wall clock            Pertinent Vitals/Pain Pain Assessment Pain Location: she did not report having pain during the session     Hand Dominance Right   Extremity/Trunk Assessment Upper Extremity Assessment Upper Extremity Assessment: Overall WFL for tasks assessed   Lower Extremity Assessment Lower Extremity Assessment: Overall WFL for tasks assessed       Communication Communication Communication: No difficulties   Cognition Arousal/Alertness: Awake/alert Behavior During Therapy: WFL for tasks assessed/performed Overall Cognitive Status: Within Functional Limits for tasks assessed              General Comments: Oriented x4, able to follow commands without difficulty                Home Living Family/patient expects to be discharged to:: Private residence Living Arrangements: Alone Available Help at Discharge: Family Type of Home: House Home Access: Level entry     Home Layout: Two level;Able to live on main level with bedroom/bathroom     Bathroom Shower/Tub: Tub/shower unit         Home Equipment: Agricultural consultant (2 wheels);Cane - single point; Tub seat          Prior Functioning/Environment Prior Level of Function : Independent/Modified Independent             Mobility Comments: Independent with ambulation ADLs Comments: She was independent with ADLs, cooking, and cleaning.  OT Problem List:  (N/A)      OT Treatment/Interventions:   N/A      OT Frequency:  N/A       AM-PAC OT "6 Clicks" Daily Activity     Outcome Measure Help from another person eating meals?: None Help from another person taking care of personal grooming?: None Help from another person toileting, which includes using toliet, bedpan, or urinal?: None Help from another person bathing (including washing, rinsing, drying)?: None Help from another person to put on and taking off regular upper body clothing?: None Help from another person to put on and  taking off regular lower body clothing?: None 6 Click Score: 24   End of Session Equipment Utilized During Treatment: Other (comment) (none) Nurse Communication: Mobility status  Activity Tolerance: Patient tolerated treatment well Patient left: Other (comment) (Standing in hall with evaluating PT present)  OT Visit Diagnosis: Pain                Time: 1610-9604 OT Time Calculation (min): 14 min Charges:  OT General Charges $OT Visit: 1 Visit OT Evaluation $OT Eval Low Complexity: 1 Low    Christy Kirby L Dayrin Stallone, OTR/L 11/09/2022, 2:47 PM

## 2022-11-09 NOTE — ED Provider Notes (Addendum)
Patient signed out pending CT scan.  CT scan is concerning for small bowel obstruction.  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  Surgeon was Dr. Michaell Cowing.  On recheck, patient states that she feels somewhat better with fluids and pain and nausea medication.  Will place an NG tube.  Will consult with general surgery and the hospitalist for admission.  1:08 AM Spoke to Dr. Magnus Ivan.  Agrees with NG tube.  They will assess patient in the morning.  Will plan for admission to the hospitalist.  Physical Exam  BP (!) 171/104 (BP Location: Right Arm)   Pulse 96   Temp 98.3 F (36.8 C) (Oral)   Resp 20   SpO2 100%   Physical Exam Awake, alert, no acute distress. Procedures  Procedures  ED Course / MDM    Medical Decision Making Amount and/or Complexity of Data Reviewed Labs: ordered. Radiology: ordered.  Risk Prescription drug management. Decision regarding hospitalization.   Problem List Items Addressed This Visit   None Visit Diagnoses     Generalized abdominal pain    -  Primary   SBO (small bowel obstruction) (HCC)                 Keoni Risinger, Mayer Masker, MD 11/09/22 [id number]    Shon Baton, MD 11/09/22 [phone]

## 2022-11-10 DIAGNOSIS — K56609 Unspecified intestinal obstruction, unspecified as to partial versus complete obstruction: Secondary | ICD-10-CM | POA: Diagnosis not present

## 2022-11-10 LAB — CBC
HCT: 30.8 % — ABNORMAL LOW (ref 36.0–46.0)
Hemoglobin: 10 g/dL — ABNORMAL LOW (ref 12.0–15.0)
MCH: 29.9 pg (ref 26.0–34.0)
MCHC: 32.5 g/dL (ref 30.0–36.0)
MCV: 92.2 fL (ref 80.0–100.0)
Platelets: 202 10*3/uL (ref 150–400)
RBC: 3.34 MIL/uL — ABNORMAL LOW (ref 3.87–5.11)
RDW: 13.3 % (ref 11.5–15.5)
WBC: 3.7 10*3/uL — ABNORMAL LOW (ref 4.0–10.5)
nRBC: 0 % (ref 0.0–0.2)

## 2022-11-10 LAB — BASIC METABOLIC PANEL
Anion gap: 8 (ref 5–15)
BUN: 7 mg/dL — ABNORMAL LOW (ref 8–23)
CO2: 25 mmol/L (ref 22–32)
Calcium: 8.5 mg/dL — ABNORMAL LOW (ref 8.9–10.3)
Chloride: 102 mmol/L (ref 98–111)
Creatinine, Ser: 0.87 mg/dL (ref 0.44–1.00)
GFR, Estimated: >60 mL/min (ref >60)
Glucose, Bld: 85 mg/dL (ref 70–99)
Potassium: 3.3 mmol/L — ABNORMAL LOW (ref 3.5–5.1)
Sodium: 135 mmol/L (ref 135–145)

## 2022-11-10 LAB — MAGNESIUM: Magnesium: 1.8 mg/dL (ref 1.7–2.4)

## 2022-11-10 MED ORDER — MAGNESIUM SULFATE 2 GM/50ML IV SOLN
2.0000 g | Freq: Once | INTRAVENOUS | Status: AC
  Administered 2022-11-10: 2 g via INTRAVENOUS
  Filled 2022-11-10: qty 50

## 2022-11-10 MED ORDER — POTASSIUM CHLORIDE 2 MEQ/ML IV SOLN
INTRAVENOUS | Status: DC
  Filled 2022-11-10 (×5): qty 1000

## 2022-11-10 NOTE — Progress Notes (Signed)
Christy Kirby [id number] [birth year]  CARE TEAM:  PCP: Christy Salisbury, MD  Outpatient Care Team: Patient Care Team: Christy Salisbury, MD as PCP - Christy Hau, MD as Consulting Physician (General Surgery) Christy Fredrickson, MD as Consulting Physician (Gastroenterology) Christy Mood, MD as Consulting Physician (Oncology)  Inpatient Treatment Team: Treatment Team: Attending Provider: [place], Eric J, DO; Rounding Team: Christy Gilford, MD; Consulting Physician: Christy Soda, MD; Mobility Specialist: Christy Kirby; Technician: Christy Kirby, NT; Licensed Practical Nurse: Christy Orleans, LPN; Pharmacist: Christy Kirby, Christy Kirby; Utilization Review: Christy Numbers, RN   Problem List:   Principal Problem:   SBO (small bowel obstruction) [facility]) Active Problems:   Helicobacter pylori gastritis   Malignant carcinoid tumor of ileum (HCC)      10/07/2022  POST-OPERATIVE DIAGNOSIS:   MASS IN SMALL INTESTINE, 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  Not a classic stellate carcinoid but suspicious versus primary cancer.  No obvious metastatic disease on visceral parietal peritoneum or liver.  Long torturous appendix with adhesions -appendectomy done.   It is an isoperistaltic ileo-ileal anastomosis that rests in the pelvic region.  FINAL MICROSCOPIC DIAGNOSIS:  A. VERIFORM APPENDIX, APPENDECTOMY: - Benign appendix with no specific histopathologic changes  B. ILEUM MASS, RESECTION: - Well-differentiated neuroendocrine tumor (G1; low-grade), 1.5 cm, involving ileum - Tumor  involves the serosal surface - Resection margins are negative for tumor - Metastatic carcinoma to two thank you of three lymph nodes (2/3) - Mesenteric tumor deposit, 2.7 cm - Pathologic Stage Classification (pTNM, AJCC 8th Edition): pT4, pN1   Assessment  Postoperative ileus versus early obstruction most likely due to inflammation and eating large unchewed meal.  [facility] Stay = 1 days)  Plan:  It looks like the distal end of ileo-ileal anastomosis is transition point.  Usually in such situations less than 3 months it is inflammatory and will resolve on its own.  She has no evidence of peritonitis leak or abscess that raises concerns for need for urgent surgery.  Having some flatus with low NG tube output.  Therefore try NG tube clamping trial with clear liquids.  If tolerates that, can remove NG tube later today.  IV fluids.  Check labs.  No evidence of leukocytosis.  Because this happened rather quickly, hopefully will resolve quickly.  We will see.    Pathology consistent with neuroendocrine carcinoid tumor.  Appears well-differentiated.  Seen by the [facility].  Due for the survival pathway.  -GERD with history of H. pylori gastritis treated with Pylera January - February 2024.  Followed by [facility] gastroenterology.  With persistent nausea will keep at least on low-dose PPI  -Hypertension control.  Persistently high.  Restarted metoprolol 5/7.  Will increase dose gradually.  Backup as needed   -VTE prophylaxis- SCDs, etc  -mobilize as tolerated to help recovery -strongly encouraged her to walk is much as possible.  She is feeling more confident in being more up in the chair in the hallways which is reassuring.  Disposition: TBD       I reviewed nursing notes, last 24 h vitals and pain scores,  last 48 h intake and output, last 24 h labs and trends, and last 24 h imaging results. I have reviewed this patient's available data, including medical history,  events of note, test results, etc as part of my evaluation.  A significant portion of that time was spent in counseling.  Care during the described time interval was provided by me.  This care required moderate level of medical decision making.  11/10/2022    Subjective: (Chief complaint)  Feeling better overall.  NG tube somewhat annoying.  Denies any nausea or vomiting.  No more abdominal cramping.  Objective:  Vital signs:  Vitals:   11/09/22 1757 11/09/22 2113 11/10/22 0159 11/10/22 0620  BP: (!) 149/93 (!) 145/85 (!) 145/90 (!) 140/87  Pulse: 89 89 90 87  Resp: 14 18 18 18   Temp: 98.5 F (36.9 C) 98.6 F (37 C) 98.5 F (36.9 C) 98.3 F (36.8 C)  TempSrc: Oral Oral Oral Oral  SpO2: 100% 99% 99% 100%  Weight:      Height:        Last BM Date : 11/09/22  Intake/Output   Yesterday:  06/05 0701 - 06/06 0700 In: 2419.4 [I.V.:2359.4; NG/GT:60] Out: 1750 [Urine:1700; Emesis/NG output:50] This shift:  No intake/output data recorded.  Bowel function:  Flatus: YES  BM:  No  NGT: Bilious   Physical Exam:  General: Pt awake/alert in no acute distress.  Not toxic nor sickly. Eyes: PERRL, normal EOM.  Sclera clear.  No icterus Neuro: CN II-XII intact w/o focal sensory/motor deficits. Lymph: No head/neck/groin lymphadenopathy Psych:  No delerium/psychosis/paranoia.  Oriented x 4.    HENT: Normocephalic, Mucus membranes moist.  No thrush.  NG tube in place with thin bilious output Neck: Supple, No tracheal deviation.  No obvious thyromegaly Chest: No pain to chest wall compression.  Good respiratory excursion.  No audible wheezing CV:  Pulses intact.  Regular rhythm.  No major extremity edema MS: Normal AROM mjr joints.  No obvious deformity  Abdomen: Soft.  Nondistended.  Nontender.  Incisions clean dry and intact.  No evidence of peritonitis.  No incarcerated hernias.  Ext:   No deformity.  No mjr edema.  No cyanosis Skin: No petechiae / purpurea.  No major  sores.  Warm and dry    Results:    SURGICAL PATHOLOGY CASE: Christy Kirby PATIENT: Christy Kirby Surgical Pathology Report     Clinical History: Mass in small intestine, probable carcinoid (crm)     FINAL MICROSCOPIC DIAGNOSIS:  A. VERIFORM APPENDIX, APPENDECTOMY: - Benign appendix with no specific histopathologic changes  B. ILEUM MASS, RESECTION: - Well-differentiated neuroendocrine tumor (G1; low-grade), 1.5 cm, involving ileum - Tumor involves the serosal surface - Resection margins are negative for tumor - Metastatic carcinoma to two thank you of three lymph nodes (2/3) - Mesenteric tumor deposit, 2.7 cm - See oncology table      ONCOLOGY TABLE:  JEJUNUM AND ILEUM NEUROENDOCRINE TUMOR: Resection  Procedure: Resection, ileum Tumor Site: Ileum Tumor Size: 1.5 cm Tumor Focality: Unifocal Histologic Type and Grade: Low-grade (G1)      Mitotic Rate: 1/10 HPF      Ki-67 Labeling Index: Approximately 1% Tumor Extension: Tumor invades into serosal surface Lymphovascular Invasion: Not identified Large Mesenteric Masses (>2 cm): Present Margins: All margins negative for tumor Regional Lymph Nodes:      Number of Lymph Nodes with Tumor: 1      Number of Lymph Nodes Examined: 2 Distant metastasis:      Distant  Site(s) Involved: Not applicable Pathologic Stage Classification (pTNM, AJCC 8th Edition): pT4, pN1 Ancillary Studies: Can be performed upon request Representative Tumor Block: B4 Comment(s): None (v1.1.0.0)      Breyden Jeudy DESCRIPTION:  Specimen A: Received fresh is a 9.7 cm in length and averaging 0.6 cm in diameter appendix which has a tan-pink smooth serosa and attached soft fatty tissue.  Cut surfaces are unremarkable.  Representative sections are submitted in 1 block.  Specimen B: Received fresh is a 23.5 cm unoriented length of intestine grossly consistent with small bowel and clinically identified as ileum. The serosa has an area of pale  yellow indurated nodularity with umbilication 8.5 cm from 1 margin.  There is also attached mesentery which is focally involved with firm nodular tissue near the area of nodular umbilicated serosa.  On opening, there is a 1.5 cm in diameter and up to 0.8 cm thick yellow-white firm ill-defined intramural mass which has overlying pink-red smooth mucosa.  The mass is 8 and 14 cm from the margins of resection.  Remaining specimen has tan-pink to pink-green smooth soft mucosa with normal intestinal folds, and a wall averaging 0.4 cm thick.  The nodular portion of attached mesentery is 2.7 x 2.4 x 1.5 cm and has a pale yellow-white firm ill-defined cut surface.  This firm ill-defined tissue is present at the mesenteric margin (margin inked black).  No lymphoid tissue is identified. Block summary: Block 1 = margin of resection nearest intramural mass Block 2 = margin of resection furthest from intramural mass Blocks 3-6 = intramural mass, entirely submitted Blocks 7, 8 = sections of nodular tissue involving mesentery, including inked mesenteric margin  SW 10/10/2022   Final Diagnosis performed by Holley Bouche, MD.   Electronically signed 10/13/2022 Technical and / or Professional components performed at [facility], [address] [place], [place] [address].  Immunohistochemistry Technical component (if applicable) was performed at [facility]. [address], [place], [place] [address].   IMMUNOHISTOCHEMISTRY DISCLAIMER (if applicable): Some of these immunohistochemical stains may have been developed and the performance characteristics determine by [facility]. Some may not have been cleared or approved by the U.S. Food and Drug Administration. The FDA has determined that such clearance or approval is not necessary. This test is used for clinical purposes. It should not be regarded as investigational or for research. This  laboratory is certified under the Clinical Laboratory Improvement Amendments of 1988 (CLIA-88) as qualified to perform high complexity clinical laboratory testing.  The controls stained appropriately.    Cultures: No results found for this or any previous visit (from the past 720 hour(s)).  Labs: Results for orders placed or performed during the hospital encounter of 11/08/22 (from the past 48 hour(s))  Lipase, blood     Status: None   Collection Time: 11/08/22  8:59 PM  Result Value Ref Range   Lipase 26 11 - 51 U/L    Comment: Performed at [facility], [address] [place], [place] [address]  Comprehensive metabolic panel     Status: Abnormal   Collection Time: 11/08/22  8:59 PM  Result Value Ref Range   Sodium 139 135 - 145 mmol/L   Potassium 3.2 (L) 3.5 - 5.1 mmol/L   Chloride 103 98 - 111 mmol/L   CO2 20 (L) 22 - 32 mmol/L   Glucose, Bld 135 (H) 70 - 99 mg/dL    Comment: Glucose reference range applies only to samples taken  after fasting for at least 8 hours.   BUN 13 8 - 23 mg/dL   Creatinine, Ser 1.61 0.44 - 1.00 mg/dL   Calcium 9.6 8.9 - 09.6 mg/dL   Total Protein 7.9 6.5 - 8.1 g/dL   Albumin 4.0 3.5 - 5.0 g/dL   AST 18 15 - 41 U/L   ALT 10 0 - 44 U/L   Alkaline Phosphatase 42 38 - 126 U/L   Total Bilirubin 1.0 0.3 - 1.2 mg/dL   GFR, Estimated >04 >54 mL/min    Comment: (NOTE) Calculated using the CKD-EPI Creatinine Equation (2021)    Anion gap 16 (H) 5 - 15    Comment: Performed at [facility], [address] [place], [place] [address]  CBC     Status: None   Collection Time: 11/08/22  8:59 PM  Result Value Ref Range   WBC 6.8 4.0 - 10.5 K/uL   RBC 4.21 3.87 - 5.11 MIL/uL   Hemoglobin 12.7 12.0 - 15.0 g/dL   HCT 91.4 78.2 - 95.6 %   MCV 89.3 80.0 - 100.0 fL   MCH 30.2 26.0 - 34.0 pg   MCHC 33.8 30.0 - 36.0 g/dL   RDW 21.3 08.6 - 57.8 %   Platelets 267 150 - 400 K/uL   nRBC 0.0 0.0 - 0.2 %    Comment: Performed  at [facility], [address] [place], [place] [address]  Urinalysis, Routine w reflex microscopic -Urine, Clean Catch     Status: Abnormal   Collection Time: 11/08/22  9:30 PM  Result Value Ref Range   Color, Urine YELLOW YELLOW   APPearance CLEAR CLEAR   Specific Gravity, Urine 1.016 1.005 - 1.030   pH 7.0 5.0 - 8.0   Glucose, UA NEGATIVE NEGATIVE mg/dL   Hgb urine dipstick NEGATIVE NEGATIVE   Bilirubin Urine NEGATIVE NEGATIVE   Ketones, ur 80 (A) NEGATIVE mg/dL   Protein, ur NEGATIVE NEGATIVE mg/dL   Nitrite NEGATIVE NEGATIVE   Leukocytes,Ua NEGATIVE NEGATIVE    Comment: Performed at [facility], [address], [place], [place] [address]  CBC     Status: Abnormal   Collection Time: 11/09/22  4:39 AM  Result Value Ref Range   WBC 4.7 4.0 - 10.5 K/uL   RBC 3.72 (L) 3.87 - 5.11 MIL/uL   Hemoglobin 11.2 (L) 12.0 - 15.0 g/dL   HCT 13.2 (L) 44.0 - 10.2 %   MCV 92.2 80.0 - 100.0 fL   MCH 30.1 26.0 - 34.0 pg   MCHC 32.7 30.0 - 36.0 g/dL   RDW 72.5 36.6 - 44.0 %   Platelets 225 150 - 400 K/uL   nRBC 0.0 0.0 - 0.2 %    Comment: Performed at [facility], [address], [place], [place] [address]  Basic metabolic panel     Status: Abnormal   Collection Time: 11/09/22  4:39 AM  Result Value Ref Range   Sodium 141 135 - 145 mmol/L   Potassium 3.7 3.5 - 5.1 mmol/L   Chloride 107 98 - 111 mmol/L   CO2 26 22 - 32 mmol/L   Glucose, Bld 121 (H) 70 - 99 mg/dL    Comment: Glucose reference range applies only to samples taken after fasting for at least 8 hours.   BUN 11 8 - 23 mg/dL   Creatinine, Ser 5.95 0.44 - 1.00 mg/dL   Calcium 8.8 (L) 8.9 - 10.3 mg/dL   GFR, Estimated >  60 >60 mL/min    Comment: (NOTE) Calculated using the CKD-EPI Creatinine Equation (2021)    Anion gap 8 5 - 15    Comment: Performed at [facility], [address] [place], [place] [address]  Magnesium     Status: None   Collection  Time: 11/09/22  4:39 AM  Result Value Ref Range   Magnesium 2.0 1.7 - 2.4 mg/dL    Comment: Performed at [facility], [address] [place], [place] [address]  Phosphorus     Status: None   Collection Time: 11/09/22  4:39 AM  Result Value Ref Range   Phosphorus 4.1 2.5 - 4.6 mg/dL    Comment: Performed at [facility], [address], [place], [place] [address]  Prealbumin     Status: None   Collection Time: 11/09/22  8:23 AM  Result Value Ref Range   Prealbumin 26 18 - 38 mg/dL    Comment: Performed at [facility], [address], [place], [place] [address]  CBC     Status: Abnormal   Collection Time: 11/10/22  4:52 AM  Result Value Ref Range   WBC 3.7 (L) 4.0 - 10.5 K/uL   RBC 3.34 (L) 3.87 - 5.11 MIL/uL   Hemoglobin 10.0 (L) 12.0 - 15.0 g/dL   HCT 29.5 (L) 62.1 - 30.8 %   MCV 92.2 80.0 - 100.0 fL   MCH 29.9 26.0 - 34.0 pg   MCHC 32.5 30.0 - 36.0 g/dL   RDW 65.7 84.6 - 96.2 %   Platelets 202 150 - 400 K/uL   nRBC 0.0 0.0 - 0.2 %    Comment: Performed at [facility], [address], [place], [place] [address]  Basic metabolic panel     Status: Abnormal   Collection Time: 11/10/22  4:52 AM  Result Value Ref Range   Sodium 135 135 - 145 mmol/L   Potassium 3.3 (L) 3.5 - 5.1 mmol/L   Chloride 102 98 - 111 mmol/L   CO2 25 22 - 32 mmol/L   Glucose, Bld 85 70 - 99 mg/dL    Comment: Glucose reference range applies only to samples taken after fasting for at least 8 hours.   BUN 7 (L) 8 - 23 mg/dL   Creatinine, Ser 1.32 0.44 - 1.00 mg/dL   Calcium 8.5 (L) 8.9 - 10.3 mg/dL   GFR, Estimated >44 >01 mL/min    Comment: (NOTE) Calculated using the CKD-EPI Creatinine Equation (2021)    Anion gap 8 5 - 15    Comment: Performed at [facility], [address] [place], [place] [address]  Magnesium     Status: None   Collection Time: 11/10/22  4:52 AM  Result Value Ref Range   Magnesium 1.8 1.7 - 2.4  mg/dL    Comment: Performed at [facility], [address], [place], [place] [address]     Imaging / Studies: DG Abd Portable 1V  Result Date: 11/09/2022 CLINICAL DATA:  Small bowel obstruction EXAM: PORTABLE ABDOMEN - 1 VIEW; PORTABLE CHEST - 1 VIEW COMPARISON:  CT scan 06/05/2022 FINDINGS: Nasogastric tube noted with tip in the stomach fundus. The lungs appear clear. No blunting of the costophrenic angles. Heart size within normal limits. No significant bony abnormality observed. IMPRESSION: 1. Nasogastric tube tip is in the stomach fundus. 2. The lungs appear clear. Electronically Signed   By: Gaylyn Rong M.D.   On: 11/09/2022  11:02   DG CHEST PORT 1 VIEW  Result Date: 11/09/2022 CLINICAL DATA:  Small bowel obstruction EXAM: PORTABLE ABDOMEN - 1 VIEW; PORTABLE CHEST - 1 VIEW COMPARISON:  CT scan 06/05/2022 FINDINGS: Nasogastric tube noted with tip in the stomach fundus. The lungs appear clear. No blunting of the costophrenic angles. Heart size within normal limits. No significant bony abnormality observed. IMPRESSION: 1. Nasogastric tube tip is in the stomach fundus. 2. The lungs appear clear. Electronically Signed   By: Gaylyn Rong M.D.   On: 11/09/2022 11:02   DG Abdomen 1 View  Result Date: 11/09/2022 CLINICAL DATA:  Repositioning of nasogastric tube. EXAM: ABDOMEN - 1 VIEW COMPARISON:  November 09, 2022 FINDINGS: A nasogastric tube is seen with its distal tip overlying the expected region of the gastric fundus. The distal side hole is seen within the expected region of the body of the stomach. The bowel gas pattern is normal. Radiopaque contrast is seen within the bilateral renal collecting systems. IMPRESSION: Nasogastric tube positioning, as described above. Electronically Signed   By: Aram Candela M.D.   On: 11/09/2022 03:21   DG Abdomen 1 View  Result Date: 11/09/2022 CLINICAL DATA:  NG tube placement. EXAM: ABDOMEN - 1 VIEW COMPARISON:  None Available.  FINDINGS: Mildly dilated loops of small bowel in the right lower quadrant measuring up to 3.1 cm. An enteric tube loops in the stomach and courses superiorly into the esophagus. Excreted contrast is noted in the renal collecting systems bilaterally and urinary bladder. Total hip arthroplasty changes are present on the right. IMPRESSION: 1. Mildly distended loops of small bowel in the abdomen, possible ileus versus partial or early small bowel obstruction. 2. Enteric tube loops in the stomach and courses distally in the visualized esophagus. Repositioning is recommended. Electronically Signed   By: Thornell Sartorius M.D.   On: 11/09/2022 03:17   CT ABDOMEN PELVIS W CONTRAST  Result Date: 11/09/2022 CLINICAL DATA:  Acute nonlocalized abdominal pain. Upper abdominal pain, nausea, and vomiting. History of small-bowel carcinoma. Recent ileal mass removed on 10/07/2022. EXAM: CT ABDOMEN AND PELVIS WITH CONTRAST TECHNIQUE: Multidetector CT imaging of the abdomen and pelvis was performed using the standard protocol following bolus administration of intravenous contrast. RADIATION DOSE REDUCTION: This exam was performed according to the departmental dose-optimization program which includes automated exposure control, adjustment of the mA and/or kV according to patient size and/or use of iterative reconstruction technique. CONTRAST:  75mL OMNIPAQUE IOHEXOL 300 MG/ML  SOLN COMPARISON:  07/14/2022 and 06/07/2022 FINDINGS: Lower chest: Mild atelectasis in the lung bases. Hepatobiliary: No focal liver lesions. Cholelithiasis with several stones in the gallbladder. No wall thickening or inflammatory stranding. No bile duct dilatation. Pancreas: Unremarkable. No pancreatic ductal dilatation or surrounding inflammatory changes. Spleen: Normal in size without focal abnormality. Adrenals/Urinary Tract: Adrenal glands are unremarkable. Kidneys are normal, without renal calculi, focal lesion, or hydronephrosis. Bladder is unremarkable.  Stomach/Bowel: Stomach is decompressed. Fluid-filled distended small bowel with distal decompression. Dilatation and fecalization at a right lower quadrant anastomosis. Changes likely to represent small bowel obstruction. No wall thickening or pneumatosis. Mesenteric fluid is likely reactive. Mesenteric scarring and tethering in the right lower quadrant. Colon is mostly decompressed. Vascular/Lymphatic: No significant vascular findings are present. No enlarged abdominal or pelvic lymph nodes. Reproductive: Nodular appearance of the uterus consistent with fibroids. No abnormal adnexal masses. Other: Small amount of free fluid in the abdomen is likely reactive. No free air. Abdominal wall musculature appears intact. Musculoskeletal: Postoperative right hip arthroplasty.  Degenerative changes in the spine. IMPRESSION: Postoperative changes in the right lower quadrant with fecalization of contents in the level of the anastomosis. Proximal dilated fluid-filled small bowel suggesting small bowel obstruction. Small amount of free fluid in the abdomen and pelvis is likely reactive. Tethering of right lower quadrant mesentery may indicate adhesions. Cholelithiasis without evidence of acute cholecystitis. Uterine fibroids. Electronically Signed   By: Burman Nieves M.D.   On: 11/09/2022 00:37    Medications / Allergies: per chart  Antibiotics: Anti-infectives (From admission, onward)    None         Note: Portions of this report may have been transcribed using voice recognition software. Every effort was made to ensure accuracy; however, inadvertent computerized transcription errors may be present.   Any transcriptional errors that result from this process are unintentional.    Christy Sportsman, MD, FACS, MASCRS Esophageal, Gastrointestinal & Colorectal Surgery Robotic and Minimally Invasive Surgery  [facility] [address], [address] [place],  [place] [address] [phone] Fax [phone] Main  CONTACT INFORMATION:  Weekday (9AM-5PM): Call [facility] main office at [phone]  Weeknight (5PM-9AM) or Weekend/Holiday: Check [email or web address] (password " [email or web address]") for General Surgery [facility] coverage  (Please, do not use SecureChat as it is not reliable communication to reach operating surgeons for immediate patient care given surgeries/outpatient duties/clinic/cross-coverage/off post-call which would lead to a delay in care.  Epic staff messaging available for outptient concerns, but may not be answered for 48 hours or more).     11/10/2022  9:11 AM

## 2022-11-10 NOTE — Progress Notes (Signed)
PROGRESS NOTE    Christy Kirby  ZOX:[id number] DOB: May 09, 1951 DOA: 11/08/2022 PCP: Nelwyn Salisbury, MD    Brief Narrative:   Christy Kirby is a 72 y.o. 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 Michaell Cowing), HTN no longer on antihypertensive therapy, history of H. pylori gastritis, GERD, osteoarthritis who presented to [facility] ED with abdominal pain.  Associated with nausea/vomiting.  Onset the day of arrival.  In the ED, temperature 98.3 F, HR 96, RR 20, BP 171/104, SpO2 100% on room air.  WBC 6.8, hemoglobin 12.7, platelets 267.  Sodium 139, potassium 3.2, chloride 103, CO2 20, glucose 135, BUN 13, creatinine 0.98.  Urinalysis unrevealing. CT abdomen/pelvis with contrast with postoperative changes right lower quadrant with fecalization of contents in the level of the anastomosis, proximal dilated fluid-filled small bowels suggesting small bowel obstruction, small amount of free fluid in the abdomen and pelvis likely reactive, tethering of right lower quadrant mesentery may indicate adhesions.  General surgery was consulted.  TR[facility] consulted for admission for further evaluation management of small bowel obstruction.  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  Recently underwent resection of her small bowel/ileum due to neuroendocrine tumor by Dr. Michaell Cowing on 10/07/2022.  CT Abdo/pelvis notable for proximal dilated fluid-filled small bowel suggesting obstruction and tethering of right lower quadrant mesentery concerning for adhesions. -- General surgery following, appreciate assistance -- NG tube clamping trial today with trial of clear liquids -- LR w/ 40 KCl at 75 mL/h --  Antiemetics as needed -- Further per general surgery  Hypokalemia Potassium 3.3 magnesium 1.8.  Added potassium to IV fluids today -- Repeat electrolytes in a.m.  Hx essential hypertension Currently not on antihypertensive medications outpatient. -- Hydralazine 5 mg IV every 6 hours as needed SBP >150  GERD -- Protonix 40 mg IV every 12 hours while NPO   DVT prophylaxis: enoxaparin (LOVENOX) injection 40 mg Start: 11/09/22 1000    Code Status: Full Code Family Communication: No family present at bedside this morning  Disposition Plan:  Level of care: Med-Surg Status is: Inpatient Remains inpatient appropriate because: NG tube in place with clamping trial and initiation of clear liquids, will need further advancement of diet, general surgery signed off before stable for discharge home    Consultants:  General surgery  Procedures:  None  Antimicrobials:  None  Subjective: Seen examined bedside, resting comfortably.  Lying in bed.  NG tube in place; now clamped with trial of clear liquids per general surgery today.  Reports flatus.  No bowel movements. No other questions or concerns at this time.  Denies headache, no dizziness, no chest pain, no palpitations, no shortness of breath, no current abdominal pain, no fever/chills/night sweats, no current nausea/vomiting, no diarrhea, no focal weakness, no fatigue, no paresthesias.  No acute events overnight per nursing staff.  Objective: Vitals:   11/09/22 2113 11/10/22 0159 11/10/22 0620 11/10/22 0913  BP: (!) 145/85 (!) 145/90 (!) 140/87 139/79  Pulse: 89 90 87 100  Resp: 18 18 18 16   Temp: 98.6 F (37 C) 98.5 F (36.9 C) 98.3 F (36.8 C) 98.5 F (36.9 C)  TempSrc: Oral Oral Oral Oral  SpO2: 99% 99% 100% 99%  Weight:      Height:  Intake/Output Summary (Last 24 hours) at 11/10/2022 1109 Last data filed at 11/10/2022 0914 Gross per 24 hour  Intake 2217.75 ml  Output 1950 ml  Net 267.75 ml   Filed Weights    11/09/22 0423  Weight: 61.2 kg    Examination:  Physical Exam: GEN: NAD, alert and oriented x 3, wd/wn HEENT: NCAT, PERRL, EOMI, sclera clear, MMM PULM: CTAB w/o wheezes/crackles, normal respiratory effort CV: RRR w/o M/G/R GI: abd soft, NTND, faint bowel sounds MSK: no peripheral edema, moves all extremity independently NEURO: No focal neurological deficits appreciated PSYCH: normal mood/affect Integumentary: No concerning rash/lesions/wounds noted on exposed skin surfaces    Data Reviewed: I have personally reviewed following labs and imaging studies  CBC: Recent Labs  Lab 11/04/22 1221 11/08/22 2059 11/09/22 0439 11/10/22 0452  WBC 4.3 6.8 4.7 3.7*  NEUTROABS 2.7  --   --   --   HGB 11.2* 12.7 11.2* 10.0*  HCT 33.4* 37.6 34.3* 30.8*  MCV 91.3 89.3 92.2 92.2  PLT 249 267 225 202   Basic Metabolic Panel: Recent Labs  Lab 11/04/22 1221 11/08/22 2059 11/09/22 0439 11/10/22 0452  NA 140 139 141 135  K 4.1 3.2* 3.7 3.3*  CL 105 103 107 102  CO2 29 20* 26 25  GLUCOSE 100* 135* 121* 85  BUN 16 13 11  7*  CREATININE 0.86 0.98 0.90 0.87  CALCIUM 9.4 9.6 8.8* 8.5*  MG  --   --  2.0 1.8  PHOS  --   --  4.1  --    GFR: Estimated Creatinine Clearance: 50.1 mL/min (by C-G formula based on SCr of 0.87 mg/dL). Liver Function Tests: Recent Labs  Lab 11/04/22 1221 11/08/22 2059  AST 12* 18  ALT 6 10  ALKPHOS 42 42  BILITOT 0.4 1.0  PROT 7.3 7.9  ALBUMIN 3.8 4.0   Recent Labs  Lab 11/08/22 2059  LIPASE 26   No results for input(s): "AMMONIA" in the last 168 hours. Coagulation Profile: No results for input(s): "INR", "PROTIME" in the last 168 hours. Cardiac Enzymes: No results for input(s): "CKTOTAL", "CKMB", "CKMBINDEX", "TROPONINI" in the last 168 hours. BNP (last 3 results) No results for input(s): "PROBNP" in the last 8760 hours. HbA1C: No results for input(s): "HGBA1C" in the last 72 hours. CBG: No results for input(s): "GLUCAP" in the last 168  hours. Lipid Profile: No results for input(s): "CHOL", "HDL", "LDLCALC", "TRIG", "CHOLHDL", "LDLDIRECT" in the last 72 hours. Thyroid Function Tests: No results for input(s): "TSH", "T4TOTAL", "FREET4", "T3FREE", "THYROIDAB" in the last 72 hours. Anemia Panel: No results for input(s): "VITAMINB12", "FOLATE", "FERRITIN", "TIBC", "IRON", "RETICCTPCT" in the last 72 hours. Sepsis Labs: No results for input(s): "PROCALCITON", "LATICACIDVEN" in the last 168 hours.  No results found for this or any previous visit (from the past 240 hour(s)).       Radiology Studies: DG Abd Portable 1V  Result Date: 11/09/2022 CLINICAL DATA:  Small bowel obstruction EXAM: PORTABLE ABDOMEN - 1 VIEW; PORTABLE CHEST - 1 VIEW COMPARISON:  CT scan 06/05/2022 FINDINGS: Nasogastric tube noted with tip in the stomach fundus. The lungs appear clear. No blunting of the costophrenic angles. Heart size within normal limits. No significant bony abnormality observed. IMPRESSION: 1. Nasogastric tube tip is in the stomach fundus. 2. The lungs appear clear. Electronically Signed   By: Gaylyn Rong M.D.   On: 11/09/2022 11:02   DG CHEST PORT 1 VIEW  Result Date: 11/09/2022 CLINICAL DATA:  Small bowel obstruction EXAM:  PORTABLE ABDOMEN - 1 VIEW; PORTABLE CHEST - 1 VIEW COMPARISON:  CT scan 06/05/2022 FINDINGS: Nasogastric tube noted with tip in the stomach fundus. The lungs appear clear. No blunting of the costophrenic angles. Heart size within normal limits. No significant bony abnormality observed. IMPRESSION: 1. Nasogastric tube tip is in the stomach fundus. 2. The lungs appear clear. Electronically Signed   By: Gaylyn Rong M.D.   On: 11/09/2022 11:02   DG Abdomen 1 View  Result Date: 11/09/2022 CLINICAL DATA:  Repositioning of nasogastric tube. EXAM: ABDOMEN - 1 VIEW COMPARISON:  November 09, 2022 FINDINGS: A nasogastric tube is seen with its distal tip overlying the expected region of the gastric fundus. The distal side  hole is seen within the expected region of the body of the stomach. The bowel gas pattern is normal. Radiopaque contrast is seen within the bilateral renal collecting systems. IMPRESSION: Nasogastric tube positioning, as described above. Electronically Signed   By: Aram Candela M.D.   On: 11/09/2022 03:21   DG Abdomen 1 View  Result Date: 11/09/2022 CLINICAL DATA:  NG tube placement. EXAM: ABDOMEN - 1 VIEW COMPARISON:  None Available. FINDINGS: Mildly dilated loops of small bowel in the right lower quadrant measuring up to 3.1 cm. An enteric tube loops in the stomach and courses superiorly into the esophagus. Excreted contrast is noted in the renal collecting systems bilaterally and urinary bladder. Total hip arthroplasty changes are present on the right. IMPRESSION: 1. Mildly distended loops of small bowel in the abdomen, possible ileus versus partial or early small bowel obstruction. 2. Enteric tube loops in the stomach and courses distally in the visualized esophagus. Repositioning is recommended. Electronically Signed   By: Thornell Sartorius M.D.   On: 11/09/2022 03:17   CT ABDOMEN PELVIS W CONTRAST  Result Date: 11/09/2022 CLINICAL DATA:  Acute nonlocalized abdominal pain. Upper abdominal pain, nausea, and vomiting. History of small-bowel carcinoma. Recent ileal mass removed on 10/07/2022. EXAM: CT ABDOMEN AND PELVIS WITH CONTRAST TECHNIQUE: Multidetector CT imaging of the abdomen and pelvis was performed using the standard protocol following bolus administration of intravenous contrast. RADIATION DOSE REDUCTION: This exam was performed according to the departmental dose-optimization program which includes automated exposure control, adjustment of the mA and/or kV according to patient size and/or use of iterative reconstruction technique. CONTRAST:  75mL OMNIPAQUE IOHEXOL 300 MG/ML  SOLN COMPARISON:  07/14/2022 and 06/07/2022 FINDINGS: Lower chest: Mild atelectasis in the lung bases. Hepatobiliary: No  focal liver lesions. Cholelithiasis with several stones in the gallbladder. No wall thickening or inflammatory stranding. No bile duct dilatation. Pancreas: Unremarkable. No pancreatic ductal dilatation or surrounding inflammatory changes. Spleen: Normal in size without focal abnormality. Adrenals/Urinary Tract: Adrenal glands are unremarkable. Kidneys are normal, without renal calculi, focal lesion, or hydronephrosis. Bladder is unremarkable. Stomach/Bowel: Stomach is decompressed. Fluid-filled distended small bowel with distal decompression. Dilatation and fecalization at a right lower quadrant anastomosis. Changes likely to represent small bowel obstruction. No wall thickening or pneumatosis. Mesenteric fluid is likely reactive. Mesenteric scarring and tethering in the right lower quadrant. Colon is mostly decompressed. Vascular/Lymphatic: No significant vascular findings are present. No enlarged abdominal or pelvic lymph nodes. Reproductive: Nodular appearance of the uterus consistent with fibroids. No abnormal adnexal masses. Other: Small amount of free fluid in the abdomen is likely reactive. No free air. Abdominal wall musculature appears intact. Musculoskeletal: Postoperative right hip arthroplasty. Degenerative changes in the spine. IMPRESSION: Postoperative changes in the right lower quadrant with fecalization of contents in the  level of the anastomosis. Proximal dilated fluid-filled small bowel suggesting small bowel obstruction. Small amount of free fluid in the abdomen and pelvis is likely reactive. Tethering of right lower quadrant mesentery may indicate adhesions. Cholelithiasis without evidence of acute cholecystitis. Uterine fibroids. Electronically Signed   By: Burman Nieves M.D.   On: 11/09/2022 00:37        Scheduled Meds:  bisacodyl  10 mg Rectal Daily   enoxaparin (LOVENOX) injection  40 mg Subcutaneous Q24H   lip balm   Topical BID   pantoprazole (PROTONIX) IV  40 mg Intravenous  Q12H   simethicone  80 mg Oral QID   Continuous Infusions:  lactated ringers 1,000 mL with potassium chloride 40 mEq infusion 75 mL/hr at 11/10/22 1610   lactated ringers       LOS: 1 day    Time spent: 51 minutes spent on chart review, discussion with nursing staff, consultants, updating family and interview/physical exam; more than 50% of that time was spent in counseling and/or coordination of care.    Alvira Philips [place], DO [facility] Available via Epic secure chat 7am-7pm After these hours, please refer to coverage provider listed on [email or web address] 11/10/2022, 11:09 AM

## 2022-11-11 DIAGNOSIS — K56609 Unspecified intestinal obstruction, unspecified as to partial versus complete obstruction: Secondary | ICD-10-CM | POA: Diagnosis not present

## 2022-11-11 LAB — BASIC METABOLIC PANEL
Anion gap: 4 — ABNORMAL LOW (ref 5–15)
BUN: 6 mg/dL — ABNORMAL LOW (ref 8–23)
CO2: 28 mmol/L (ref 22–32)
Calcium: 8.9 mg/dL (ref 8.9–10.3)
Chloride: 106 mmol/L (ref 98–111)
Creatinine, Ser: 1 mg/dL (ref 0.44–1.00)
GFR, Estimated: 59 mL/min — ABNORMAL LOW (ref >60)
Glucose, Bld: 97 mg/dL (ref 70–99)
Potassium: 4.4 mmol/L (ref 3.5–5.1)
Sodium: 138 mmol/L (ref 135–145)

## 2022-11-11 LAB — CHROMOGRANIN A: Chromogranin A (ng/mL): 52.5 ng/mL (ref 0.0–101.8)

## 2022-11-11 LAB — MAGNESIUM: Magnesium: 1.9 mg/dL (ref 1.7–2.4)

## 2022-11-11 MED ORDER — POLYETHYLENE GLYCOL 3350 17 G PO PACK: 17.0000 g | PACK | Freq: Two times a day (BID) | ORAL | Status: DC | PRN

## 2022-11-11 MED ORDER — PANTOPRAZOLE SODIUM 40 MG PO TBEC
40.0000 mg | DELAYED_RELEASE_TABLET | Freq: Two times a day (BID) | ORAL | Status: DC
  Administered 2022-11-11 – 2022-11-12 (×3): 40 mg via ORAL
  Filled 2022-11-11 (×3): qty 1

## 2022-11-11 MED ORDER — HYDROMORPHONE HCL 1 MG/ML IJ SOLN: 0.5000 mg | INTRAMUSCULAR | Status: DC | PRN

## 2022-11-11 MED ORDER — POLYETHYLENE GLYCOL 3350 17 G PO PACK
17.0000 g | PACK | Freq: Every day | ORAL | Status: DC
  Administered 2022-11-11 – 2022-11-12 (×2): 17 g via ORAL
  Filled 2022-11-11 (×2): qty 1

## 2022-11-11 MED ORDER — BISACODYL 10 MG RE SUPP: 10.0000 mg | Freq: Two times a day (BID) | RECTAL | Status: DC | PRN

## 2022-11-11 MED ORDER — HYDROCORTISONE 1 % EX CREA
1.0000 | TOPICAL_CREAM | Freq: Three times a day (TID) | CUTANEOUS | Status: DC | PRN
  Administered 2022-11-11: 1 via TOPICAL
  Filled 2022-11-11: qty 28

## 2022-11-11 NOTE — Progress Notes (Signed)
Christy Kirby [id number] [birth year]  CARE TEAM:  PCP: Christy Salisbury, MD  Outpatient Care Team: Patient Care Team: Christy Salisbury, MD as PCP - Erline Hau, MD as Consulting Physician (General Surgery) Hilarie Fredrickson, MD as Consulting Physician (Gastroenterology) Malachy Mood, MD as Consulting Physician (Oncology)  Inpatient Treatment Team: Treatment Team: Attending Provider: [place], Eric J, DO; Rounding Team: Lilyan Gilford, MD; Consulting Physician: Karie Soda, MD; Registered Nurse: Orvil Feil, RN; Pharmacist: Josefa Half, [facility]   Problem List:   Principal Problem:   SBO (small bowel obstruction) [facility]) Active Problems:   Helicobacter pylori gastritis   Malignant carcinoid tumor of ileum (HCC)      10/07/2022  POST-OPERATIVE DIAGNOSIS:   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  Not a classic stellate carcinoid but suspicious versus primary cancer.  No obvious metastatic disease on visceral parietal peritoneum or liver.  Long torturous appendix with adhesions -appendectomy done.   It is an isoperistaltic ileo-ileal anastomosis that rests in the pelvic region.  FINAL MICROSCOPIC DIAGNOSIS:  A. VERIFORM APPENDIX, APPENDECTOMY: - Benign appendix with no specific histopathologic changes  B. ILEUM MASS, RESECTION: - Well-differentiated neuroendocrine tumor (G1; low-grade), 1.5 cm, involving ileum - Tumor involves the serosal surface - Resection margins are negative for tumor - Metastatic carcinoma to two thank you of three  lymph nodes (2/3) - Mesenteric tumor deposit, 2.7 cm - Pathologic Stage Classification (pTNM, AJCC 8th Edition): pT4, pN1   Assessment  Postoperative ileus versus early obstruction most likely due to inflammation and eating large unchewed meal.  [facility] Stay = 2 days)  Plan:  It looks like the distal end of ileo-ileal anastomosis is transition point.  Usually in such situations less than 3 months it is inflammatory and will resolve on its own.  She has no evidence of peritonitis leak or abscess that raises concerns for need for urgent surgery.  Tolerated NG tube clamping trial and clear liquids last night.  Advance to full's.  Then soft as tolerated.  IV fluids.  Think okay to wean off.  Check labs.  No evidence of leukocytosis.  Because this happened rather quickly, hopefully will resolve quickly.  We will see.    Pathology consistent with neuroendocrine carcinoid tumor.  Appears well-differentiated.  Seen by the [facility].  Due for the survival pathway.  -GERD with history of H. pylori gastritis treated with Pylera January - February 2024.  Followed by [facility] gastroenterology.  With persistent nausea will keep at least on low-dose PPI.  Addition from IV to p.o. and see how she tolerates that.  -Hypertension control.  Persistently high.  Restarted metoprolol 5/7.  Will increase dose gradually.  Backup as needed   -VTE prophylaxis- SCDs, etc  -mobilize as tolerated to help recovery -strongly encouraged her to walk is much as possible.  She is feeling more confident in being more up in the chair in the hallways which is reassuring.  Disposition: D/C patient from hospital when patient meets criteria (anticipate in 1-2 day(s)):  Tolerating oral intake well Ambulating well Adequate pain control without IV medications Urinating  Having flatus Disposition planning in place        I reviewed nursing notes, last 24 h vitals and pain scores, last 48 h  intake and output, last 24 h labs and trends, and last 24 h imaging results. I have reviewed this patient's available data, including medical history, events of note, test results, etc as part of my evaluation.  A significant portion of that time was spent in counseling.  Care during the described time interval was provided by me.  This care required moderate level of medical decision making.  11/11/2022    Subjective: (Chief complaint)  Feeling better overall.  NG tube somewhat annoying.  Denies any nausea or vomiting.  No more abdominal cramping.  Objective:  Vital signs:  Vitals:   11/10/22 1259 11/10/22 1622 11/10/22 2033 11/11/22 0543  BP: (!) 143/82 (!) 149/87 (!) 142/94 138/83  Pulse: 99 89 97 84  Resp: 17 16 18 18   Temp: 98.5 F (36.9 C) 98.6 F (37 C) 98.9 F (37.2 C) 98.3 F (36.8 C)  TempSrc: Oral Oral Oral Oral  SpO2: 99% 100% 100% 100%  Weight:      Height:        Last BM Date : 11/09/22  Intake/Output   Yesterday:  06/06 0701 - 06/07 0700 In: 1604.6 [P.O.:840; I.V.:714.6; IV Piggyback:50] Out: 2100 [Urine:2100] This shift:  No intake/output data recorded.  Bowel function:  Flatus: YES  BM:  No  NGT: Bilious   Physical Exam:  General: Pt awake/alert in no acute distress.  Not toxic nor sickly. Eyes: PERRL, normal EOM.  Sclera clear.  No icterus Neuro: CN II-XII intact w/o focal sensory/motor deficits. Lymph: No head/neck/groin lymphadenopathy Psych:  No delerium/psychosis/paranoia.  Oriented x 4.    HENT: Normocephalic, Mucus membranes moist.  No thrush.  NG tube in place with thin bilious output Neck: Supple, No tracheal deviation.  No obvious thyromegaly Chest: No pain to chest wall compression.  Good respiratory excursion.  No audible wheezing CV:  Pulses intact.  Regular rhythm.  No major extremity edema MS: Normal AROM mjr joints.  No obvious deformity  Abdomen: Soft.  Nondistended.  Nontender.  Incisions clean dry and intact.  No  evidence of peritonitis.  No incarcerated hernias.  Ext:   No deformity.  No mjr edema.  No cyanosis Skin: No petechiae / purpurea.  No major sores.  Warm and dry    Results:    SURGICAL PATHOLOGY CASE: [id number] PATIENT: Christy Kirby Surgical Pathology Report     Clinical History: Mass in small intestine, probable carcinoid (crm)     FINAL MICROSCOPIC DIAGNOSIS:  A. VERIFORM APPENDIX, APPENDECTOMY: - Benign appendix with no specific histopathologic changes  B. ILEUM MASS, RESECTION: - Well-differentiated neuroendocrine tumor (G1; low-grade), 1.5 cm, involving ileum - Tumor involves the serosal surface - Resection margins are negative for tumor - Metastatic carcinoma to two thank you of three lymph nodes (2/3) - Mesenteric tumor deposit, 2.7 cm - See oncology table      ONCOLOGY TABLE:  JEJUNUM AND ILEUM NEUROENDOCRINE TUMOR: Resection  Procedure: Resection, ileum Tumor Site: Ileum Tumor Size: 1.5 cm Tumor Focality: Unifocal Histologic Type and Grade: Low-grade (G1)      Mitotic Rate: 1/10 HPF      Ki-67 Labeling Index: Approximately 1% Tumor Extension: Tumor invades into serosal surface Lymphovascular Invasion: Not identified Large Mesenteric Masses (>2 cm): Present Margins: All margins negative for tumor Regional Lymph Nodes:      Number of Lymph  Nodes with Tumor: 1      Number of Lymph Nodes Examined: 2 Distant metastasis:      Distant Site(s) Involved: Not applicable Pathologic Stage Classification (pTNM, AJCC 8th Edition): pT4, pN1 Ancillary Studies: Can be performed upon request Representative Tumor Block: B4 Comment(s): None (v1.1.0.0)      Christy Kirby DESCRIPTION:  Specimen A: Received fresh is a 9.7 cm in length and averaging 0.6 cm in diameter appendix which has a tan-pink smooth serosa and attached soft fatty tissue.  Cut surfaces are unremarkable.  Representative sections are submitted in 1 block.  Specimen B: Received fresh is a  23.5 cm unoriented length of intestine grossly consistent with small bowel and clinically identified as ileum. The serosa has an area of pale yellow indurated nodularity with umbilication 8.5 cm from 1 margin.  There is also attached mesentery which is focally involved with firm nodular tissue near the area of nodular umbilicated serosa.  On opening, there is a 1.5 cm in diameter and up to 0.8 cm thick yellow-white firm ill-defined intramural mass which has overlying pink-red smooth mucosa.  The mass is 8 and 14 cm from the margins of resection.  Remaining specimen has tan-pink to pink-green smooth soft mucosa with normal intestinal folds, and a wall averaging 0.4 cm thick.  The nodular portion of attached mesentery is 2.7 x 2.4 x 1.5 cm and has a pale yellow-white firm ill-defined cut surface.  This firm ill-defined tissue is present at the mesenteric margin (margin inked black).  No lymphoid tissue is identified. Block summary: Block 1 = margin of resection nearest intramural mass Block 2 = margin of resection furthest from intramural mass Blocks 3-6 = intramural mass, entirely submitted Blocks 7, 8 = sections of nodular tissue involving mesentery, including inked mesenteric margin  SW 10/10/2022   Final Diagnosis performed by Holley Bouche, MD.   Electronically signed 10/13/2022 Technical and / or Professional components performed at [facility], [address], [place], [place] [address].  Immunohistochemistry Technical component (if applicable) was performed at [facility]. [address], [place], [place] [address].   IMMUNOHISTOCHEMISTRY DISCLAIMER (if applicable): Some of these immunohistochemical stains may have been developed and the performance characteristics determine by [facility]. Some may not have been cleared or approved by the U.S. Food and Drug Administration. The FDA has determined that such clearance  or approval is not necessary. This test is used for clinical purposes. It should not be regarded as investigational or for research. This laboratory is certified under the Clinical Laboratory Improvement Amendments of 1988 (CLIA-88) as qualified to perform high complexity clinical laboratory testing.  The controls stained appropriately.    Cultures: No results found for this or any previous visit (from the past 720 hour(s)).  Labs: Results for orders placed or performed during the hospital encounter of 11/08/22 (from the past 48 hour(s))  Prealbumin     Status: None   Collection Time: 11/09/22  8:23 AM  Result Value Ref Range   Prealbumin 26 18 - 38 mg/dL    Comment: Performed at [facility], [address] [place], [place] [address]  CBC     Status: Abnormal   Collection Time: 11/10/22  4:52 AM  Result Value Ref Range   WBC 3.7 (L) 4.0 - 10.5 K/uL   RBC 3.34 (L) 3.87 - 5.11 MIL/uL   Hemoglobin 10.0 (L) 12.0 - 15.0 g/dL   HCT 91.4 (L) 78.2 - 95.6 %  MCV 92.2 80.0 - 100.0 fL   MCH 29.9 26.0 - 34.0 pg   MCHC 32.5 30.0 - 36.0 g/dL   RDW 40.9 81.1 - 91.4 %   Platelets 202 150 - 400 K/uL   nRBC 0.0 0.0 - 0.2 %    Comment: Performed at [facility], [address], [place], [place] [address]  Basic metabolic panel     Status: Abnormal   Collection Time: 11/10/22  4:52 AM  Result Value Ref Range   Sodium 135 135 - 145 mmol/L   Potassium 3.3 (L) 3.5 - 5.1 mmol/L   Chloride 102 98 - 111 mmol/L   CO2 25 22 - 32 mmol/L   Glucose, Bld 85 70 - 99 mg/dL    Comment: Glucose reference range applies only to samples taken after fasting for at least 8 hours.   BUN 7 (L) 8 - 23 mg/dL   Creatinine, Ser 6.21 0.44 - 1.00 mg/dL   Calcium 8.5 (L) 8.9 - 10.3 mg/dL   GFR, Estimated >30 >86 mL/min    Comment: (NOTE) Calculated using the CKD-EPI Creatinine Equation (2021)    Anion gap 8 5 - 15    Comment: Performed at [facility], [address], [place], [place] [address]  Magnesium     Status: None   Collection Time: 11/10/22  4:52 AM  Result Value Ref Range   Magnesium 1.8 1.7 - 2.4 mg/dL    Comment: Performed at [facility], [address] [place], [place] [address]  Basic metabolic panel     Status: Abnormal   Collection Time: 11/11/22  5:20 AM  Result Value Ref Range   Sodium 138 135 - 145 mmol/L   Potassium 4.4 3.5 - 5.1 mmol/L   Chloride 106 98 - 111 mmol/L   CO2 28 22 - 32 mmol/L   Glucose, Bld 97 70 - 99 mg/dL    Comment: Glucose reference range applies only to samples taken after fasting for at least 8 hours.   BUN 6 (L) 8 - 23 mg/dL   Creatinine, Ser 2.84 0.44 - 1.00 mg/dL   Calcium 8.9 8.9 - 13.2 mg/dL   GFR, Estimated 59 (L) >60 mL/min    Comment: (NOTE) Calculated using the CKD-EPI Creatinine Equation (2021)    Anion gap 4 (L) 5 - 15    Comment: Performed at [facility], [address], [place], [place] [address]  Magnesium     Status: None   Collection Time: 11/11/22  5:20 AM  Result Value Ref Range   Magnesium 1.9 1.7 - 2.4 mg/dL    Comment: Performed at [facility], [address] [place], [place] [address]     Imaging / Studies: No results found.  Medications / Allergies: per chart  Antibiotics: Anti-infectives (From admission, onward)    None         Note: Portions of this report may have been transcribed using voice recognition software. Every effort was made to ensure accuracy; however, inadvertent computerized transcription errors may be present.   Any transcriptional errors that result from this process are unintentional.    Ardeth Sportsman, MD, FACS, MASCRS Esophageal, Gastrointestinal & Colorectal Surgery Robotic and Minimally Invasive Surgery  [facility] [address] [place], [place] [address] [phone] Fax [phone] Main  CONTACT  INFORMATION:  Weekday (9AM-5PM): Call [facility] main office at [phone]  Weeknight (  5PM-9AM) or Weekend/Holiday: Check [email or web address] (password " [email or web address]") for General Surgery [facility] coverage  (Please, do not use SecureChat as it is not reliable communication to reach operating surgeons for immediate patient care given surgeries/outpatient duties/clinic/cross-coverage/off post-call which would lead to a delay in care.  Epic staff messaging available for outptient concerns, but may not be answered for 48 hours or more).     11/11/2022  7:09 AM

## 2022-11-11 NOTE — Progress Notes (Signed)
PROGRESS NOTE    Christy Kirby  ZOX:[id number] DOB: Apr 19, 1951 DOA: 11/08/2022 PCP: Nelwyn Salisbury, MD    Brief Narrative:   Christy Kirby is a 72 y.o. 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 Michaell Cowing), HTN no longer on antihypertensive therapy, history of H. pylori gastritis, GERD, osteoarthritis who presented to [facility] ED with abdominal pain.  Associated with nausea/vomiting.  Onset the day of arrival.  In the ED, temperature 98.3 F, HR 96, RR 20, BP 171/104, SpO2 100% on room air.  WBC 6.8, hemoglobin 12.7, platelets 267.  Sodium 139, potassium 3.2, chloride 103, CO2 20, glucose 135, BUN 13, creatinine 0.98.  Urinalysis unrevealing. CT abdomen/pelvis with contrast with postoperative changes right lower quadrant with fecalization of contents in the level of the anastomosis, proximal dilated fluid-filled small bowels suggesting small bowel obstruction, small amount of free fluid in the abdomen and pelvis likely reactive, tethering of right lower quadrant mesentery may indicate adhesions.  General surgery was consulted.  [facility] consulted for admission for further evaluation management of small bowel obstruction.  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  Recently underwent resection of her small bowel/ileum due to neuroendocrine tumor by Dr. Michaell Cowing on 10/07/2022.  CT Abdo/pelvis notable for proximal dilated fluid-filled small bowel suggesting obstruction and tethering of right lower quadrant mesentery concerning for adhesions. -- General surgery following, appreciate assistance -- NG tube removed yesterday -- Full liquid diet, advance to soft as tolerates per general  surgery -- Antiemetics as needed  Hypokalemia Potassium 4.4 this morning, magnesium 1.9. -- Continue IV fluids now with advance diet  Hx essential hypertension Currently not on antihypertensive medications outpatient. -- Hydralazine 5 mg IV every 6 hours as needed SBP >150  GERD -- Protonix 40 mg IV every 12 hours    DVT prophylaxis: enoxaparin (LOVENOX) injection 40 mg Start: 11/09/22 1000    Code Status: Full Code Family Communication: No family present at bedside this morning  Disposition Plan:  Level of care: Med-Surg Status is: Inpatient Remains inpatient appropriate because: NG tube removed yesterday, now on full liquid diet with orders for advancement to soft diet as tolerates, hopeful for discharge in 1-2 days    Consultants:  General surgery  Procedures:  None  Antimicrobials:  None  Subjective: Seen examined; walking in the hall.  NG tube discontinued yesterday, now on a full liquid diet with further orders for advancement to soft diet as tolerates.  Patient hopeful for discharge home likely tomorrow.  No questions or concerns at this time.  Denies headache, no dizziness, no chest pain, no palpitations, no shortness of breath, no current abdominal pain, no fever/chills/night sweats, no current nausea/vomiting, no diarrhea, no focal weakness, no fatigue, no paresthesias.  No acute events overnight per nursing staff.  Objective: Vitals:   11/10/22 1259 11/10/22 1622 11/10/22 2033 11/11/22 0543  BP: (!) 143/82 (!) 149/87 (!) 142/94 138/83  Pulse: 99 89 97 84  Resp: 17 16 18 18   Temp: 98.5 F (36.9 C) 98.6 F (37 C) 98.9 F (37.2 C) 98.3 F (36.8 C)  TempSrc: Oral Oral Oral Oral  SpO2: 99% 100% 100% 100%  Weight:      Height:        Intake/Output Summary (Last  24 hours) at 11/11/2022 1206 Last data filed at 11/11/2022 1610 Gross per 24 hour  Intake 1659.71 ml  Output 2250 ml  Net -590.29 ml   Filed Weights   11/09/22 0423  Weight: 61.2 kg     Examination:  Physical Exam: GEN: NAD, alert and oriented x 3, wd/wn HEENT: NCAT, PERRL, EOMI, sclera clear, MMM PULM: CTAB w/o wheezes/crackles, normal respiratory effort CV: RRR w/o M/G/R GI: abd soft, NTND, + bowel sounds MSK: no peripheral edema, moves all extremity independently NEURO: No focal neurological deficits appreciated PSYCH: normal mood/affect Integumentary: No concerning rash/lesions/wounds noted on exposed skin surfaces    Data Reviewed: I have personally reviewed following labs and imaging studies  CBC: Recent Labs  Lab 11/04/22 1221 11/08/22 2059 11/09/22 0439 11/10/22 0452  WBC 4.3 6.8 4.7 3.7*  NEUTROABS 2.7  --   --   --   HGB 11.2* 12.7 11.2* 10.0*  HCT 33.4* 37.6 34.3* 30.8*  MCV 91.3 89.3 92.2 92.2  PLT 249 267 225 202   Basic Metabolic Panel: Recent Labs  Lab 11/04/22 1221 11/08/22 2059 11/09/22 0439 11/10/22 0452 11/11/22 0520  NA 140 139 141 135 138  K 4.1 3.2* 3.7 3.3* 4.4  CL 105 103 107 102 106  CO2 29 20* 26 25 28   GLUCOSE 100* 135* 121* 85 97  BUN 16 13 11  7* 6*  CREATININE 0.86 0.98 0.90 0.87 1.00  CALCIUM 9.4 9.6 8.8* 8.5* 8.9  MG  --   --  2.0 1.8 1.9  PHOS  --   --  4.1  --   --    GFR: Estimated Creatinine Clearance: 43.6 mL/min (by C-G formula based on SCr of 1 mg/dL). Liver Function Tests: Recent Labs  Lab 11/04/22 1221 11/08/22 2059  AST 12* 18  ALT 6 10  ALKPHOS 42 42  BILITOT 0.4 1.0  PROT 7.3 7.9  ALBUMIN 3.8 4.0   Recent Labs  Lab 11/08/22 2059  LIPASE 26   No results for input(s): "AMMONIA" in the last 168 hours. Coagulation Profile: No results for input(s): "INR", "PROTIME" in the last 168 hours. Cardiac Enzymes: No results for input(s): "CKTOTAL", "CKMB", "CKMBINDEX", "TROPONINI" in the last 168 hours. BNP (last 3 results) No results for input(s): "PROBNP" in the last 8760 hours. HbA1C: No results for input(s): "HGBA1C" in the last 72 hours. CBG: No results for input(s): "GLUCAP" in  the last 168 hours. Lipid Profile: No results for input(s): "CHOL", "HDL", "LDLCALC", "TRIG", "CHOLHDL", "LDLDIRECT" in the last 72 hours. Thyroid Function Tests: No results for input(s): "TSH", "T4TOTAL", "FREET4", "T3FREE", "THYROIDAB" in the last 72 hours. Anemia Panel: No results for input(s): "VITAMINB12", "FOLATE", "FERRITIN", "TIBC", "IRON", "RETICCTPCT" in the last 72 hours. Sepsis Labs: No results for input(s): "PROCALCITON", "LATICACIDVEN" in the last 168 hours.  No results found for this or any previous visit (from the past 240 hour(s)).       Radiology Studies: No results found.      Scheduled Meds:  bisacodyl  10 mg Rectal Daily   enoxaparin (LOVENOX) injection  40 mg Subcutaneous Q24H   lip balm   Topical BID   pantoprazole  40 mg Oral BID   polyethylene glycol  17 g Oral Daily   simethicone  80 mg Oral QID   Continuous Infusions:     LOS: 2 days    Time spent: 50 minutes spent on chart review, discussion with nursing staff, consultants, updating family and interview/physical exam; more than 50% of  that time was spent in counseling and/or coordination of care.    Alvira Philips [place], DO [facility] Available via Epic secure chat 7am-7pm After these hours, please refer to coverage provider listed on [email or web address] 11/11/2022, 12:06 PM

## 2022-11-12 DIAGNOSIS — K56609 Unspecified intestinal obstruction, unspecified as to partial versus complete obstruction: Secondary | ICD-10-CM | POA: Diagnosis not present

## 2022-11-12 NOTE — Discharge Summary (Signed)
Physician Discharge Summary    Patient ID: Christy Kirby MRN: [id number] DOB/AGE: 11/18/1950  72 y.o.  Patient Care Team: Nelwyn Salisbury, MD as PCP - Erline Hau, MD as Consulting Physician (General Surgery) Hilarie Fredrickson, MD as Consulting Physician (Gastroenterology) Malachy Mood, MD as Consulting Physician (Oncology)  Admit date: 11/08/2022  Discharge date: 11/12/2022  Hospital Stay = 3 days    Discharge Diagnoses:  Principal Problem:   SBO (small bowel obstruction) (HCC) Active Problems:   Helicobacter pylori gastritis   Malignant carcinoid tumor of ileum [facility])    Consults: Case Management / Social Work, Physical Therapy, Occupational Therapy, and Internal Medicine (Hospitalist)  Hospital Course:   Patient with history recurrent bowel obstructions due to ileum mass that underwent robotic lyse adhesions and ileal small bowel resection 10/07/2022.  Had ileus but eventually went home.  Patient felt good for the next 3 weeks and then had episode of nausea and vomiting.  Can emergency department.  DC can worried about obstruction.  She was admitted and placed with NG tube and small bowel protocol.  She began to have flatus improvement and tolerated NG tube clamping trial.  Nasogastric tube removed and advanced on diet.  Postoperatively, the patient gradually mobilized and advanced to a solid diet.  Pain and other symptoms were treated aggressively.    By the time of discharge, the patient was walking well the hallways, eating food, having flatus.  Pain was well-controlled on an oral medications.  Based on meeting discharge criteria and continuing to recover, I felt it was safe for the patient to be discharged from the hospital to further recover with close followup. Postoperative recommendations were discussed in detail.  They are written as well.  Discharged Condition: good  Discharge Exam: Blood pressure 125/81, pulse 83, temperature 98.3 F (36.8 C), temperature  source Oral, resp. rate 17, height 5' 4.5" (1.638 m), weight 61.2 kg, SpO2 99 %.  General: Pt awake/alert/oriented x4 in No acute distress Eyes: PERRL, normal EOM.  Sclera clear.  No icterus Neuro: CN II-XII intact w/o focal sensory/motor deficits. Lymph: No head/neck/groin lymphadenopathy Psych:  No delerium/psychosis/paranoia HENT: Normocephalic, Mucus membranes moist.  No thrush Neck: Supple, No tracheal deviation Chest:  No chest wall pain w good excursion CV:  Pulses intact.  Regular rhythm MS: Normal AROM mjr joints.  No obvious deformity Abdomen: Soft.  Nondistended.  Nontender.  Incisions clean dry and intact with minimal healing ridge.  Colitis or abscess.  No evidence of peritonitis.  No incarcerated hernias. Ext:  SCDs BLE.  No mjr edema.  No cyanosis Skin: No petechiae / purpura   Disposition:    Follow-up Information     Karie Soda, MD. Call today.   Specialties: General Surgery, Colon and Rectal Surgery Why: If symptoms worsen, See [place] only as needed Contact information: [address] [place] [address] [phone]                 Discharge disposition: 01-Home or Self Care       Discharge Instructions     Call MD for:   Complete by: As directed    FEVER > 101.5 F  (temperatures < 101.5 F are not significant)   Call MD for:  extreme fatigue   Complete by: As directed    Call MD for:  persistant dizziness or light-headedness   Complete by: As directed    Call MD for:  persistant nausea and vomiting   Complete  by: As directed    Call MD for:  redness, tenderness, or signs of infection (pain, swelling, redness, odor or green/yellow discharge around incision site)   Complete by: As directed    Call MD for:  severe uncontrolled pain   Complete by: As directed    Diet - low sodium heart healthy   Complete by: As directed    Start with a bland diet such as soups, liquids, starchy foods, low fat foods, etc. the first few days at  home. Gradually advance to a solid, low-fat, high fiber diet by the end of the first week at home.   Add a fiber supplement to your diet (Metamucil, etc) If you feel full, bloated, or constipated, stay on a full liquid or pureed/blenderized diet for a few days until you feel better and are no longer constipated.   Discharge instructions   Complete by: As directed    See Discharge Instructions If you are not getting better after two weeks or are noticing you are getting worse, contact our office [phone] for further advice.  We may need to adjust your medications, re-evaluate you in the office, send you to the emergency room, or see what other things we can do to help. The clinic staff is available to answer your questions during regular business hours (8:30am-5pm).  Please don't hesitate to call and ask to speak to one of our nurses for clinical concerns.    A surgeon from [facility] is always on call at the hospitals 24 hours/day If you have a medical emergency, go to the nearest emergency room or call 911.   Discharge wound care:   Complete by: As directed    It is good for closed incisions and even open wounds to be washed every day.  Shower every day.  Short baths are fine.  Wash the incisions and wounds clean with soap & water.    You may leave closed incisions open to air if it is dry.   You may cover the incision with clean gauze & replace it after your daily shower for comfort.   Driving Restrictions   Complete by: As directed    You may drive when: - you are no longer taking narcotic prescription pain medication - you can comfortably wear a seatbelt - you can safely make sudden turns/stops without pain.   Increase activity slowly   Complete by: As directed    Start light daily activities --- self-care, walking, climbing stairs- beginning the day after surgery.  Gradually increase activities as tolerated.  Control your pain to be active.  Stop when you are tired.   Ideally, walk several times a day, eventually an hour a day.   Most people are back to most day-to-day activities in a few weeks.  It takes 4-6 weeks to get back to unrestricted, intense activity. If you can walk 30 minutes without difficulty, it is safe to try more intense activity such as jogging, treadmill, bicycling, low-impact aerobics, swimming, etc. Save the most intensive and strenuous activity for last (Usually 4-8 weeks after surgery) such as sit-ups, heavy lifting, contact sports, etc.  Refrain from any intense heavy lifting or straining until you are off narcotics for pain control.  You will have off days, but things should improve week-by-week. DO NOT PUSH THROUGH PAIN.  Let pain be your guide: If it hurts to do something, don't do it.   Lifting restrictions   Complete by: As directed    If  you can walk 30 minutes without difficulty, it is safe to try more intense activity such as jogging, treadmill, bicycling, low-impact aerobics, swimming, etc. Save the most intensive and strenuous activity for last (Usually 4-8 weeks after surgery) such as sit-ups, heavy lifting, contact sports, etc.   Refrain from any intense heavy lifting or straining until you are off narcotics for pain control.  You will have off days, but things should improve week-by-week. DO NOT PUSH THROUGH PAIN.  Let pain be your guide: If it hurts to do something, don't do it.  Pain is your body warning you to avoid that activity for another week until the pain goes down.   May shower / Bathe   Complete by: As directed    May walk up steps   Complete by: As directed    Remove dressing in 72 hours   Complete by: As directed    Make sure all dressings are removed by the third day after surgery.  Leave incisions open to air.  OK to cover incisions with gauze or bandages as desired   Sexual Activity Restrictions   Complete by: As directed    You may have sexual intercourse when it is comfortable. If it hurts to do something,  stop.       Allergies as of 11/12/2022       Reactions   Codeine Nausea Only   Nsaids Other (See Comments)   History of gastritis = minimize NSAIDs        Medication List     TAKE these medications    acetaminophen 325 MG tablet Commonly known as: TYLENOL Take 2 tablets (650 mg total) by mouth every 6 (six) hours as needed for mild pain or headache.   ondansetron 4 MG tablet Commonly known as: Zofran Take 1 tablet (4 mg total) by mouth every 8 (eight) hours as needed for nausea or vomiting.   traMADol 50 MG tablet Commonly known as: ULTRAM Take 1-2 tablets (50-100 mg total) by mouth every 6 (six) hours as needed for moderate pain or severe pain.               Discharge Care Instructions  (From admission, onward)           Start     Ordered   11/12/22 0000  Discharge wound care:       Comments: It is good for closed incisions and even open wounds to be washed every day.  Shower every day.  Short baths are fine.  Wash the incisions and wounds clean with soap & water.    You may leave closed incisions open to air if it is dry.   You may cover the incision with clean gauze & replace it after your daily shower for comfort.   11/12/22 0842            Significant Diagnostic Studies:  Results for orders placed or performed during the hospital encounter of 11/08/22 (from the past 72 hour(s))  CBC     Status: Abnormal   Collection Time: 11/10/22  4:52 AM  Result Value Ref Range   WBC 3.7 (L) 4.0 - 10.5 K/uL   RBC 3.34 (L) 3.87 - 5.11 MIL/uL   Hemoglobin 10.0 (L) 12.0 - 15.0 g/dL   HCT 16.1 (L) 09.6 - 04.5 %   MCV 92.2 80.0 - 100.0 fL   MCH 29.9 26.0 - 34.0 pg   MCHC 32.5 30.0 - 36.0 g/dL   RDW 40.9 81.1 -  15.5 %   Platelets 202 150 - 400 K/uL   nRBC 0.0 0.0 - 0.2 %    Comment: Performed at [facility], [address] [place], [place] [address]  Basic metabolic panel     Status: Abnormal   Collection Time: 11/10/22  4:52 AM  Result  Value Ref Range   Sodium 135 135 - 145 mmol/L   Potassium 3.3 (L) 3.5 - 5.1 mmol/L   Chloride 102 98 - 111 mmol/L   CO2 25 22 - 32 mmol/L   Glucose, Bld 85 70 - 99 mg/dL    Comment: Glucose reference range applies only to samples taken after fasting for at least 8 hours.   BUN 7 (L) 8 - 23 mg/dL   Creatinine, Ser 1.30 0.44 - 1.00 mg/dL   Calcium 8.5 (L) 8.9 - 10.3 mg/dL   GFR, Estimated >86 >57 mL/min    Comment: (NOTE) Calculated using the CKD-EPI Creatinine Equation (2021)    Anion gap 8 5 - 15    Comment: Performed at [facility], [address], [place], [place] [address]  Magnesium     Status: None   Collection Time: 11/10/22  4:52 AM  Result Value Ref Range   Magnesium 1.8 1.7 - 2.4 mg/dL    Comment: Performed at [facility], [address], [place], [place] [address]  Basic metabolic panel     Status: Abnormal   Collection Time: 11/11/22  5:20 AM  Result Value Ref Range   Sodium 138 135 - 145 mmol/L   Potassium 4.4 3.5 - 5.1 mmol/L   Chloride 106 98 - 111 mmol/L   CO2 28 22 - 32 mmol/L   Glucose, Bld 97 70 - 99 mg/dL    Comment: Glucose reference range applies only to samples taken after fasting for at least 8 hours.   BUN 6 (L) 8 - 23 mg/dL   Creatinine, Ser 4.13 0.44 - 1.00 mg/dL   Calcium 8.9 8.9 - 24.4 mg/dL   GFR, Estimated 59 (L) >60 mL/min    Comment: (NOTE) Calculated using the CKD-EPI Creatinine Equation (2021)    Anion gap 4 (L) 5 - 15    Comment: Performed at [facility], [address], [place], [place] [address]  Magnesium     Status: None   Collection Time: 11/11/22  5:20 AM  Result Value Ref Range   Magnesium 1.9 1.7 - 2.4 mg/dL    Comment: Performed at [facility], [address], [place], [place] [address]    DG Abd Portable 1V  Result Date: 11/09/2022 CLINICAL DATA:  Small bowel obstruction EXAM: PORTABLE ABDOMEN - 1 VIEW; PORTABLE CHEST - 1 VIEW COMPARISON:  CT scan  06/05/2022 FINDINGS: Nasogastric tube noted with tip in the stomach fundus. The lungs appear clear. No blunting of the costophrenic angles. Heart size within normal limits. No significant bony abnormality observed. IMPRESSION: 1. Nasogastric tube tip is in the stomach fundus. 2. The lungs appear clear. Electronically Signed   By: Gaylyn Rong M.D.   On: 11/09/2022 11:02   DG CHEST PORT 1 VIEW  Result Date: 11/09/2022 CLINICAL DATA:  Small bowel obstruction EXAM: PORTABLE ABDOMEN - 1 VIEW; PORTABLE CHEST - 1 VIEW COMPARISON:  CT scan 06/05/2022 FINDINGS: Nasogastric tube noted with tip in the stomach fundus. The lungs appear clear. No blunting of the costophrenic angles. Heart size within normal limits. No significant bony abnormality observed. IMPRESSION: 1. Nasogastric tube  tip is in the stomach fundus. 2. The lungs appear clear. Electronically Signed   By: Gaylyn Rong M.D.   On: 11/09/2022 11:02   DG Abdomen 1 View  Result Date: 11/09/2022 CLINICAL DATA:  Repositioning of nasogastric tube. EXAM: ABDOMEN - 1 VIEW COMPARISON:  November 09, 2022 FINDINGS: A nasogastric tube is seen with its distal tip overlying the expected region of the gastric fundus. The distal side hole is seen within the expected region of the body of the stomach. The bowel gas pattern is normal. Radiopaque contrast is seen within the bilateral renal collecting systems. IMPRESSION: Nasogastric tube positioning, as described above. Electronically Signed   By: Aram Candela M.D.   On: 11/09/2022 03:21   DG Abdomen 1 View  Result Date: 11/09/2022 CLINICAL DATA:  NG tube placement. EXAM: ABDOMEN - 1 VIEW COMPARISON:  None Available. FINDINGS: Mildly dilated loops of small bowel in the right lower quadrant measuring up to 3.1 cm. An enteric tube loops in the stomach and courses superiorly into the esophagus. Excreted contrast is noted in the renal collecting systems bilaterally and urinary bladder. Total hip arthroplasty changes  are present on the right. IMPRESSION: 1. Mildly distended loops of small bowel in the abdomen, possible ileus versus partial or early small bowel obstruction. 2. Enteric tube loops in the stomach and courses distally in the visualized esophagus. Repositioning is recommended. Electronically Signed   By: Thornell Sartorius M.D.   On: 11/09/2022 03:17   CT ABDOMEN PELVIS W CONTRAST  Result Date: 11/09/2022 CLINICAL DATA:  Acute nonlocalized abdominal pain. Upper abdominal pain, nausea, and vomiting. History of small-bowel carcinoma. Recent ileal mass removed on 10/07/2022. EXAM: CT ABDOMEN AND PELVIS WITH CONTRAST TECHNIQUE: Multidetector CT imaging of the abdomen and pelvis was performed using the standard protocol following bolus administration of intravenous contrast. RADIATION DOSE REDUCTION: This exam was performed according to the departmental dose-optimization program which includes automated exposure control, adjustment of the mA and/or kV according to patient size and/or use of iterative reconstruction technique. CONTRAST:  75mL OMNIPAQUE IOHEXOL 300 MG/ML  SOLN COMPARISON:  07/14/2022 and 06/07/2022 FINDINGS: Lower chest: Mild atelectasis in the lung bases. Hepatobiliary: No focal liver lesions. Cholelithiasis with several stones in the gallbladder. No wall thickening or inflammatory stranding. No bile duct dilatation. Pancreas: Unremarkable. No pancreatic ductal dilatation or surrounding inflammatory changes. Spleen: Normal in size without focal abnormality. Adrenals/Urinary Tract: Adrenal glands are unremarkable. Kidneys are normal, without renal calculi, focal lesion, or hydronephrosis. Bladder is unremarkable. Stomach/Bowel: Stomach is decompressed. Fluid-filled distended small bowel with distal decompression. Dilatation and fecalization at a right lower quadrant anastomosis. Changes likely to represent small bowel obstruction. No wall thickening or pneumatosis. Mesenteric fluid is likely reactive.  Mesenteric scarring and tethering in the right lower quadrant. Colon is mostly decompressed. Vascular/Lymphatic: No significant vascular findings are present. No enlarged abdominal or pelvic lymph nodes. Reproductive: Nodular appearance of the uterus consistent with fibroids. No abnormal adnexal masses. Other: Small amount of free fluid in the abdomen is likely reactive. No free air. Abdominal wall musculature appears intact. Musculoskeletal: Postoperative right hip arthroplasty. Degenerative changes in the spine. IMPRESSION: Postoperative changes in the right lower quadrant with fecalization of contents in the level of the anastomosis. Proximal dilated fluid-filled small bowel suggesting small bowel obstruction. Small amount of free fluid in the abdomen and pelvis is likely reactive. Tethering of right lower quadrant mesentery may indicate adhesions. Cholelithiasis without evidence of acute cholecystitis. Uterine fibroids. Electronically Signed   By: Chrissie Noa  Andria Meuse M.D.   On: 11/09/2022 00:37    Past Medical History:  Diagnosis Date   Arthritis    hip   GERD (gastroesophageal reflux disease)    Hyperlipidemia    Hypertension    Intractable nausea and vomiting 06/07/2022    Past Surgical History:  Procedure Laterality Date   BIOPSY  06/08/2022   Procedure: BIOPSY;  Surgeon: Meridee Score Netty Starring., MD;  Location: [facility];  Service: Gastroenterology;;   COLONOSCOPY  06/26/2017   per Dr. Marina Goodell, benign polyps, repeat in 10 yrs    ESOPHAGOGASTRODUODENOSCOPY (EGD) WITH PROPOFOL N/A 06/08/2022   Procedure: ESOPHAGOGASTRODUODENOSCOPY (EGD) WITH PROPOFOL;  Surgeon: Lemar Lofty., MD;  Location: [facility];  Service: Gastroenterology;  Laterality: N/A;   NEUROPLASTY / TRANSPOSITION MEDIAN NERVE AT CARPAL TUNNEL BILATERAL     POLYPECTOMY     TOTAL HIP ARTHROPLASTY Right 01/11/2018   Procedure: RIGHT TOTAL HIP ARTHROPLASTY ANTERIOR APPROACH;  Surgeon: Samson Frederic, MD;  Location: [facility];   Service: Orthopedics;  Laterality: Right;   XI ROBOTIC ASSISTED SMALL BOWEL RESECTION N/A 10/07/2022   Procedure: XI ROBOTIC ASSISTED SMALL BOWEL RESECTION, BILATERAL TAP BLOCK, ASSESSMENT OF TISSUE PERFUSSION VIA FIREFLY INJECTION;  Surgeon: Karie Soda, MD;  Location: [facility];  Service: General;  Laterality: N/A;    Social History   Socioeconomic History   Marital status: Legally Separated    Spouse name: Not on file   Number of children: Not on file   Years of education: Not on file   Highest education level: Not on file  Occupational History   Not on file  Tobacco Use   Smoking status: Former    Years: 1    Types: Cigarettes   Smokeless tobacco: Never   Tobacco comments:    only when [age] years old briefly   Vaping Use   Vaping Use: Never used  Substance and Sexual Activity   Alcohol use: Not Currently    Comment: occ wine    Drug use: No   Sexual activity: Not Currently  Other Topics Concern   Not on file  Social History Narrative   Not on file   Social Determinants of Health   Financial Resource Strain: Low Risk  (04/05/2022)   Overall Financial Resource Strain (CARDIA)    Difficulty of Paying Living Expenses: Not hard at all  Food Insecurity: No Food Insecurity (11/09/2022)   Hunger Vital Sign    Worried About Running Out of Food in the Last Year: Never true    Ran Out of Food in the Last Year: Never true  Transportation Needs: No Transportation Needs (11/09/2022)   PRAPARE - Administrator, Civil Service (Medical): No    Lack of Transportation (Non-Medical): No  Physical Activity: Inactive (04/05/2022)   Exercise Vital Sign    Days of Exercise per Week: 0 days    Minutes of Exercise per Session: 0 min  Stress: No Stress Concern Present (04/05/2022)   [employer] of Occupational Health - Occupational Stress Questionnaire    Feeling of Stress : Not at all  Social Connections: Moderately Integrated (04/05/2022)   Social Connection and  Isolation Panel [NHANES]    Frequency of Communication with Friends and Family: More than three times a week    Frequency of Social Gatherings with Friends and Family: More than three times a week    Attends Religious Services: More than 4 times per year    Active Member of [employer] or Organizations: Yes  Attends Banker Meetings: More than 4 times per year    Marital Status: Separated  Intimate Partner Violence: Not At Risk (11/09/2022)   Humiliation, Afraid, Rape, and Kick questionnaire    Fear of Current or Ex-Partner: No    Emotionally Abused: No    Physically Abused: No    Sexually Abused: No    Family History  Problem Relation Age of Onset   Diabetes Sister    Colon cancer Neg Hx    Colon polyps Neg Hx    Esophageal cancer Neg Hx    Rectal cancer Neg Hx    Stomach cancer Neg Hx     Current Facility-Administered Medications  Medication Dose Route Frequency Provider Last Rate Last Admin   alum & mag hydroxide-simeth (MAALOX/MYLANTA) 200-200-20 MG/5ML suspension 30 mL  30 mL Oral Q6H PRN Karie Soda, MD       bisacodyl (DULCOLAX) suppository 10 mg  10 mg Rectal Daily Karie Soda, MD   10 mg at 11/10/22 1610   bisacodyl (DULCOLAX) suppository 10 mg  10 mg Rectal Q12H PRN Karie Soda, MD       diazepam (VALIUM) injection 5-10 mg  5-10 mg Intravenous Q8H PRN Karie Soda, MD       diphenhydrAMINE (BENADRYL) injection 12.5-25 mg  12.5-25 mg Intravenous Q6H PRN Karie Soda, MD       enoxaparin (LOVENOX) injection 40 mg  40 mg Subcutaneous Q24H Hall, Carole N, DO   40 mg at 11/11/22 0847   hydrALAZINE (APRESOLINE) injection 5 mg  5 mg Intravenous Q6H PRN Dow Adolph N, DO   5 mg at 11/09/22 9604   hydrocortisone cream 1 % 1 Application  1 Application Topical TID PRN [place], Eric J, DO   1 Application at 11/11/22 2100   HYDROmorphone (DILAUDID) injection 0.5-1 mg  0.5-1 mg Intravenous Q4H PRN Karie Soda, MD       lip balm (CARMEX) ointment   Topical BID Karie Soda, MD   75 Application at 11/11/22 2100   magic mouthwash  15 mL Oral QID PRN Karie Soda, MD       menthol-cetylpyridinium (CEPACOL) lozenge 3 mg  1 lozenge Oral PRN Karie Soda, MD       metoCLOPramide (REGLAN) injection 5-10 mg  5-10 mg Intravenous Q8H PRN Karie Soda, MD       pantoprazole (PROTONIX) EC tablet 40 mg  40 mg Oral BID Karie Soda, MD   40 mg at 11/11/22 2100   phenol (CHLORASEPTIC) mouth spray 2 spray  2 spray Mouth/Throat PRN Karie Soda, MD       polyethylene glycol (MIRALAX / GLYCOLAX) packet 17 g  17 g Oral Daily Karie Soda, MD   17 g at 11/11/22 0848   polyethylene glycol (MIRALAX / GLYCOLAX) packet 17 g  17 g Oral Q12H PRN Karie Soda, MD       prochlorperazine (COMPAZINE) injection 5-10 mg  5-10 mg Intravenous Q4H PRN Karie Soda, MD       simethicone (MYLICON) chewable tablet 80 mg  80 mg Oral QID Karie Soda, MD   80 mg at 11/11/22 2100     Allergies  Allergen Reactions   Codeine Nausea Only   Nsaids Other (See Comments)    History of gastritis = minimize NSAIDs    Signed:   Ardeth Sportsman, MD, FACS, MASCRS Esophageal, Gastrointestinal & Colorectal Surgery Robotic and Minimally Invasive Surgery  [facility] [address]  [address] [place], [place] [address] [phone] Fax [phone] Main  CONTACT INFORMATION:  Weekday (9AM-5PM): Call [facility] main office at [phone]  Weeknight (5PM-9AM) or Weekend/Holiday: Check [email or web address] (password " [email or web address]") for General Surgery [facility] coverage  (Please, do not use SecureChat as it is not reliable communication to reach operating surgeons for immediate patient care given surgeries/outpatient duties/clinic/cross-coverage/off post-call which would lead to a delay in care.  Epic staff messaging available for outptient concerns, but may not be answered for 48 hours or more).     11/12/2022, 8:43 AM

## 2022-11-12 NOTE — Discharge Instructions (Signed)
EATING AFTER A SMALL BOWEL OBSTRUCTION   EAT START WITH PUREED OR SOFT FOODS Gradually transition to a high fiber diet with a fiber supplement over the next few days after discharge  WALK Walk an hour a day.  Control your pain to do that.    CONTROL PAIN Control pain so that you can walk, sleep, tolerate sneezing/coughing, go up/down stairs.  HAVE A BOWEL MOVEMENT DAILY Keep your bowels regular to avoid problems.  OK to try a laxative to override constipation.  OK to use an antidairrheal to slow down diarrhea.  Call if not better after 2 tries  CALL IF YOU HAVE PROBLEMS/CONCERNS Call if you are still struggling despite following these instructions. Call if you have concerns not answered by these instructions     After your attack of SMALL BOWEL OBSTRUCTION, expect some issues over the next few weeks.    To help you through this temporary phase, we start you out on a pureed (blenderized) diet.  Your first meal in the hospital was thin liquids.  You should have been given a pureed diet by the time you left the hospital.  We ask patients to stay on a pureed diet for the first few days to avoid anything getting "stuck."  Don't be alarmed if your ability to swallow doesn't progress according to this plan.  Everyone is different and some diets can advance more or less quickly.     Some BASIC RULES to follow are: Maintain an upright position whenever eating or drinking. Take small bites - just a teaspoon size bite at a time. Eat slowly.  It may also help to eat only one food at a time. Consider nibbling through smaller, more frequent meals & avoid the urge to eat BIG meals Do not push through feelings of fullness, nausea, or bloatedness Do not mix solid foods and liquids in the same mouthful Try not to "wash foods down" with large gulps of liquids. Avoid carbonated (bubbly/fizzy) drinks.   Avoid foods that make you feel gassy or bloated.  Start with bland foods first.  Wait on trying  greasy, fried, or spicy meals until you are tolerating more bland solids well. Expect to be more gassy/flatulent/bloated initially.  Walking will help your body manage it better. Consider using medications for bloating that contain simethicone such as  Maalox or Gas-X  Eat in a relaxed atmosphere & minimize distractions. Avoid talking while eating.   Do not use straws. Following each meal, sit in an upright position (90 degree angle) for 60 to 90 minutes.  Going for a short walk can help as well If food does stick, don't panic.  Try to relax and let the food pass on its own.  Sipping WARM LIQUID such as strong hot black tea can also help slide it down.   Be gradual in changes & use common sense:  -If you easily tolerating a certain "level" of foods, advance to the next level gradually -If you are having trouble swallowing a particular food, then avoid it.   -If food is sticking when you advance your diet, go back to thinner previous diet (the lower LEVEL) for 1-2 days.  LEVEL 1 = PUREED DIET  Do for the first FEW DAYS AFTER LEAVING THE HOSPITAL  -Foods in this group are pureed or blenderized to a smooth, mashed potato-like consistency.  -If necessary, the pureed foods can keep their shape with the addition of a thickening agent.   -Meat should be pureed to a   smooth, pasty consistency.  Hot broth or gravy may be added to the pureed meat, approximately 1 oz. of liquid per 3 oz. serving of meat. -CAUTION:  If any foods do not puree into a smooth consistency, swallowing will be more difficult.  (For example, nuts or seeds sometimes do not blend well.)  Hot Foods Cold Foods  Pureed scrambled eggs and cheese Pureed cottage cheese  Baby cereals Thickened juices and nectars  Thinned cooked cereals (no lumps) Thickened milk or eggnog  Pureed French toast or pancakes Ensure  Mashed potatoes Ice cream  Pureed parsley, au gratin, scalloped potatoes, candied sweet potatoes Fruit or Italian ice,  sherbet  Pureed buttered or alfredo noodles Plain yogurt  Pureed vegetables (no corn or peas) Instant breakfast  Pureed soups and creamed soups Smooth pudding, mousse, custard  Pureed scalloped apples Whipped gelatin  Gravies Sugar, syrup, honey, jelly  Sauces, cheese, tomato, barbecue, white, creamed Cream  Any baby food Creamer  Alcohol in moderation (not beer or champagne) Margarine  Coffee or tea Mayonnaise   Ketchup, mustard   Apple sauce   SAMPLE MENU:  PUREED DIET Breakfast Lunch Dinner  Orange juice, 1/2 cup Cream of wheat, 1/2 cup Pineapple juice, 1/2 cup Pureed turkey, barley soup, 3/4 cup Pureed Hawaiian chicken, 3 oz  Scrambled eggs, mashed or blended with cheese, 1/2 cup Tea or coffee, 1 cup  Whole milk, 1 cup  Non-dairy creamer, 2 Tbsp. Mashed potatoes, 1/2 cup Pureed cooled broccoli, 1/2 cup Apple sauce, 1/2 cup Coffee or tea Mashed potatoes, 1/2 cup Pureed spinach, 1/2 cup Frozen yogurt, 1/2 cup Tea or coffee      LEVEL 2 = SOFT DIET  After your first few days, you can advance to a soft, low residue diet.   Keep on this diet until everything goes down easily.  Hot Foods Cold Foods  White fish Cottage cheese  Stuffed fish Junior baby fruit  Baby food meals Semi thickened juices  Minced soft cooked, scrambled, poached eggs nectars  Souffle & omelets Ripe mashed bananas  Cooked cereals Canned fruit, pineapple sauce, milk  potatoes Milkshake  Buttered or Alfredo noodles Custard  Cooked cooled vegetable Puddings, including tapioca  Sherbet Yogurt  Vegetable soup or alphabet soup Fruit ice, Italian ice  Gravies Whipped gelatin  Sugar, syrup, honey, jelly Junior baby desserts  Sauces:  Cheese, creamed, barbecue, tomato, white Cream  Coffee or tea Margarine   SAMPLE MENU:  LEVEL 2 Breakfast Lunch Dinner  Orange juice, 1/2 cup Oatmeal, 1/2 cup Scrambled eggs with cheese, 1/2 cup Decaffeinated tea, 1 cup Whole milk, 1 cup Non-dairy creamer, 2 Tbsp  Pineapple juice, 1/2 cup Minced beef, 3 oz Gravy, 2 Tbsp Mashed potatoes, 1/2 cup Minced fresh broccoli, 1/2 cup Applesauce, 1/2 cup Coffee, 1 cup Turkey, barley soup, 3/4 cup Minced Hawaiian chicken, 3 oz Mashed potatoes, 1/2 cup Cooked spinach, 1/2 cup Frozen yogurt, 1/2 cup Non-dairy creamer, 2 Tbsp      LEVEL 3 = CHOPPED DIET  -After all the foods in level 2 (soft diet) are passing through well you should advance up to more chopped foods.  -It is still important to cut these foods into small pieces and eat slowly.  Hot Foods Cold Foods  Poultry Cottage cheese  Chopped Swedish meatballs Yogurt  Meat salads (ground or flaked meat) Milk  Flaked fish (tuna) Milkshakes  Poached or scrambled eggs Soft, cold, dry cereal  Souffles and omelets Fruit juices or nectars  Cooked cereals Chopped canned   fruit  Chopped French toast or pancakes Canned fruit cocktail  Noodles or pasta (no rice) Pudding, mousse, custard  Cooked vegetables (no frozen peas, corn, or mixed vegetables) Green salad  Canned small sweet peas Ice cream  Creamed soup or vegetable soup Fruit ice, Italian ice  Pureed vegetable soup or alphabet soup Non-dairy creamer  Ground scalloped apples Margarine  Gravies Mayonnaise  Sauces:  Cheese, creamed, barbecue, tomato, white Ketchup  Coffee or tea Mustard   SAMPLE MENU:  LEVEL 3 Breakfast Lunch Dinner  Orange juice, 1/2 cup Oatmeal, 1/2 cup Scrambled eggs with cheese, 1/2 cup Decaffeinated tea, 1 cup Whole milk, 1 cup Non-dairy creamer, 2 Tbsp Ketchup, 1 Tbsp Margarine, 1 tsp Salt, 1/4 tsp Sugar, 2 tsp Pineapple juice, 1/2 cup Ground beef, 3 oz Gravy, 2 Tbsp Mashed potatoes, 1/2 cup Cooked spinach, 1/2 cup Applesauce, 1/2 cup Decaffeinated coffee Whole milk Non-dairy creamer, 2 Tbsp Margarine, 1 tsp Salt, 1/4 tsp Pureed turkey, barley soup, 3/4 cup Barbecue chicken, 3 oz Mashed potatoes, 1/2 cup Ground fresh broccoli, 1/2 cup Frozen yogurt, 1/2  cup Decaffeinated tea, 1 cup Non-dairy creamer, 2 Tbsp Margarine, 1 tsp Salt, 1/4 tsp Sugar, 1 tsp    LEVEL 4:  HIGH FIBER DIET / REGULAR FOODS  -Foods in this group are soft, moist, regularly textured foods.   -This level includes meat and breads, which tend to be the hardest things to swallow.   -Eat very slowly, chew well and continue to avoid carbonated drinks. -most people are at this level in 2-4 weeks  Hot Foods Cold Foods  Baked fish or skinned Soft cheeses - cottage cheese  Souffles and omelets Cream cheese  Eggs Yogurt  Stuffed shells Milk  Spaghetti with meat sauce Milkshakes  Cooked cereal Cold dry cereals (no nuts, dried fruit, coconut)  French toast or pancakes Crackers  Buttered toast Fruit juices or nectars  Noodles or pasta (no rice) Canned fruit  Potatoes (all types) Ripe bananas  Soft, cooked vegetables (no corn, lima, or baked beans) Peeled, ripe, fresh fruit  Creamed soups or vegetable soup Cakes (no nuts, dried fruit, coconut)  Canned chicken noodle soup Plain doughnuts  Gravies Ice cream  Bacon dressing Pudding, mousse, custard  Sauces:  Cheese, creamed, barbecue, tomato, white Fruit ice, Italian ice, sherbet  Decaffeinated tea or coffee Whipped gelatin  Pork chops Regular gelatin   Canned fruited gelatin molds   Sugar, syrup, honey, jam, jelly   Cream   Non-dairy   Margarine   Oil   Mayonnaise   Ketchup   Mustard   TROUBLESHOOTING IRREGULAR BOWELS  1) Avoid extremes of bowel movements (no bad constipation/diarrhea)  2) Miralax 17gm mixed in 8oz. water or juice-daily. May use BID as needed.  3) Gas-x,Phazyme, etc. as needed for gas & bloating.  4) Soft,bland diet. No spicy,greasy,fried foods.  5) Prilosec over-the-counter as needed  6) May hold gluten/wheat products from diet to see if symptoms improve.  7) May try probiotics (Align, Activa, etc) to help calm the bowels down  7) If symptoms become worse call back immediately.    If you  have any questions please call our office at [facility]: [phone].     This information is not intended to replace advice given to you by your health care provider. Make sure you discuss any questions you have with your health care provider.      Bowel Obstruction A bowel obstruction is a blockage in the small or large bowel. The   bowel, which is also called the intestine, is a long, slender tube that connects the stomach to the anus. When a person eats and drinks, food and fluids go from the mouth to the stomach to the small bowel. This is where most of the nutrients in the food and fluids are absorbed. After the small bowel, material passes through the large bowel for further absorption until any leftover material leaves the body as stool through the anus during a bowel movement. A bowel obstruction will prevent food and fluids from passing through the bowel as they normally do during digestion. The bowel can become partially or completely blocked. If this condition is not treated, it can be dangerous because the bowel could rupture. What are the causes? Common causes of this condition include: Scar tissue (adhesions) from previous surgery or treatment with high-energy X-rays (radiation). Recent surgery. This may cause the movements of the bowel to slow down and cause food to block the intestine. Inflammatory bowel disease, such as Crohn's disease or diverticulitis. Growths or tumors. A bulging organ (hernia). Twisting of the bowel (volvulus). A foreign body. Slipping of a part of the bowel into another part (intussusception). What are the signs or symptoms? Symptoms of this condition include: Pain in the abdomen. Depending on the degree of obstruction, pain may be: Mild or severe. Dull cramping or sharp pain. In one area or in the entire abdomen. Nausea and vomiting. Vomit may be greenish or a yellow bile color. Bloating in the abdomen. Difficulty passing stool  (constipation). Lack of passing gas. Frequent belching. Diarrhea. This may occur if the obstruction is partial and runny stool is able to leak around the obstruction. How is this diagnosed? This condition may be diagnosed based on: A physical exam. Medical history. Imaging tests of the abdomen or pelvis, such as X-ray or CT scan. Blood or urine tests. How is this treated? Treatment for this condition depends on the cause and severity of the problem. Treatment may include: Fluids and pain medicines that are given through an IV. Your health care provider may instruct you not to eat or drink if you have nausea or vomiting. Eating a simple diet. You may be asked to consume a clear liquid diet for several days. This allows the bowel to rest. Placement of a small tube (nasogastric tube) into the stomach. This will relieve pain, discomfort, and nausea by removing blocked air and fluids from the stomach. It can also help the obstruction clear up faster. Surgery. This may be required if other treatments do not work. Surgery may be required for: Bowel obstruction from a hernia. This can be an emergency procedure. Scar tissue that causes frequent or severe obstructions. Follow these instructions at home: Medicines Take over-the-counter and prescription medicines only as told by your health care provider. If you were prescribed an antibiotic medicine, take it as told by your health care provider. Do not stop taking the antibiotic even if you start to feel better. General instructions Follow instructions from your health care provider about eating restrictions. You may need to avoid solid foods and consume only clear liquids until your condition improves. Return to your normal activities as told by your health care provider. Ask your health care provider what activities are safe for you. Avoid sitting for a long time without moving. Get up to take short walks every 1-2 hours. This is important to improve  blood flow and breathing. Ask for help if you feel weak or unsteady. Keep all follow-up visits   as told by your health care provider. This is important. How is this prevented? After having a bowel obstruction, you are more likely to have another. You may do the following things to prevent another obstruction: If you have a long-term (chronic) disease, pay attention to your symptoms and contact your health care provider if you have questions or concerns. Avoid becoming constipated. To prevent or treat constipation, your health care provider may recommend that you: Drink enough fluid to keep your urine pale yellow. Take over-the-counter or prescription medicines. Eat foods that are high in fiber, such as beans, whole grains, and fresh fruits and vegetables. Limit foods that are high in fat and processed sugars, such as fried or sweet foods. Stay active. Exercise for 30 minutes or more, 5 or more days each week. Ask your health care provider which exercises are safe for you. Avoid stress. Find ways to reduce stress, such as meditation, exercise, or taking time for activities that relax you. Instead of eating three large meals each day, eat three small meals with three small snacks. Work with a dietitian to make a healthy meal plan that works for you. Do not use any products that contain nicotine or tobacco, such as cigarettes and e-cigarettes. If you need help quitting, ask your health care provider. Contact a health care provider if you: Have a fever. Have chills. Get help right away if you: Have increased pain or cramping. Vomit blood. Have uncontrolled vomiting or nausea. Cannot drink fluids because of vomiting or pain. Become confused. Begin feeling very thirsty (dehydrated). Have severe bloating. Feel extremely weak or you faint. Summary A bowel obstruction is a blockage in the small or large bowel. A bowel obstruction will prevent food and fluids from passing through the bowel as they  normally do during digestion. Treatment for this condition depends on the cause and severity of the problem. It may include fluids and pain medicines through an IV, a simple diet, a nasogastric tube, or surgery. Follow instructions from your health care provider about eating restrictions. You may need to avoid solid foods and consume only clear liquids until your condition improves.     GETTING TO GOOD BOWEL HEALTH.  ######################################################################  EAT Gradually transition to a high fiber diet with a fiber supplement over the next few weeks after discharge.  Start with a pureed / full liquid diet (see below)  WALK Walk an hour a day.  Control your pain to do that.    HAVE A BOWEL MOVEMENT DAILY Keep your bowels regular to avoid problems.  OK to try a laxative to override constipation.  OK to use an antidairrheal to slow down diarrhea.  Call if not better after 2 tries  CALL IF YOU HAVE PROBLEMS/CONCERNS Call if you are still struggling despite following these instructions. Call if you have concerns not answered by these instructions  ######################################################################   Irregular bowel habits such as constipation and diarrhea can lead to many problems over time.  Having one soft bowel movement a day is the most important way to prevent further problems.  The anorectal canal is designed to handle stretching and feces to safely manage our ability to get rid of solid waste (feces, poop, stool) out of our body.  BUT, hard constipated stools can act like ripping concrete bricks and diarrhea can be a burning fire to this very sensitive area of our body, causing inflamed hemorrhoids, anal fissures, increasing risk is perirectal abscesses, abdominal pain/bloating, an making irritable bowel worse.        The goal: ONE SOFT BOWEL MOVEMENT A DAY!  To have soft, regular bowel movements:  Drink plenty of fluids, consider 4-6  tall glasses of water a day.   Take plenty of fiber.  Fiber is the undigested part of plant food that passes into the colon, acting s "natures broom" to encourage bowel motility and movement.  Fiber can absorb and hold large amounts of water. This results in a larger, bulkier stool, which is soft and easier to pass. Work gradually over several weeks up to 6 servings a day of fiber (25g a day even more if needed) in the form of: Vegetables -- Root (potatoes, carrots, turnips), leafy green (lettuce, salad greens, celery, spinach), or cooked high residue (cabbage, broccoli, etc) Fruit -- Fresh (unpeeled skin & pulp), Dried (prunes, apricots, cherries, etc ),  or stewed ( applesauce)  Whole grain breads, pasta, etc (whole wheat)  Bran cereals  Bulking Agents -- This type of water-retaining fiber generally is easily obtained each day by one of the following:  Psyllium bran -- The psyllium plant is remarkable because its ground seeds can retain so much water. This product is available as Metamucil, Konsyl, Effersyllium, Per Diem Fiber, or the less expensive generic preparation in drug and health food stores. Although labeled a laxative, it really is not a laxative.  Methylcellulose -- This is another fiber derived from wood which also retains water. It is available as Citrucel. Polyethylene Glycol - and "artificial" fiber commonly called Miralax or Glycolax.  It is helpful for people with gassy or bloated feelings with regular fiber Flax Seed - a less gassy fiber than psyllium No reading or other relaxing activity while on the toilet. If bowel movements take longer than 5 minutes, you are too constipated AVOID CONSTIPATION.  High fiber and water intake usually takes care of this.  Sometimes a laxative is needed to stimulate more frequent bowel movements, but  Laxatives are not a good long-term solution as it can wear the colon out.  They can help jump-start bowels if constipated, but should be relied on  constantly without discussing with your doctor Osmotics (Milk of Magnesia, Fleets phosphosoda, Magnesium citrate, MiraLax, GoLytely) are safer than  Stimulants (Senokot, Castor Oil, Dulcolax, Ex Lax)    Avoid taking laxatives for more than 7 days in a row.  IF SEVERELY CONSTIPATED, try a Bowel Retraining Program: Do not use laxatives.  Eat a diet high in roughage, such as bran cereals and leafy vegetables.  Drink six (6) ounces of prune or apricot juice each morning.  Eat two (2) large servings of stewed fruit each day.  Take one (1) heaping tablespoon of a psyllium-based bulking agent twice a day. Use sugar-free sweetener when possible to avoid excessive calories.  Eat a normal breakfast.  Set aside 15 minutes after breakfast to sit on the toilet, but do not strain to have a bowel movement.  If you do not have a bowel movement by the third day, use an enema and repeat the above steps.   CONTROLLING DIARRHEA  TAKE A FIBER SUPPLEMENT (FiberCon or Benefiner soluble fiber) twice a day - to thicken stools by absorbing excess fluid and retrain the intestines to act more normally.  Slowly increase the dose over a few weeks.  Too much fiber too soon can backfire and cause cramping & bloating.  TAKE AN IRON SUPPLEMENT twice a day to naturally constipate your bowels.  Usually ferrous sulfate 325mg twice a day)  TAKE ANTI-DIARRHEAL MEDICINES: Loperamide (Imodium)   can slow down diarrhea.  Start with two tablets (= 4mg) first and then try one tablet every 6 hours.  Can go up to 2 pills four times day (8 pills of 2mg max) Avoid if you are having fevers or severe pain.  If you are not better or start feeling worse, stop all medicines and call your doctor for advice LoMotil (Diphenoxylate / Atropine) is another medicine that can constipate & slow down bowel moevements Pepto Bismol (bismuth) can gently thicken bowels as well  If diarrhea is worse,: drink plenty of liquids and try simpler foods for a few  days to avoid stressing your intestines further. Avoid dairy products (especially milk & ice cream) for a short time.  The intestines often can lose the ability to digest lactose when stressed. Avoid foods that cause gassiness or bloating.  Typical foods include beans and other legumes, cabbage, broccoli, and dairy foods.  Every person has some sensitivity to other foods, so listen to our body and avoid those foods that trigger problems for you.Call your doctor if you are getting worse or not better.  Sometimes further testing (cultures, endoscopy, X-ray studies, bloodwork, etc) may be needed to help diagnose and treat the cause of the diarrhea. Take extra anti-diarrheal medicines (maximum is 8 pills of 2mg loperamide a day)  TROUBLESHOOTING IRREGULAR BOWELS 1) Avoid extremes of bowel movements (no bad constipation/diarrhea) 2) Miralax 17gm mixed in 8oz. water or juice-daily. May use BID as needed.  3) Gas-x,Phazyme, etc. as needed for gas & bloating.  4) Soft,bland diet. No spicy,greasy,fried foods.  5) Prilosec over-the-counter as needed  6) May hold gluten/wheat products from diet to see if symptoms improve.  7)  May try probiotics (Align, Activa, etc) to help calm the bowels down 7) If symptoms become worse call back immediately.  

## 2022-11-12 NOTE — Plan of Care (Signed)
  Problem: Education: Goal: Knowledge of General Education information will improve Description: Including pain rating scale, medication(s)/side effects and non-pharmacologic comfort measures Outcome: Adequate for Discharge   Problem: Health Behavior/Discharge Planning: Goal: Ability to manage health-related needs will improve Outcome: Adequate for Discharge   Problem: Clinical Measurements: Goal: Ability to maintain clinical measurements within normal limits will improve Outcome: Adequate for Discharge Goal: Will remain free from infection Outcome: Adequate for Discharge Goal: Diagnostic test results will improve Outcome: Adequate for Discharge Goal: Respiratory complications will improve Outcome: Adequate for Discharge Goal: Cardiovascular complication will be avoided Outcome: Adequate for Discharge   Problem: Activity: Goal: Risk for activity intolerance will decrease Outcome: Adequate for Discharge   Problem: Nutrition: Goal: Adequate nutrition will be maintained Outcome: Adequate for Discharge   Problem: Coping: Goal: Level of anxiety will decrease Outcome: Adequate for Discharge   Problem: Elimination: Goal: Will not experience complications related to bowel motility Outcome: Adequate for Discharge Goal: Will not experience complications related to urinary retention Outcome: Adequate for Discharge   Problem: Pain Managment: Goal: General experience of comfort will improve Outcome: Adequate for Discharge   Problem: Safety: Goal: Ability to remain free from injury will improve Outcome: Adequate for Discharge   Problem: Skin Integrity: Goal: Risk for impaired skin integrity will decrease Outcome: Adequate for Discharge   Problem: Increased Nutrient Needs (NI-5.1) Goal: Food and/or nutrient delivery Description: Individualized approach for food/nutrient provision. Outcome: Adequate for Discharge   

## 2022-11-12 NOTE — Progress Notes (Signed)
PROGRESS NOTE    Christy Kirby  ZOX:[id number] DOB: 1950-12-13 DOA: 11/08/2022 PCP: Nelwyn Salisbury, MD    Brief Narrative:   Christy Kirby is a 72 y.o. 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 Michaell Cowing), HTN no longer on antihypertensive therapy, history of H. pylori gastritis, GERD, osteoarthritis who presented to [facility] ED with abdominal pain.  Associated with nausea/vomiting.  Onset the day of arrival.  In the ED, temperature 98.3 F, HR 96, RR 20, BP 171/104, SpO2 100% on room air.  WBC 6.8, hemoglobin 12.7, platelets 267.  Sodium 139, potassium 3.2, chloride 103, CO2 20, glucose 135, BUN 13, creatinine 0.98.  Urinalysis unrevealing. CT abdomen/pelvis with contrast with postoperative changes right lower quadrant with fecalization of contents in the level of the anastomosis, proximal dilated fluid-filled small bowels suggesting small bowel obstruction, small amount of free fluid in the abdomen and pelvis likely reactive, tethering of right lower quadrant mesentery may indicate adhesions.  General surgery was consulted.  TRH consulted for admission for further evaluation management of small bowel obstruction.  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  Recently underwent resection of her small bowel/ileum due to neuroendocrine tumor by Dr. Michaell Cowing on 10/07/2022.  CT Abdo/pelvis notable for proximal dilated fluid-filled small bowel suggesting obstruction and tethering of right lower quadrant mesentery concerning for adhesions. -- General surgery following, appreciate assistance -- NG tube removed; tolerating advance diet -- Discharging home today per general  surgery  Hypokalemia Repleted during hospitalization.  Hx essential hypertension Currently not on antihypertensive medications outpatient.  GERD PPI   DVT prophylaxis: enoxaparin (LOVENOX) injection 40 mg Start: 11/09/22 1000    Code Status: Full Code Family Communication: No family present at bedside this morning  Disposition Plan:  Level of care: Med-Surg Status is: Inpatient Remains inpatient appropriate because: Discharging today per general surgery    Consultants:  General surgery  Procedures:  None  Antimicrobials:  None  Subjective: Seen examined; walking in the hall.  Discussed with general surgery, Dr. Michaell Cowing will be discharging home today as tolerating advance diet.  Patient with no other questions or concerns at this time.  Denies headache, no dizziness, no chest pain, no palpitations, no shortness of breath, no current abdominal pain, no fever/chills/night sweats, no current nausea/vomiting, no diarrhea, no focal weakness, no fatigue, no paresthesias.  No acute events overnight per nursing staff.  Objective: Vitals:   11/11/22 0543 11/11/22 1319 11/11/22 2037 11/12/22 0533  BP: 138/83 (!) 141/86 (!) 143/81 125/81  Pulse: 84 81 89 83  Resp: 18 16 17 17   Temp: 98.3 F (36.8 C) 98.5 F (36.9 C) 98.4 F (36.9 C) 98.3 F (36.8 C)  TempSrc: Oral Oral Oral Oral  SpO2: 100% 100% 100% 99%  Weight:      Height:        Intake/Output Summary (Last 24 hours) at 11/12/2022 0959 Last data filed at 11/12/2022 0600 Gross per 24 hour  Intake 840 ml  Output 1050 ml  Net -210 ml   Filed Weights   11/09/22 0423  Weight: 61.2 kg    Examination:  Physical Exam: GEN: NAD, alert and oriented x 3, wd/wn HEENT: NCAT, PERRL, EOMI, sclera clear, MMM PULM: CTAB w/o wheezes/crackles, normal respiratory effort  CV: RRR w/o M/G/R GI: abd soft, NTND, + bowel sounds MSK: no peripheral edema, moves all extremity independently NEURO: No focal neurological deficits  appreciated PSYCH: normal mood/affect Integumentary: No concerning rash/lesions/wounds noted on exposed skin surfaces    Data Reviewed: I have personally reviewed following labs and imaging studies  CBC: Recent Labs  Lab 11/08/22 2059 11/09/22 0439 11/10/22 0452  WBC 6.8 4.7 3.7*  HGB 12.7 11.2* 10.0*  HCT 37.6 34.3* 30.8*  MCV 89.3 92.2 92.2  PLT 267 225 202   Basic Metabolic Panel: Recent Labs  Lab 11/08/22 2059 11/09/22 0439 11/10/22 0452 11/11/22 0520  NA 139 141 135 138  K 3.2* 3.7 3.3* 4.4  CL 103 107 102 106  CO2 20* 26 25 28   GLUCOSE 135* 121* 85 97  BUN 13 11 7* 6*  CREATININE 0.98 0.90 0.87 1.00  CALCIUM 9.6 8.8* 8.5* 8.9  MG  --  2.0 1.8 1.9  PHOS  --  4.1  --   --    GFR: Estimated Creatinine Clearance: 43.6 mL/min (by C-G formula based on SCr of 1 mg/dL). Liver Function Tests: Recent Labs  Lab 11/08/22 2059  AST 18  ALT 10  ALKPHOS 42  BILITOT 1.0  PROT 7.9  ALBUMIN 4.0   Recent Labs  Lab 11/08/22 2059  LIPASE 26   No results for input(s): "AMMONIA" in the last 168 hours. Coagulation Profile: No results for input(s): "INR", "PROTIME" in the last 168 hours. Cardiac Enzymes: No results for input(s): "CKTOTAL", "CKMB", "CKMBINDEX", "TROPONINI" in the last 168 hours. BNP (last 3 results) No results for input(s): "PROBNP" in the last 8760 hours. HbA1C: No results for input(s): "HGBA1C" in the last 72 hours. CBG: No results for input(s): "GLUCAP" in the last 168 hours. Lipid Profile: No results for input(s): "CHOL", "HDL", "LDLCALC", "TRIG", "CHOLHDL", "LDLDIRECT" in the last 72 hours. Thyroid Function Tests: No results for input(s): "TSH", "T4TOTAL", "FREET4", "T3FREE", "THYROIDAB" in the last 72 hours. Anemia Panel: No results for input(s): "VITAMINB12", "FOLATE", "FERRITIN", "TIBC", "IRON", "RETICCTPCT" in the last 72 hours. Sepsis Labs: No results for input(s): "PROCALCITON", "LATICACIDVEN" in the last 168 hours.  No results found  for this or any previous visit (from the past 240 hour(s)).       Radiology Studies: No results found.      Scheduled Meds:  bisacodyl  10 mg Rectal Daily   enoxaparin (LOVENOX) injection  40 mg Subcutaneous Q24H   lip balm   Topical BID   pantoprazole  40 mg Oral BID   polyethylene glycol  17 g Oral Daily   Continuous Infusions:     LOS: 3 days    Time spent: 50 minutes spent on chart review, discussion with nursing staff, consultants, updating family and interview/physical exam; more than 50% of that time was spent in counseling and/or coordination of care.    Alvira Philips [place], DO [facility] Available via Epic secure chat 7am-7pm After these hours, please refer to coverage provider listed on [email or web address] 11/12/2022, 9:59 AM

## 2022-11-12 NOTE — Progress Notes (Signed)
Assessment unchanged. Pt verbalized understanding of dc instructions through teach back including medications, follow up care and when to call the doctor. Discharged via wc to front entrance accompanied by NT. 

## 2022-11-14 ENCOUNTER — Telehealth: Payer: Self-pay | Admitting: *Deleted

## 2022-11-14 NOTE — Transitions of Care (Post Inpatient/ED Visit) (Signed)
   11/14/2022  Name: Christy Kirby MRN: [id number] DOB: 1950-10-12  Today's TOC FU Call Status: Today's TOC FU Call Status:: Successful TOC FU Call Competed TOC FU Call Complete Date: 11/14/22  Transition Care Management Follow-up Telephone Call Date of Discharge: 11/12/22 Discharge Facility: [facility]) Type of Discharge: Inpatient Admission Primary Inpatient Discharge Diagnosis:: SBO How have you been since you were released from the hospital?: Better (I am having BM without any problems) Any questions or concerns?: No  Items Reviewed: Did you receive and understand the discharge instructions provided?: Yes Medications obtained,verified, and reconciled?: Yes (Medications Reviewed) Any new allergies since your discharge?: No Dietary orders reviewed?: Yes Type of Diet Ordered:: bland diet and increase as tolerated Do you have support at home?: Yes People in Home: spouse Name of Support/Comfort Primary Source: Johnnie  Medications Reviewed Today: Medications Reviewed Today     Reviewed by Jola Schmidt, CPhT (Pharmacy Technician) on 11/09/22 at [phone]  Med List Status: Complete   Medication Order Taking? Sig Documenting Provider Last Dose Status Informant  acetaminophen (TYLENOL) 325 MG tablet [id number] Yes Take 2 tablets (650 mg total) by mouth every 6 (six) hours as needed for mild pain or headache. Rodolph Bong, MD [place] Active Self, Pharmacy Records  ondansetron [facility]) 4 MG tablet [id number] Yes Take 1 tablet (4 mg total) by mouth every 8 (eight) hours as needed for nausea or vomiting. Gaynelle Adu, MD 11/08/2022 Active Self, Pharmacy Records  traMADol (ULTRAM) 50 MG tablet [id number] Yes Take 1-2 tablets (50-100 mg total) by mouth every 6 (six) hours as needed for moderate pain or severe pain. Karie Soda, MD 11/08/2022 Active Self, Pharmacy Records            Home Care and Equipment/Supplies: Were Home Health Services Ordered?: NA Any new equipment or  medical supplies ordered?: NA  Functional Questionnaire: Do you need assistance with bathing/showering or dressing?: No Do you need assistance with meal preparation?: Yes Do you need assistance with eating?: No Do you have difficulty maintaining continence: No Do you need assistance with getting out of bed/getting out of a chair/moving?: No Do you have difficulty managing or taking your medications?: No  Follow up appointments reviewed: PCP Follow-up appointment confirmed?: NA Specialist Hospital Follow-up appointment confirmed?: NA (Per DR Gross comntact as needed) Do you need transportation to your follow-up appointment?: No Do you understand care options if your condition(s) worsen?: Yes-patient verbalized understanding  SDOH Interventions Today    Flowsheet Row Most Recent Value  SDOH Interventions   Food Insecurity Interventions Intervention Not Indicated  Housing Interventions Intervention Not Indicated  Transportation Interventions Intervention Not Indicated      Interventions Today    Flowsheet Row Most Recent Value  General Interventions   General Interventions Discussed/Reviewed General Interventions Discussed, General Interventions Reviewed, Doctor Visits  Doctor Visits Discussed/Reviewed Doctor Visits Discussed  [Dr gross instructioned patient to come as needed. Patient did not feel seh needed follow up with PCP]  Nutrition Interventions   Nutrition Discussed/Reviewed Nutrition Discussed, Nutrition Reviewed  [RN reiterated Start with a bland diet such as soups, liquids, starchy foods, low fat foods, etc. the first few days at home.  Gradually advance to a solid, low-fat, high fiber diet by the end of the first week at home.]        Gean Maidens BSN RN [facility] [phone]

## 2022-11-16 ENCOUNTER — Other Ambulatory Visit: Payer: Medicare HMO | Admitting: *Deleted

## 2022-11-16 ENCOUNTER — Inpatient Hospital Stay: Payer: Medicare HMO | Attending: Physician Assistant

## 2022-11-16 DIAGNOSIS — C7A012 Malignant carcinoid tumor of the ileum: Secondary | ICD-10-CM | POA: Diagnosis not present

## 2022-11-16 DIAGNOSIS — C179 Malignant neoplasm of small intestine, unspecified: Secondary | ICD-10-CM

## 2022-11-23 ENCOUNTER — Encounter (HOSPITAL_COMMUNITY)
Admission: RE | Admit: 2022-11-23 | Discharge: 2022-11-23 | Disposition: A | Discharge status: Home / Self Care | Payer: Medicare HMO | Source: Ambulatory Visit | Attending: Physician Assistant | Admitting: Physician Assistant

## 2022-11-23 DIAGNOSIS — C7A098 Malignant carcinoid tumors of other sites: Secondary | ICD-10-CM | POA: Diagnosis not present

## 2022-11-23 DIAGNOSIS — C7A8 Other malignant neuroendocrine tumors: Secondary | ICD-10-CM | POA: Diagnosis not present

## 2022-11-23 LAB — 5 HIAA, QUANTITATIVE, URINE, 24 HOUR
5-HIAA, Ur: 1.9 mg/L
5-HIAA,Quant.,24 Hr Urine: 4.5 mg/24 hr (ref 0.0–14.9)
Total Volume: 2350

## 2022-11-23 MED ORDER — COPPER CU 64 DOTATATE 1 MCI/ML IV SOLN
4.0000 | Freq: Once | INTRAVENOUS | Status: AC
  Administered 2022-11-23: 4 via INTRAVENOUS

## 2022-11-29 ENCOUNTER — Telehealth: Payer: Self-pay | Admitting: *Deleted

## 2022-11-29 NOTE — Telephone Encounter (Signed)
Called patient to let her results of PET. Small spot was noted on abdominal wall. Cassie will be talking to surgeon and tumor board. Will see Cassie, Dr Mosetta Putt or Clayborn Heron in 2 weeks to discuss plan.  Verbalized understanding

## 2022-11-30 ENCOUNTER — Other Ambulatory Visit: Payer: Self-pay | Admitting: Family Medicine

## 2022-12-05 NOTE — Progress Notes (Deleted)
[facility] OFFICE PROGRESS NOTE  Nelwyn Salisbury, MD [address] [place] [address]  DIAGNOSIS: Carcinoid tumor   Oncology History  Malignant carcinoid tumor of ileum (HCC)  06/07/2022 Imaging   CT ABDOMEN PELVIS W CONTRAST   IMPRESSION: 1. Stranding in the ileal mesentery surrounding a low-density area in hindsight as seen on the prior study. This low-density area measures 19 x 17 mm. There is suggestion of some peripheral and nodular enhancement on coronal images. Findings are nonspecific and are of uncertain significance. Differential diagnosis would include Meckel's diverticulitis or cystic/necrotic lymph node or nodal metastasis in the ileal mesentery. Given nodular enhancement at the periphery underlying neoplasm would be difficult to exclude. Interloop abscess is felt less likely but given stranding not entirely excluded. Would also correlate with any signs of history of GI bleeding. Surgical consultation may be helpful. 2. No signs of pneumatosis or other secondary signs of bowel compromise. 3. Cholelithiasis without evidence of acute cholecystitis. 4. Trace free fluid in the pelvis, of uncertain significance. 5. Uterus with multiple leiomyomata, not well assessed on CT due to streak artifact from RIGHT hip arthroplasty. 6. Aortic atherosclerosis.   Aortic Atherosclerosis (ICD10-I70.0).     Electronically Signed   By: Donzetta Kohut M.D.   On: 06/07/2022 11:47   07/14/2022 Imaging   CT ENTERO ABD/PELVIS W CONTAST   IMPRESSION: 11 mm distal small bowel lesion, suspicious for small bowel carcinoid.   Associated ileal mesenteric nodes correspond to suspected mesenteric carcinoid.   Cholelithiasis, without associated inflammatory changes.   10/07/2022 Procedure   ROBOTIC ILEAL RESECTION by Dr. Michaell Cowing    10/07/2022 Initial Diagnosis   Well-differentiated neuroendocrine tumor (G1; low-grade) of the ileum    10/07/2022 Pathology Results    SURGICAL PATHOLOGY  CASE: [id number]   B. ILEUM MASS, RESECTION:  - Well-differentiated neuroendocrine tumor (G1; low-grade), 1.5 cm,  involving ileum  - Tumor involves the serosal surface  - Resection margins are negative for tumor  - Metastatic tumor to two of three lymph nodes (2/3)  - Mesenteric tumor deposit, 2.7 cm  - See oncology table    11/04/2022 Initial Diagnosis   Carcinoid tumor of ileum     CURRENT THERAPY: ***Observation vs. Sandostatin   INTERVAL HISTORY: Christy Kirby 72 y.o. female returns to the clinic today for follow-up visit.  She was seen in the clinic on 11/04/2022 to establish care with Dr. Mosetta Putt.  She underwent robotic ileal resection by Dr. Michaell Cowing on 10/07/2022.  The final pathology showed well-differentiated neuroendocrine tumor low-grade with a Ki-67 of 1%.  There was metastatic tumor to 2 of 3 lymph nodes and mesenteric deposits.  Therefore this was staged as pT4 pN1.  The patient establish care with Dr. Mosetta Putt and myself on 11/04/2022.  At that point in time Dr. Mosetta Putt recommended a dotatate scan to complete staging workup.  If negative, then Dr. Mosetta Putt would recommend observation with follow-up in 6 months.  He also completed the workup with the 24-hour urine and chromogranin A.  The patient had a dotatate scan which showed a focal area of tracer activity in the ventral peritoneal space in the mid right abdomen consistent with somatostatin receptor positive mesenteric metastasis.  Discussed with Dr. Michaell Cowing who informed there was no role for surgery.   4, the patient is here today to discuss next steps.  In the interval since last being seen, the patient was hospitalized for small bowel obstruction on 6/5 to 11/12/2022.  Is being discharged, she is feeling ***.  Today she denies any fever, chills, night sweats, or unexplained weight loss.  Denies any appetite changes.  Denies any chest pain, shortness of breath, cough, or hemoptysis.  Denies any odynophagia or  dysphagia.  Denies any significant abdominal pain.  Denies any nausea, vomiting, diarrhea, or constipation.  Denies any flushing.  Denies any wheezing.  Denies any unusual back pain or bone pain.  Denies any blood in the stool.  She is here today for evaluation and to discuss her neck steps.  MEDICAL HISTORY: Past Medical History:  Diagnosis Date   Arthritis    hip   GERD (gastroesophageal reflux disease)    Hyperlipidemia    Hypertension    Intractable nausea and vomiting 06/07/2022    ALLERGIES:  is allergic to codeine and nsaids.  MEDICATIONS:  Current Outpatient Medications  Medication Sig Dispense Refill   acetaminophen (TYLENOL) 325 MG tablet Take 2 tablets (650 mg total) by mouth every 6 (six) hours as needed for mild pain or headache.     metoprolol tartrate (LOPRESSOR) 25 MG tablet TAKE 1/2 TABLET TWICE A DAY BY MOUTH 90 tablet 3   ondansetron (ZOFRAN) 4 MG tablet Take 1 tablet (4 mg total) by mouth every 8 (eight) hours as needed for nausea or vomiting. 20 tablet 0   traMADol (ULTRAM) 50 MG tablet Take 1-2 tablets (50-100 mg total) by mouth every 6 (six) hours as needed for moderate pain or severe pain. 20 tablet 0   No current facility-administered medications for this visit.    SURGICAL HISTORY:  Past Surgical History:  Procedure Laterality Date   BIOPSY  06/08/2022   Procedure: BIOPSY;  Surgeon: Meridee Score Netty Starring., MD;  Location: [facility];  Service: Gastroenterology;;   COLONOSCOPY  06/26/2017   per Dr. Marina Goodell, benign polyps, repeat in 10 yrs    ESOPHAGOGASTRODUODENOSCOPY (EGD) WITH PROPOFOL N/A 06/08/2022   Procedure: ESOPHAGOGASTRODUODENOSCOPY (EGD) WITH PROPOFOL;  Surgeon: Lemar Lofty., MD;  Location: [facility];  Service: Gastroenterology;  Laterality: N/A;   NEUROPLASTY / TRANSPOSITION MEDIAN NERVE AT CARPAL TUNNEL BILATERAL     POLYPECTOMY     TOTAL HIP ARTHROPLASTY Right 01/11/2018   Procedure: RIGHT TOTAL HIP ARTHROPLASTY ANTERIOR APPROACH;   Surgeon: Samson Frederic, MD;  Location: [facility];  Service: Orthopedics;  Laterality: Right;   XI ROBOTIC ASSISTED SMALL BOWEL RESECTION N/A 10/07/2022   Procedure: XI ROBOTIC ASSISTED SMALL BOWEL RESECTION, BILATERAL TAP BLOCK, ASSESSMENT OF TISSUE PERFUSSION VIA FIREFLY INJECTION;  Surgeon: Karie Soda, MD;  Location: [facility];  Service: General;  Laterality: N/A;    REVIEW OF SYSTEMS:   Review of Systems  Constitutional: Negative for appetite change, chills, fatigue, fever and unexpected weight change.  HENT:   Negative for mouth sores, nosebleeds, sore throat and trouble swallowing.   Eyes: Negative for eye problems and icterus.  Respiratory: Negative for cough, hemoptysis, shortness of breath and wheezing.   Cardiovascular: Negative for chest pain and leg swelling.  Gastrointestinal: Negative for abdominal pain, constipation, diarrhea, nausea and vomiting.  Genitourinary: Negative for bladder incontinence, difficulty urinating, dysuria, frequency and hematuria.   Musculoskeletal: Negative for back pain, gait problem, neck pain and neck stiffness.  Skin: Negative for itching and rash.  Neurological: Negative for dizziness, extremity weakness, gait problem, headaches, light-headedness and seizures.  Hematological: Negative for adenopathy. Does not bruise/bleed easily.  Psychiatric/Behavioral: Negative for confusion, depression and sleep disturbance. The patient is not nervous/anxious.     PHYSICAL EXAMINATION:  There were no vitals taken for this visit.  ECOG PERFORMANCE STATUS: {CHL ONC ECOG [id number]  Physical Exam  Constitutional: Oriented to person, place, and time and well-developed, well-nourished, and in no distress. No distress.  HENT:  Head: Normocephalic and atraumatic.  Mouth/Throat: Oropharynx is clear and moist. No oropharyngeal exudate.  Eyes: Conjunctivae are normal. Right eye exhibits no discharge. Left eye exhibits no discharge. No scleral icterus.  Neck: Normal  range of motion. Neck supple.  Cardiovascular: Normal rate, regular rhythm, normal heart sounds and intact distal pulses.   Pulmonary/Chest: Effort normal and breath sounds normal. No respiratory distress. No wheezes. No rales.  Abdominal: Soft. Bowel sounds are normal. Exhibits no distension and no mass. There is no tenderness.  Musculoskeletal: Normal range of motion. Exhibits no edema.  Lymphadenopathy:    No cervical adenopathy.  Neurological: Alert and oriented to person, place, and time. Exhibits normal muscle tone. Gait normal. Coordination normal.  Skin: Skin is warm and dry. No rash noted. Not diaphoretic. No erythema. No pallor.  Psychiatric: Mood, memory and judgment normal.  Vitals reviewed.  LABORATORY DATA: Lab Results  Component Value Date   WBC 3.7 (L) 11/10/2022   HGB 10.0 (L) 11/10/2022   HCT 30.8 (L) 11/10/2022   MCV 92.2 11/10/2022   PLT 202 11/10/2022      Chemistry      Component Value Date/Time   NA 138 11/11/2022 0520   K 4.4 11/11/2022 0520   CL 106 11/11/2022 0520   CO2 28 11/11/2022 0520   BUN 6 (L) 11/11/2022 0520   CREATININE 1.00 11/11/2022 0520   CREATININE 0.86 11/04/2022 1221      Component Value Date/Time   CALCIUM 8.9 11/11/2022 0520   ALKPHOS 42 11/08/2022 2059   AST 18 11/08/2022 2059   AST 12 (L) 11/04/2022 1221   ALT 10 11/08/2022 2059   ALT 6 11/04/2022 1221   BILITOT 1.0 11/08/2022 2059   BILITOT 0.4 11/04/2022 1221       RADIOGRAPHIC STUDIES:  NM PET DOTATATE SKULL BASE TO MID THIGH  Result Date: 11/27/2022 CLINICAL DATA:  Endocrine tumor of small bowel. Small-bowel resection 4.0 10/07/2022. EXAM: NUCLEAR MEDICINE PET SKULL BASE TO THIGH TECHNIQUE: 4.0 mCi copper 64 DOTATATE was injected intravenously. Full-ring PET imaging was performed from the skull base to thigh after the radiotracer. CT data was obtained and used for attenuation correction and anatomic localization. COMPARISON:  CT 11/09/2022, 07/14/2022 FINDINGS: NECK  No radiotracer activity in neck lymph nodes. Incidental CT findings: None CHEST No radiotracer accumulation within mediastinal or hilar lymph nodes. No suspicious pulmonary nodules on the CT scan. Incidental CT finding:None ABDOMEN/PELVIS Postsurgical change consistent partial small bowel resection. Resection of the distal ileum. There is a focus of increased radiotracer in the ventral peritoneal space of the mid RIGHT abdomen just off midline with SUV max equal 10.0 on image 130. There is a small mesenteric nodule measuring 8 mm (image 131/CT series 4) which co-registers with the radiotracer activity. No additional mesenteric radiotracer avid foci. No Focal radiotracer abnormality within the bowel above background physiologic activity. No radiotracer activity in the liver above background physiologic activity. Physiologic activity noted in the liver, spleen, adrenal glands and kidneys. Incidental CT findings:Uterus and adnexa unremarkable. SKELETON No focal activity to suggest skeletal metastasis. Incidental CT findings:No sclerotic or lytic lesions on CT imaging. RIGHT hip arthroplasty IMPRESSION: 1. Single focus of radiotracer activity in the ventral peritoneal space of the mid RIGHT abdomen is consistent with somatostatin  receptor positive mesenteric metastasis (well differentiated neuroendocrine tumor). 2. No evidence of bowel metastasis. Postsurgical change consistent with partial distal small bowel resection. 3. No evidence of liver metastasis. 4. No evidence of metastatic disease in the chest or skeleton. Electronically Signed   By: Genevive Bi M.D.   On: 11/27/2022 10:19   DG Abd Portable 1V  Result Date: 11/09/2022 CLINICAL DATA:  Small bowel obstruction EXAM: PORTABLE ABDOMEN - 1 VIEW; PORTABLE CHEST - 1 VIEW COMPARISON:  CT scan 06/05/2022 FINDINGS: Nasogastric tube noted with tip in the stomach fundus. The lungs appear clear. No blunting of the costophrenic angles. Heart size within normal  limits. No significant bony abnormality observed. IMPRESSION: 1. Nasogastric tube tip is in the stomach fundus. 2. The lungs appear clear. Electronically Signed   By: Gaylyn Rong M.D.   On: 11/09/2022 11:02   DG CHEST PORT 1 VIEW  Result Date: 11/09/2022 CLINICAL DATA:  Small bowel obstruction EXAM: PORTABLE ABDOMEN - 1 VIEW; PORTABLE CHEST - 1 VIEW COMPARISON:  CT scan 06/05/2022 FINDINGS: Nasogastric tube noted with tip in the stomach fundus. The lungs appear clear. No blunting of the costophrenic angles. Heart size within normal limits. No significant bony abnormality observed. IMPRESSION: 1. Nasogastric tube tip is in the stomach fundus. 2. The lungs appear clear. Electronically Signed   By: Gaylyn Rong M.D.   On: 11/09/2022 11:02   DG Abdomen 1 View  Result Date: 11/09/2022 CLINICAL DATA:  Repositioning of nasogastric tube. EXAM: ABDOMEN - 1 VIEW COMPARISON:  November 09, 2022 FINDINGS: A nasogastric tube is seen with its distal tip overlying the expected region of the gastric fundus. The distal side hole is seen within the expected region of the body of the stomach. The bowel gas pattern is normal. Radiopaque contrast is seen within the bilateral renal collecting systems. IMPRESSION: Nasogastric tube positioning, as described above. Electronically Signed   By: Aram Candela M.D.   On: 11/09/2022 03:21   DG Abdomen 1 View  Result Date: 11/09/2022 CLINICAL DATA:  NG tube placement. EXAM: ABDOMEN - 1 VIEW COMPARISON:  None Available. FINDINGS: Mildly dilated loops of small bowel in the right lower quadrant measuring up to 3.1 cm. An enteric tube loops in the stomach and courses superiorly into the esophagus. Excreted contrast is noted in the renal collecting systems bilaterally and urinary bladder. Total hip arthroplasty changes are present on the right. IMPRESSION: 1. Mildly distended loops of small bowel in the abdomen, possible ileus versus partial or early small bowel obstruction. 2.  Enteric tube loops in the stomach and courses distally in the visualized esophagus. Repositioning is recommended. Electronically Signed   By: Thornell Sartorius M.D.   On: 11/09/2022 03:17   CT ABDOMEN PELVIS W CONTRAST  Result Date: 11/09/2022 CLINICAL DATA:  Acute nonlocalized abdominal pain. Upper abdominal pain, nausea, and vomiting. History of small-bowel carcinoma. Recent ileal mass removed on 10/07/2022. EXAM: CT ABDOMEN AND PELVIS WITH CONTRAST TECHNIQUE: Multidetector CT imaging of the abdomen and pelvis was performed using the standard protocol following bolus administration of intravenous contrast. RADIATION DOSE REDUCTION: This exam was performed according to the departmental dose-optimization program which includes automated exposure control, adjustment of the mA and/or kV according to patient size and/or use of iterative reconstruction technique. CONTRAST:  75mL OMNIPAQUE IOHEXOL 300 MG/ML  SOLN COMPARISON:  07/14/2022 and 06/07/2022 FINDINGS: Lower chest: Mild atelectasis in the lung bases. Hepatobiliary: No focal liver lesions. Cholelithiasis with several stones in the gallbladder. No wall thickening or  inflammatory stranding. No bile duct dilatation. Pancreas: Unremarkable. No pancreatic ductal dilatation or surrounding inflammatory changes. Spleen: Normal in size without focal abnormality. Adrenals/Urinary Tract: Adrenal glands are unremarkable. Kidneys are normal, without renal calculi, focal lesion, or hydronephrosis. Bladder is unremarkable. Stomach/Bowel: Stomach is decompressed. Fluid-filled distended small bowel with distal decompression. Dilatation and fecalization at a right lower quadrant anastomosis. Changes likely to represent small bowel obstruction. No wall thickening or pneumatosis. Mesenteric fluid is likely reactive. Mesenteric scarring and tethering in the right lower quadrant. Colon is mostly decompressed. Vascular/Lymphatic: No significant vascular findings are present. No enlarged  abdominal or pelvic lymph nodes. Reproductive: Nodular appearance of the uterus consistent with fibroids. No abnormal adnexal masses. Other: Small amount of free fluid in the abdomen is likely reactive. No free air. Abdominal wall musculature appears intact. Musculoskeletal: Postoperative right hip arthroplasty. Degenerative changes in the spine. IMPRESSION: Postoperative changes in the right lower quadrant with fecalization of contents in the level of the anastomosis. Proximal dilated fluid-filled small bowel suggesting small bowel obstruction. Small amount of free fluid in the abdomen and pelvis is likely reactive. Tethering of right lower quadrant mesentery may indicate adhesions. Cholelithiasis without evidence of acute cholecystitis. Uterine fibroids. Electronically Signed   By: Burman Nieves M.D.   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 Treated for gastric -CT imaging on 06/07/2022 showed low-density area in the small bowel 19 x 17 mm.  -She followed up outpatient with Dr. Marina Goodell from GI.  -CT enterography on 07/14/2022 showed 11 mm distal small bowel lesion suspicious for small bowel carcinoid and associated ileal mesenteric nodes.  -Underwent ileal resection by Dr. Michaell Cowing on 10/07/2022.  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 Ki-67 1%. Also mesenteric tumor deposit measuring 2.7 cm was resected.  Staged as a T4, N1.  -Established care with Dr. Mosetta Putt and myself on 11/04/22 -Baseline 24 huor 5-HIAA and chromogranin A were  normal. Chromogranin A 52.5 at baseline --Dr. Mosetta Putt recommends dotatate scan.  Was performed on 11/23/2022 which showed a  focus of radiotracer activity in the ventral peritoneal space in the mid right abdomen consistent with somatostatin Positive mesenteric metastasis. -Reviewed with Dr. Michaell Cowing to discuss there is no role of surgery -4, the patient was seen with Dr. Mosetta Putt today.  Dr. Mosetta Putt discussed octreotide injections versus close monitoring.  The patient opted for *** -We will call her with the results.  -Dr. Mosetta Putt previously discussed there is a 30-40% chance the tumor can come back in the future. Surveillance includes labs work every 6 months and CT surveillance imaging 1x per year or every other year  -We will obtain baseline CBC, CMP, Chromogranin A, in 6 months    PLAN: --The patient was seen with Dr. Mosetta Putt -Labs and f/u in 6 months -***observation vs octreotide injections    No orders of the defined types were placed in this encounter.    I spent {CHL ONC TIME VISIT - [id number]} counseling the patient face to face. The total time spent in the appointment was {CHL ONC TIME VISIT - BJYNW:[id number]}.   L , PA-C 12/05/22

## 2022-12-06 ENCOUNTER — Telehealth: Payer: Self-pay | Admitting: Physician Assistant

## 2022-12-06 NOTE — Telephone Encounter (Signed)
Patient had a death in the family and is unable to come to original time of appointment, patient is aware of rescheduled appointment times/dates

## 2022-12-12 ENCOUNTER — Other Ambulatory Visit: Payer: Medicare HMO

## 2022-12-12 ENCOUNTER — Ambulatory Visit: Payer: Medicare HMO | Admitting: Physician Assistant

## 2022-12-18 NOTE — Progress Notes (Signed)
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 Stranding in the ileal mesentery surrounding a low-density area in hindsight as seen on the prior study. This low-density area measures 19 x 17 mm. There is suggestion of some peripheral and nodular enhancement on coronal images. Findings are nonspecific and are of uncertain significance. Differential diagnosis would include Meckel's diverticulitis or cystic/necrotic lymph node or nodal metastasis in the ileal mesentery. Given nodular enhancement at the periphery underlying neoplasm would be difficult to exclude. Interloop abscess is felt less likely but given stranding not entirely excluded. Would also correlate with any signs of history of GI bleeding. Surgical consultation may be helpful. 2. No signs of pneumatosis or other secondary signs of bowel compromise. 3. Cholelithiasis without evidence of acute cholecystitis. 4. Trace free fluid in the pelvis, of uncertain significance. 5. Uterus with multiple leiomyomata, not well assessed on CT due to streak artifact from RIGHT hip arthroplasty. 6. Aortic atherosclerosis.   Aortic Atherosclerosis (ICD10-I70.0).     Electronically Signed   By: Donzetta Kohut M.D.   On: 06/07/2022 11:47   07/14/2022 Imaging   CT ENTERO ABD/PELVIS W CONTAST   IMPRESSION: 11 mm distal small bowel lesion, suspicious for small bowel carcinoid.   Associated ileal mesenteric nodes correspond to suspected mesenteric carcinoid.   Cholelithiasis, without associated inflammatory changes.   10/07/2022 Procedure   ROBOTIC ILEAL RESECTION by Dr. Michaell Cowing    10/07/2022 Initial Diagnosis    Well-differentiated neuroendocrine tumor (G1; low-grade) of the ileum    10/07/2022 Pathology Results   SURGICAL PATHOLOGY  CASE: [id number]   B. ILEUM MASS, RESECTION:  - Well-differentiated neuroendocrine tumor (G1; low-grade), 1.5 cm,  involving ileum  - Tumor involves the serosal surface  - Resection margins are negative for tumor  - Metastatic tumor to two of three lymph nodes (2/3)  - Mesenteric tumor deposit, 2.7 cm  - See oncology table    11/04/2022 Initial Diagnosis   Carcinoid tumor of ileum     CURRENT THERAPY: Observation   INTERVAL HISTORY: Christy Kirby 72 y.o. female returns to the clinic today for a follow-up visit accompanied by her husband.  The patient establish care with Dr. Mosetta Putt and myself on 11/04/2022.  At that point in time, the patient establish care due to newly diagnosed low-grade neuroendocrine tumor of the ileum. She had this resected on 10/07/2022. She was doing well at that time.  Dr. Mosetta Putt recommended a dotatate scan for staging.  If there is no evidence of metastatic disease or nodal metastases, Dr. Mosetta Putt recommended close monitoring every 6 months as she does have a risk of recurrence due to positive lymph nodes during her surgery.   She had a dotatate scan which showed a single focus (8 mm) of radiotracer activity in the ventral peritoneal space of the mid right abdomen consistent with somatostatin receptor positive mesenteric metastasis.  Discussed with Dr. Michaell Cowing who stated he be hesitant for resection of the nodule in the mesentery due to putting the small bowel blood supply at risk.  Therefore, Dr. Mosetta Putt recommended seeing her back in the clinic today to discuss observation versus somatostatin injections.  In the interval since last being seen,  she was hospitalized for SBO. She is feeling well at this time.   She also submitted her chromogranin A and 24-hour 5 HIAA sample which were within normal limits.  Overall she denies any concerning complaints  today.  Denies any fever, chills, or unexplained weight loss.  He has night sweats intermittently which has been going on for several years.  Denies any chest pain, shortness of breath, cough, or hemoptysis.  Denies any odynophagia or dysphagia.  Denies any significant abdominal pain. Denies any nausea, vomiting, diarrhea, or constipation.  Denies any flushing.  Denies any wheezing.  Denies any unusual back pain (she may have stable mild back pain if standing too long) or bone pain.  The patient states that her PCP took her off of her blood pressure medication as she only had hypertension if she was stressed.  Her blood pressure is high in the clinic today.  Denies any symptoms.  She is here today to review her scan and discuss her options.     MEDICAL HISTORY: Past Medical History:  Diagnosis Date   Arthritis    hip   GERD (gastroesophageal reflux disease)    Hyperlipidemia    Hypertension    Intractable nausea and vomiting 06/07/2022    ALLERGIES:  is allergic to codeine and nsaids.  MEDICATIONS:  Current Outpatient Medications  Medication Sig Dispense Refill   acetaminophen (TYLENOL) 325 MG tablet Take 2 tablets (650 mg total) by mouth every 6 (six) hours as needed for mild pain or headache. (Patient not taking: Reported on 12/19/2022)     metoprolol tartrate (LOPRESSOR) 25 MG tablet TAKE 1/2 TABLET TWICE A DAY BY MOUTH (Patient not taking: Reported on 12/19/2022) 90 tablet 3   ondansetron (ZOFRAN) 4 MG tablet Take 1 tablet (4 mg total) by mouth every 8 (eight) hours as needed for nausea or vomiting. (Patient not taking: Reported on 12/19/2022) 20 tablet 0   traMADol (ULTRAM) 50 MG tablet Take 1-2 tablets (50-100 mg total) by mouth every 6 (six) hours as needed for moderate pain or severe pain. (Patient not taking: Reported on 12/19/2022) 20 tablet 0   No current facility-administered medications for this visit.    SURGICAL HISTORY:  Past Surgical History:  Procedure Laterality Date    BIOPSY  06/08/2022   Procedure: BIOPSY;  Surgeon: Meridee Score Netty Starring., MD;  Location: [facility];  Service: Gastroenterology;;   COLONOSCOPY  06/26/2017   per Dr. Marina Goodell, benign polyps, repeat in 10 yrs    ESOPHAGOGASTRODUODENOSCOPY (EGD) WITH PROPOFOL N/A 06/08/2022   Procedure: ESOPHAGOGASTRODUODENOSCOPY (EGD) WITH PROPOFOL;  Surgeon: Lemar Lofty., MD;  Location: [facility];  Service: Gastroenterology;  Laterality: N/A;   NEUROPLASTY / TRANSPOSITION MEDIAN NERVE AT CARPAL TUNNEL BILATERAL     POLYPECTOMY     TOTAL HIP ARTHROPLASTY Right 01/11/2018   Procedure: RIGHT TOTAL HIP ARTHROPLASTY ANTERIOR APPROACH;  Surgeon: Samson Frederic, MD;  Location: [facility];  Service: Orthopedics;  Laterality: Right;   XI ROBOTIC ASSISTED SMALL BOWEL RESECTION N/A 10/07/2022   Procedure: XI ROBOTIC ASSISTED SMALL BOWEL RESECTION, BILATERAL TAP BLOCK, ASSESSMENT OF TISSUE PERFUSSION VIA FIREFLY INJECTION;  Surgeon: Karie Soda, MD;  Location: [facility];  Service: General;  Laterality: N/A;    REVIEW OF SYSTEMS:   Review of Systems  Constitutional: Negative for appetite change, chills, fatigue, fever and unexpected weight change.  HENT: Negative for mouth sores, nosebleeds, sore throat and trouble swallowing.   Eyes: Negative for eye problems and icterus.  Respiratory: Negative for cough, hemoptysis,  shortness of breath and wheezing.   Cardiovascular: Negative for chest pain and leg swelling.  Gastrointestinal: Negative for abdominal pain, constipation, diarrhea, nausea and vomiting.  Genitourinary: Negative for bladder incontinence, difficulty urinating, dysuria, frequency and hematuria.   Musculoskeletal: Negative for back pain, gait problem, neck pain and neck stiffness.  Skin: Negative for itching and rash.  Neurological: Negative for dizziness, extremity weakness, gait problem, headaches, light-headedness and seizures.  Hematological: Negative for adenopathy. Does not bruise/bleed easily.   Psychiatric/Behavioral: Negative for confusion, depression and sleep disturbance. The patient is not nervous/anxious.     PHYSICAL EXAMINATION:  Blood pressure (!) 172/100, pulse 81, temperature 97.9 F (36.6 C), temperature source Oral, resp. rate 15, weight 134 lb 8 oz (61 kg), SpO2 100%.  ECOG PERFORMANCE STATUS: 0  Physical Exam  Constitutional: Oriented to person, place, and time and well-developed, well-nourished, and in no distress.  HENT:  Head: Normocephalic and atraumatic.  Mouth/Throat: Oropharynx is clear and moist. No oropharyngeal exudate.  Eyes: Conjunctivae are normal. Right eye exhibits no discharge. Left eye exhibits no discharge. No scleral icterus.  Neck: Normal range of motion. Neck supple.  Cardiovascular: Normal rate, regular rhythm, normal heart sounds and intact distal pulses.   Pulmonary/Chest: Effort normal and breath sounds normal. No respiratory distress. No wheezes. No rales.  Abdominal: Soft. Bowel sounds are normal. Exhibits no distension and no mass. There is no tenderness.  Musculoskeletal: Normal range of motion. Exhibits no edema.  Lymphadenopathy:    No cervical adenopathy.  Neurological: Alert and oriented to person, place, and time. Exhibits normal muscle tone. Gait normal. Coordination normal.  Skin: Skin is warm and dry. No rash noted. Not diaphoretic. No erythema. No pallor.  Psychiatric: Mood, memory and judgment normal.  Vitals reviewed.  LABORATORY DATA: Lab Results  Component Value Date   WBC 3.7 (L) 11/10/2022   HGB 10.0 (L) 11/10/2022   HCT 30.8 (L) 11/10/2022   MCV 92.2 11/10/2022   PLT 202 11/10/2022      Chemistry      Component Value Date/Time   NA 138 11/11/2022 0520   K 4.4 11/11/2022 0520   CL 106 11/11/2022 0520   CO2 28 11/11/2022 0520   BUN 6 (L) 11/11/2022 0520   CREATININE 1.00 11/11/2022 0520   CREATININE 0.86 11/04/2022 1221      Component Value Date/Time   CALCIUM 8.9 11/11/2022 0520   ALKPHOS 42  11/08/2022 2059   AST 18 11/08/2022 2059   AST 12 (L) 11/04/2022 1221   ALT 10 11/08/2022 2059   ALT 6 11/04/2022 1221   BILITOT 1.0 11/08/2022 2059   BILITOT 0.4 11/04/2022 1221       RADIOGRAPHIC STUDIES:  NM PET DOTATATE SKULL BASE TO MID THIGH  Result Date: 11/27/2022 CLINICAL DATA:  Endocrine tumor of small bowel. Small-bowel resection 4.0 10/07/2022. EXAM: NUCLEAR MEDICINE PET SKULL BASE TO THIGH TECHNIQUE: 4.0 mCi copper 64 DOTATATE was injected intravenously. Full-ring PET imaging was performed from the skull base to thigh after the radiotracer. CT data was obtained and used for attenuation correction and anatomic localization. COMPARISON:  CT 11/09/2022, 07/14/2022 FINDINGS: NECK No radiotracer activity in neck lymph nodes. Incidental CT findings: None CHEST No radiotracer accumulation within mediastinal or hilar lymph nodes. No suspicious pulmonary nodules on the CT scan. Incidental CT finding:None ABDOMEN/PELVIS Postsurgical change consistent partial small bowel resection. Resection of the distal ileum. There is a focus of increased radiotracer in the ventral peritoneal space of the mid RIGHT abdomen just  off midline with SUV max equal 10.0 on image 130. There is a small mesenteric nodule measuring 8 mm (image 131/CT series 4) which co-registers with the radiotracer activity. No additional mesenteric radiotracer avid foci. No Focal radiotracer abnormality within the bowel above background physiologic activity. No radiotracer activity in the liver above background physiologic activity. Physiologic activity noted in the liver, spleen, adrenal glands and kidneys. Incidental CT findings:Uterus and adnexa unremarkable. SKELETON No focal activity to suggest skeletal metastasis. Incidental CT findings:No sclerotic or lytic lesions on CT imaging. RIGHT hip arthroplasty IMPRESSION: 1. Single focus of radiotracer activity in the ventral peritoneal space of the mid RIGHT abdomen is consistent with  somatostatin receptor positive mesenteric metastasis (well differentiated neuroendocrine tumor). 2. No evidence of bowel metastasis. Postsurgical change consistent with partial distal small bowel resection. 3. No evidence of liver metastasis. 4. No evidence of metastatic disease in the chest or skeleton. Electronically Signed   By: Genevive Bi M.D.   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 Treated for gastric -CT imaging on 06/07/2022 showed low-density area in the small bowel 19 x 17 mm.  -She followed up outpatient with Dr. Marina Goodell from GI.  -CT enterography on 07/14/2022 showed 11 mm distal small bowel lesion suspicious for small bowel carcinoid and associated ileal mesenteric nodes.  -Underwent ileal resection by Dr. Michaell Cowing on 10/07/2022.  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 Ki-67 1%. Also mesenteric tumor deposit measuring 2.7 cm was resected.  Staged as a T4, N1.  -Dotatate scan showed single focus (8 mm) of radiotracer activity in the ventral peritoneal space of the mid right abdomen.  -Discussed with Dr. Michaell Cowing who feels that surgery may put the small bowel blood supply at risk.  -Therefore, she was seen today with Dr. Mosetta Putt to discuss alternative options -Dr. Mosetta Putt discussed observation with close monitoring vs sandostatin injections vs observation. Since the patient asymptomatic and the area is small, Dr. Mosetta Putt favors close observation.  Discussed sandostatin can have side effects.  -The patient is in agreement with close monitoring.  Dr. Mosetta Putt recommends surveillance imaging annually.  -We will see her back with lab work in 54-month  and follow-up visit a week later to review the results. -Of course, the patient knows to call the clinic should she have worsening symptoms in the interval such as flushing, diarrhea, bloating, or abdominal pain.  Hypertension -her PCP d/ced her anti-hypertensive b/c her BP typically only elevates when she is stressed.  -Her BP is elevated today. She is nervous -Advised to monitor her BP at home and take a log of her readings. If continue to be persistently elevated, then would recommend following up with PCP to consider resuming antihypertensive.  -Of course, if she has headaches, vision changes, speech changes, weakness, etc, to seek emergency evaluation.     PLAN: -Arrange for labs and follow up in 6 months. Labs 1 week before office visit.  -Monitor BP at home, reach out to PCP if persistently elevated      Orders Placed This Encounter  Procedures   Chromogranin A    Standing Status:   Future    Standing Expiration Date:   12/19/2023   CBC with  Differential (Cancer Center Only)    Standing Status:   Future    Standing Expiration Date:   12/19/2023   CMP (Cancer Center only)    Standing Status:   Future    Standing Expiration Date:   12/19/2023      Christy Cadiente L Jestin Burbach, PA-C 12/19/22  Addendum I have seen the patient, examined her. I agree with the assessment and and plan and have edited the notes.   Patient is currently doing very well, asymptomatic.  I reviewed her dotatate PET scan images with her and her husband in detail.  She does have a positive mesentery lymph node, which is likely a metastatic lesion.  Dr. Michaell Cowing does not think he can offer surgery to remove it due to the location.  Patient is not symptomatic, with very low disease burden, I recommend observation, I do not think she needs octreotide injection at this point.  We discussed surveillance plan, plan to repeat CT scan in a year, we will see her back in 6 months with lab.  We discussed what to watch at  home, and she knows to contact us if she has any new concerns.  Patient voiced good understanding and agrees with the plan, all questions were answered.  Malachy Mood MD 12/19/2022

## 2022-12-19 ENCOUNTER — Inpatient Hospital Stay: Payer: Medicare HMO | Attending: Physician Assistant | Admitting: Physician Assistant

## 2022-12-19 ENCOUNTER — Other Ambulatory Visit: Payer: Self-pay

## 2022-12-19 ENCOUNTER — Inpatient Hospital Stay: Payer: Medicare HMO

## 2022-12-19 ENCOUNTER — Other Ambulatory Visit: Payer: Self-pay | Admitting: Physician Assistant

## 2022-12-19 VITALS — BP 172/100 | HR 81 | Temp 97.9°F | Resp 15 | Wt 134.5 lb

## 2022-12-19 DIAGNOSIS — C7A012 Malignant carcinoid tumor of the ileum: Secondary | ICD-10-CM | POA: Diagnosis not present

## 2022-12-19 DIAGNOSIS — I10 Essential (primary) hypertension: Secondary | ICD-10-CM | POA: Insufficient documentation

## 2023-02-22 DIAGNOSIS — Z124 Encounter for screening for malignant neoplasm of cervix: Secondary | ICD-10-CM | POA: Diagnosis not present

## 2023-02-22 DIAGNOSIS — Z1231 Encounter for screening mammogram for malignant neoplasm of breast: Secondary | ICD-10-CM | POA: Diagnosis not present

## 2023-02-22 DIAGNOSIS — Z6823 Body mass index (BMI) 23.0-23.9, adult: Secondary | ICD-10-CM | POA: Diagnosis not present

## 2023-02-22 LAB — HM PAP SMEAR

## 2023-02-22 LAB — HM MAMMOGRAPHY

## 2023-03-22 ENCOUNTER — Other Ambulatory Visit: Payer: Self-pay | Admitting: Family Medicine

## 2023-03-22 DIAGNOSIS — M1611 Unilateral primary osteoarthritis, right hip: Secondary | ICD-10-CM

## 2023-04-27 ENCOUNTER — Ambulatory Visit: Payer: Medicare HMO | Admitting: Family Medicine

## 2023-04-27 ENCOUNTER — Encounter: Payer: Self-pay | Admitting: Family Medicine

## 2023-04-27 DIAGNOSIS — Z0000 Encounter for general adult medical examination without abnormal findings: Secondary | ICD-10-CM | POA: Diagnosis not present

## 2023-04-27 NOTE — Progress Notes (Signed)
Patient unable to take vital signs due to telehealth visit

## 2023-04-27 NOTE — Progress Notes (Signed)
PATIENT CHECK-IN and HEALTH RISK ASSESSMENT QUESTIONNAIRE:  -completed by phone/video for upcoming Medicare Preventive Visit  Pre-Visit Check-in: 1)Vitals (height, wt, BP, etc) - record in vitals section for visit on day of visit Request home vitals (wt, BP, etc.) and enter into vitals, THEN update Vital Signs SmartPhrase below at the top of the HPI. See below.  2)Review and Update Medications, Allergies PMH, Surgeries, Social history in Epic 3)Hospitalizations in the last year with date/reason?Yes GI surgery   4)Review and Update Care Team (patient's specialists) in Epic 5) Complete PHQ9 in Epic  6) Complete Fall Screening in Epic 7)Review all Health Maintenance Due and order under PCP if not done.  8)Medicare Wellness Questionnaire: Answer theses question about your habits: Do you drink alcohol? No If yes, how many drinks do you have a day?N/A Have you ever smoked?No Quit date if applicable? N/A  How many packs a day do/did you smoke? N/A Do you use smokeless tobacco?No Do you use an illicit drugs?No Do you exercises? No, IF so, what type and how many days/minutes per week?N/A  Are you sexually active? No Number of partners?N/A She feels diet is healthy , tries to avoid fried foods and greasy food Typical breakfast: Varies  Typical lunch:Varies  Typical dinner: Varies  Typical snacks: Fruit  Beverages: Water  Answer theses question about you: Can you perform most household chores?Yes Do you find it hard to follow a conversation in a noisy room?No Do you often ask people to speak up or repeat themselves? No Do you feel that you have a problem with memory? No Do you balance your checkbook and or bank acounts? Yes Do you feel safe at home?Yes Last dentist visit? Unknown  Do you need assistance with any of the following: Please note if so No  Driving?  Feeding yourself?  Getting from bed to chair?  Getting to the toilet?  Bathing or showering?  Dressing yourself?  Managing  money?  Climbing a flight of stairs  Preparing meals?  Do you have Advanced Directives in place (Living Will, Healthcare Power or Attorney)? No   Last eye Exam and location? Unknown    Do you currently use prescribed or non-prescribed narcotic or opioid pain medications? No  Do you have a history or close family history of breast, ovarian, tubal or peritoneal cancer or a family member with BRCA (breast cancer susceptibility 1 and 2) gene mutations?   Request home vitals (wt, BP, etc.) and enter into vitals, THEN update Vital Signs SmartPhrase below at the top of the HPI. See below.   Nurse/Assistant Credentials/time stamp: MG 11:47 am    ----------------------------------------------------------------------------------------------------------------------------------------------------------------------------------------------------------------------  Because this visit was a virtual/telehealth visit, some criteria may be missing or patient reported. Any vitals not documented were not able to be obtained and vitals that have been documented are patient reported.    MEDICARE ANNUAL PREVENTIVE VISIT WITH PROVIDER: (Welcome to Medicare, initial annual wellness or annual wellness exam)  Virtual Visit via Video Note  I connected with Christy Kirby on 04/27/23 by a video enabled telemedicine application and verified that I am speaking with the correct person using two identifiers.  Location patient: home Location provider:work or home office Persons participating in the virtual visit: patient, provider  Concerns and/or follow up today: reports all is stable for the most part. Has been a little stressed as daughter had a stroke and is in a facility - this has impacted her sleep. She reports blood pressure 150-170s SBP, sometimes in the  180s, usually over 70-80s. Thinks is due to talking whenever checks it. Takes 1/2 tablet of metoprolol. Denies CP, SOB, HA, or any symptoms.    See HM  section in Epic for other details of completed HM.    ROS: negative for report of fevers, unintentional weight loss, vision changes, vision loss, hearing loss or change, chest pain, sob, hemoptysis, melena, hematochezia, hematuria, falls, bleeding or bruising, thoughts of suicide or self harm, memory loss  Patient-completed extensive health risk assessment - reviewed and discussed with the patient: See Health Risk Assessment completed with patient prior to the visit either above or in recent phone note. This was reviewed in detailed with the patient today and appropriate recommendations, orders and referrals were placed as needed per Summary below and patient instructions.   Review of Medical History: -PMH, PSH, Family History and current specialty and care providers reviewed and updated and listed below   Patient Care Team: Nelwyn Salisbury, MD as PCP - Erline Hau, MD as Consulting Physician (General Surgery) Hilarie Fredrickson, MD as Consulting Physician (Gastroenterology) Malachy Mood, MD as Consulting Physician (Oncology)   Past Medical History:  Diagnosis Date   Arthritis    hip   GERD (gastroesophageal reflux disease)    Hyperlipidemia    Hypertension    Intractable nausea and vomiting 06/07/2022    Past Surgical History:  Procedure Laterality Date   BIOPSY  06/08/2022   Procedure: BIOPSY;  Surgeon: Lemar Lofty., MD;  Location: [facility];  Service: Gastroenterology;;   COLONOSCOPY  06/26/2017   per Dr. Marina Goodell, benign polyps, repeat in 10 yrs    ESOPHAGOGASTRODUODENOSCOPY (EGD) WITH PROPOFOL N/A 06/08/2022   Procedure: ESOPHAGOGASTRODUODENOSCOPY (EGD) WITH PROPOFOL;  Surgeon: Lemar Lofty., MD;  Location: [facility];  Service: Gastroenterology;  Laterality: N/A;   NEUROPLASTY / TRANSPOSITION MEDIAN NERVE AT CARPAL TUNNEL BILATERAL     POLYPECTOMY     TOTAL HIP ARTHROPLASTY Right 01/11/2018   Procedure: RIGHT TOTAL HIP ARTHROPLASTY ANTERIOR APPROACH;   Surgeon: Samson Frederic, MD;  Location: [facility];  Service: Orthopedics;  Laterality: Right;   XI ROBOTIC ASSISTED SMALL BOWEL RESECTION N/A 10/07/2022   Procedure: XI ROBOTIC ASSISTED SMALL BOWEL RESECTION, BILATERAL TAP BLOCK, ASSESSMENT OF TISSUE PERFUSSION VIA FIREFLY INJECTION;  Surgeon: Karie Soda, MD;  Location: [facility];  Service: General;  Laterality: N/A;    Social History   Socioeconomic History   Marital status: Legally Separated    Spouse name: Not on file   Number of children: Not on file   Years of education: Not on file   Highest education level: Not on file  Occupational History   Not on file  Tobacco Use   Smoking status: Former    Types: Cigarettes   Smokeless tobacco: Never   Tobacco comments:    only when [age] years old briefly   Vaping Use   Vaping status: Never Used  Substance and Sexual Activity   Alcohol use: Not Currently    Comment: occ wine    Drug use: No   Sexual activity: Not Currently  Other Topics Concern   Not on file  Social History Narrative   Not on file   Social Determinants of Health   Financial Resource Strain: Low Risk  (04/27/2023)   Overall Financial Resource Strain (CARDIA)    Difficulty of Paying Living Expenses: Not very hard  Food Insecurity: No Food Insecurity (11/14/2022)   Hunger Vital Sign    Worried About Running Out of Food  in the Last Year: Never true    Ran Out of Food in the Last Year: Never true  Transportation Needs: No Transportation Needs (11/14/2022)   PRAPARE - Administrator, Civil Service (Medical): No    Lack of Transportation (Non-Medical): No  Physical Activity: Inactive (04/27/2023)   Exercise Vital Sign    Days of Exercise per Week: 0 days    Minutes of Exercise per Session: 0 min  Stress: No Stress Concern Present (04/27/2023)   [employer] of Occupational Health - Occupational Stress Questionnaire    Feeling of Stress : Not at all  Social Connections: Moderately Isolated  (04/27/2023)   Social Connection and Isolation Panel [NHANES]    Frequency of Communication with Friends and Family: More than three times a week    Frequency of Social Gatherings with Friends and Family: More than three times a week    Attends Religious Services: More than 4 times per year    Active Member of [employer] or Organizations: No    Attends Banker Meetings: Never    Marital Status: Separated  Intimate Partner Violence: Not At Risk (11/09/2022)   Humiliation, Afraid, Rape, and Kick questionnaire    Fear of Current or Ex-Partner: No    Emotionally Abused: No    Physically Abused: No    Sexually Abused: No    Family History  Problem Relation Age of Onset   Diabetes Sister    Colon cancer Neg Hx    Colon polyps Neg Hx    Esophageal cancer Neg Hx    Rectal cancer Neg Hx    Stomach cancer Neg Hx     Current Outpatient Medications on File Prior to Visit  Medication Sig Dispense Refill   metoprolol tartrate (LOPRESSOR) 25 MG tablet TAKE 1/2 TABLET TWICE A DAY BY MOUTH 90 tablet 3   acetaminophen (TYLENOL) 325 MG tablet Take 2 tablets (650 mg total) by mouth every 6 (six) hours as needed for mild pain or headache. (Patient not taking: Reported on 12/19/2022)     No current facility-administered medications on file prior to visit.    Allergies  Allergen Reactions   Codeine Nausea Only   Nsaids Other (See Comments)    History of gastritis = minimize NSAIDs       Physical Exam Vitals requested from patient and listed below if patient had equipment and was able to obtain at home for this virtual visit: There were no vitals filed for this visit. Estimated body mass index is 22.73 kg/m as calculated from the following:   Height as of 11/09/22: 5' 4.5" (1.638 m).   Weight as of 12/19/22: 134 lb 8 oz (61 kg).  EKG (optional): deferred due to virtual visit  GENERAL: alert, oriented, no acute distress detected, full vision exam deferred due to pandemic and/or virtual  encounter  HEENT: atraumatic, conjunttiva clear, no obvious abnormalities on inspection of external nose and ears  NECK: normal movements of the head and neck  LUNGS: on inspection no signs of respiratory distress, breathing rate appears normal, no obvious gross SOB, gasping or wheezing  CV: no obvious cyanosis  MS: moves all visible extremities without noticeable abnormality  PSYCH/NEURO: pleasant and cooperative, no obvious depression or anxiety, speech and thought processing grossly intact, Cognitive function grossly intact  [employer] Office Visit from 04/27/2023 in [facility] [place]  PHQ-9 Total Score 1           04/27/2023  11:24 AM 10/27/2022   11:33 AM 04/05/2022   10:51 AM 03/09/2021    1:54 PM 09/30/2020    8:40 AM  Depression screen PHQ 2/9  Decreased Interest 0 0 0 0 1  Down, Depressed, Hopeless 0 0 0 0 1  PHQ - 2 Score 0 0 0 0 2  Altered sleeping 1 0   0  Tired, decreased energy 0 0   0  Change in appetite 0 0   0  Feeling bad or failure about yourself  0 0   0  Trouble concentrating 0 0   0  Moving slowly or fidgety/restless 0 0   0  Suicidal thoughts 0 0   0  PHQ-9 Score 1 0   2  Difficult doing work/chores Not difficult at all Not difficult at all   Not difficult at all       06/09/2022   11:00 PM 06/10/2022    7:15 AM 06/24/2022   10:54 AM 10/27/2022   11:32 AM 04/27/2023   11:26 AM  Fall Risk  Falls in the past year?   0 0 0  Was there an injury with Fall?   0 0 0  Fall Risk Category Calculator   0 0 0  (RETIRED) Patient Fall Risk Level Moderate fall risk Moderate fall risk     Patient at Risk for Falls Due to   No Fall Risks No Fall Risks No Fall Risks  Fall risk Follow up   Falls evaluation completed Falls evaluation completed Falls evaluation completed     SUMMARY AND PLAN:  Encounter for Medicare annual wellness exam    Discussed applicable health maintenance/preventive health measures and advised and  referred or ordered per patient preferences: - sees Dr. Rana Snare for her mammograms and bone density test, reports done last month, asked staff to request copy for PCP -discussed vaccines due and she plans to get at the pharmacy Health Maintenance  Topic Date Due   Hepatitis C Screening  Never done   DTaP/Tdap/Td (1 - Tdap) Never done   Zoster Vaccines- Shingrix (1 of 2) Never done   MAMMOGRAM  Never done   DEXA SCAN  Never done   COVID-19 Vaccine (4 - 2023-24 season) 02/05/2023   INFLUENZA VACCINE  09/04/2023 (Originally 01/05/2023)   Medicare Annual Wellness (AWV)  04/26/2024   Colonoscopy  06/27/2027   Pneumonia Vaccine 34+ Years old  Completed   HPV VACCINES  Aged Out     Education and counseling on the following was provided based on the above review of health and a plan/checklist for the patient, along with additional information discussed, was provided for the patient in the patient instructions :  -Advised on importance of completing advanced directives, discussed options for completing and provided information in patient instructions as well -on roc BP has been up in the past at office visits. Discussed normal BP goals, risks of elevated BP, how to check properly (not talking while checking, etc.) and recommended in office visit as soon as possible - advised to bring cuff. Sent message to staff to schedule inperson visit and she agrees to call office in the morning if someone does not contact her today. Advised appt asap in the next 1 week. In interim discussed low sodium, healthy whole foods diet, avoidance of ultraprocessed foods, sleep and exercise (once evaluated in office.) Her pulse was in the upper 70s. Advised could take 1 tablet of her BP med instead of one half if continues to  run high until seen.  -discussed sleep hygiene and provided icbt for sleep. Discussed journaling to do list, setting calming bedtime routine, setting sleep and wake times, no screens or bright lights 90  minutes before bed, managing stress, red light night lights, etc.  -Provided counseling and plan for difficulty hearing  -Provided counseling and plan for increased risk of falling if applicable per above screening. Reviewed and demonstrated safe balance exercises that can be done at home to improve balance and discussed exercise guidelines for adults with include balance exercises at least 3 days per week.  -Advised and counseled on a healthy lifestyle - including the importance of a healthy diet, regular physical activity, social connections and stress management. -Reviewed patient's current diet. Advised and counseled on a whole foods based healthy diet. A summary of a healthy diet was provided in the Patient Instructions.  -reviewed patient's current physical activity level and discussed exercise guidelines for adults. Discussed community resources and ideas for safe exercise at home to assist in meeting exercise guideline recommendations in a safe and healthy way.  -Advise yearly dental visits at minimum and regular eye exams   Follow up: see patient instructions     Patient Instructions  I really enjoyed getting to talk with you today! I am available on Tuesdays and Thursdays for virtual visits if you have any questions or concerns, or if I can be of any further assistance.   CHECKLIST FROM ANNUAL WELLNESS VISIT:  -Follow up (please call to schedule if not scheduled after visit):   -yearly for annual wellness visit with primary care office  -schedule in office visit asap in the next one week for elevated blood pressure bring cuff, seek inperson care immediately at the urgent care or hospital if chest pain, headache or any severe or concerning symptoms or if BP is going up higher.   Here is a list of your preventive care/health maintenance measures and the plan for each if any are due:  PLAN For any measures below that may be due:  -can get the vaccines at the pharmacy, please let us  know when you do so that you can update   Health Maintenance  Topic Date Due   Hepatitis C Screening  Never done   DTaP/Tdap/Td (1 - Tdap) Never done   Zoster Vaccines- Shingrix (1 of 2) Never done   MAMMOGRAM  Never done   DEXA SCAN  Never done   INFLUENZA VACCINE  Never done   COVID-19 Vaccine (4 - 2023-24 season) 02/05/2023   Medicare Annual Wellness (AWV)  04/26/2024   Colonoscopy  06/27/2027   Pneumonia Vaccine 61+ Years old  Completed   HPV VACCINES  Aged Out    -See a dentist at least yearly  -Get your eyes checked and then per your eye specialist's recommendations     -I have included below further information regarding a healthy whole foods based diet, physical activity guidelines for adults, stress management and opportunities for social connections. I hope you find this information useful.   -----------------------------------------------------------------------------------------------------------------------------------------------------------------------------------------------------------------------------------------------------------  NUTRITION: -eat real food: lots of colorful vegetables (half the plate) and fruits -5-7 servings of vegetables and fruits per day (fresh or steamed is best), exp. 2 servings of vegetables with lunch and dinner and 2 servings of fruit per day. Berries and greens such as kale and collards are great choices.  -consume on a regular basis: whole grains (make sure first ingredient on label contains the word "whole"), fresh fruits, fish, nuts, seeds, healthy  oils (such as olive oil, avocado oil, grape seed oil) -may eat small amounts of dairy and lean meat on occasion, but avoid processed meats such as ham, bacon, lunch meat, etc. -drink water -try to avoid fast food and pre-packaged foods, processed meat -most experts advise limiting sodium to < 2300mg  per day, should limit further is any chronic conditions such as high blood pressure,  heart disease, diabetes, etc. The American Heart Association advised that < 1500mg  is is ideal -try to avoid foods that contain any ingredients with names you do not recognize  -try to avoid sugar/sweets (except for the natural sugar that occurs in fresh fruit) -try to avoid sweet drinks -try to avoid white rice, white bread, pasta (unless whole grain), white or yellow potatoes  EXERCISE GUIDELINES FOR ADULTS: -if you wish to increase your physical activity, do so gradually and with the approval of your doctor -STOP and seek medical care immediately if you have any chest pain, chest discomfort or trouble breathing when starting or increasing exercise  -move and stretch your body, legs, feet and arms when sitting for long periods -Physical activity guidelines for optimal health in adults: -least 150 minutes per week of aerobic exercise (can talk, but not sing) once approved by your doctor, 20-30 minutes of sustained activity or two 10 minute episodes of sustained activity every day.  -resistance training at least 2 days per week if approved by your doctor -balance exercises 3+ days per week:   Stand somewhere where you have something sturdy to hold onto if you lose balance.    1) lift up on toes, start with 5x per day and work up to 20x   2) stand and lift on leg straight out to the side so that foot is a few inches of the floor, start with 5x each side and work up to 20x each side   3) stand on one foot, start with 5 seconds each side and work up to 20 seconds on each side  If you need ideas or help with getting more active:  -Silver sneakers https://tools.silversneakers.com  -Walk with a Doc: http://www.duncan-williams.com/  -try to include resistance (weight lifting/strength building) and balance exercises twice per week: or the following link for  ideas: http://castillo-powell.com/  BuyDucts.dk  STRESS MANAGEMENT: -can try meditating, or just sitting quietly with deep breathing while intentionally relaxing all parts of your body for 5 minutes daily -if you need further help with stress, anxiety or depression please follow up with your primary doctor or contact the wonderful folks at [facility]: [phone]  SOCIAL CONNECTIONS: -options in [place] if you wish to engage in more social and exercise related activities:  -Silver sneakers https://tools.silversneakers.com  -Walk with a Doc: http://www.duncan-williams.com/  -Check out the [facility] Active Adults 50+ section on the [place] [employer] (hiking clubs, book clubs, cards and games, chess, exercise classes, aquatic classes and much more) - see the website for details: https://www.[place].gov/departments/parks-recreation/active-adults50  -YouTube has lots of exercise videos for different ages and abilities as well  -[facility] (a variety of indoor and outdoor inperson activities for adults). (210)167-7673. [address]  -Virtual Online Classes (a variety of topics): see seniorplanet.org or call ([phone]  -consider volunteering at a school, hospice center, church, senior center or elsewhere  ADVANCED HEALTHCARE DIRECTIVES:  [place] Advanced Directives assistance:   ExpressWeek.com.cy  Everyone should have advanced health care directives in place. This is so that you get the care you want, should you ever be  in a situation where you are unable to make your own medical decisions.   From the [place] Advanced Directive Website: "Advance Health Care Directives are legal documents in which you give written instructions about your health care if, in the future, you cannot speak for yourself.    A health care power of attorney allows you to name a person you trust to make your health care decisions if you cannot make them yourself. A declaration of a desire for a natural death (or living will) is document, which states that you desire not to have your life prolonged by extraordinary measures if you have a terminal or incurable illness or if you are in a vegetative state. An advance instruction for mental health treatment makes a declaration of instructions, information and preferences regarding your mental health treatment. It also states that you are aware that the advance instruction authorizes a mental health treatment provider to act according to your wishes. It may also outline your consent or refusal of mental health treatment. A declaration of an anatomical gift allows anyone over the age of 3 to make a gift by will, organ donor card or other document."   Please see the following website or an elder law attorney for forms, FAQs and for completion of advanced directives: [place] TEFL teacher Health Care Directives Advance Health Care Directives (http://guzman.com/)  Or copy and paste the following to your web browser: PoshChat.fi           Terressa Koyanagi, DO

## 2023-04-27 NOTE — Patient Instructions (Addendum)
I really enjoyed getting to talk with you today! I am available on Tuesdays and Thursdays for virtual visits if you have any questions or concerns, or if I can be of any further assistance.   CHECKLIST FROM ANNUAL WELLNESS VISIT:  -Follow up (please call to schedule if not scheduled after visit):   -yearly for annual wellness visit with primary care office  -schedule in office visit asap in the next one week for elevated blood pressure bring cuff, seek inperson care immediately at the urgent care or hospital if chest pain, headache or any severe or concerning symptoms or if BP is going up higher.   Here is a list of your preventive care/health maintenance measures and the plan for each if any are due:  PLAN For any measures below that may be due:  -can get the vaccines at the pharmacy, please let us know when you do so that you can update   Health Maintenance  Topic Date Due   Hepatitis C Screening  Never done   DTaP/Tdap/Td (1 - Tdap) Never done   Zoster Vaccines- Shingrix (1 of 2) Never done   MAMMOGRAM  Never done   DEXA SCAN  Never done   INFLUENZA VACCINE  Never done   COVID-19 Vaccine (4 - 2023-24 season) 02/05/2023   Medicare Annual Wellness (AWV)  04/26/2024   Colonoscopy  06/27/2027   Pneumonia Vaccine 41+ Years old  Completed   HPV VACCINES  Aged Out    -See a dentist at least yearly  -Get your eyes checked and then per your eye specialist's recommendations     -I have included below further information regarding a healthy whole foods based diet, physical activity guidelines for adults, stress management and opportunities for social connections. I hope you find this information useful.   -----------------------------------------------------------------------------------------------------------------------------------------------------------------------------------------------------------------------------------------------------------  NUTRITION: -eat real food:  lots of colorful vegetables (half the plate) and fruits -5-7 servings of vegetables and fruits per day (fresh or steamed is best), exp. 2 servings of vegetables with lunch and dinner and 2 servings of fruit per day. Berries and greens such as kale and collards are great choices.  -consume on a regular basis: whole grains (make sure first ingredient on label contains the word "whole"), fresh fruits, fish, nuts, seeds, healthy oils (such as olive oil, avocado oil, grape seed oil) -may eat small amounts of dairy and lean meat on occasion, but avoid processed meats such as ham, bacon, lunch meat, etc. -drink water -try to avoid fast food and pre-packaged foods, processed meat -most experts advise limiting sodium to < 2300mg  per day, should limit further is any chronic conditions such as high blood pressure, heart disease, diabetes, etc. The American Heart Association advised that < 1500mg  is is ideal -try to avoid foods that contain any ingredients with names you do not recognize  -try to avoid sugar/sweets (except for the natural sugar that occurs in fresh fruit) -try to avoid sweet drinks -try to avoid white rice, white bread, pasta (unless whole grain), white or yellow potatoes  EXERCISE GUIDELINES FOR ADULTS: -if you wish to increase your physical activity, do so gradually and with the approval of your doctor -STOP and seek medical care immediately if you have any chest pain, chest discomfort or trouble breathing when starting or increasing exercise  -move and stretch your body, legs, feet and arms when sitting for long periods -Physical activity guidelines for optimal health in adults: -least 150 minutes per week of aerobic exercise (can talk, but  not sing) once approved by your doctor, 20-30 minutes of sustained activity or two 10 minute episodes of sustained activity every day.  -resistance training at least 2 days per week if approved by your doctor -balance exercises 3+ days per  week:   Stand somewhere where you have something sturdy to hold onto if you lose balance.    1) lift up on toes, start with 5x per day and work up to 20x   2) stand and lift on leg straight out to the side so that foot is a few inches of the floor, start with 5x each side and work up to 20x each side   3) stand on one foot, start with 5 seconds each side and work up to 20 seconds on each side  If you need ideas or help with getting more active:  -Silver sneakers https://tools.silversneakers.com  -Walk with a Doc: http://www.duncan-williams.com/  -try to include resistance (weight lifting/strength building) and balance exercises twice per week: or the following link for ideas: http://castillo-powell.com/  BuyDucts.dk  STRESS MANAGEMENT: -can try meditating, or just sitting quietly with deep breathing while intentionally relaxing all parts of your body for 5 minutes daily -if you need further help with stress, anxiety or depression please follow up with your primary doctor or contact the wonderful folks at [facility]: [phone]  SOCIAL CONNECTIONS: -options in [place] if you wish to engage in more social and exercise related activities:  -Silver sneakers https://tools.silversneakers.com  -Walk with a Doc: http://www.duncan-williams.com/  -Check out the [facility] Active Adults 50+ section on the [place] [employer] (hiking clubs, book clubs, cards and games, chess, exercise classes, aquatic classes and much more) - see the website for details: https://www.[place].gov/departments/parks-recreation/active-adults50  -YouTube has lots of exercise videos for different ages and abilities as well  -[facility] (a variety of indoor and outdoor inperson activities for adults). [phone]. [address].  -Virtual Online Classes (a variety of topics): see  seniorplanet.org or call 256-539-2968  -consider volunteering at a school, hospice center, church, senior center or elsewhere  ADVANCED HEALTHCARE DIRECTIVES:  [place] Advanced Directives assistance:   ExpressWeek.com.cy  Everyone should have advanced health care directives in place. This is so that you get the care you want, should you ever be in a situation where you are unable to make your own medical decisions.   From the [place] Advanced Directive Website: "Advance Health Care Directives are legal documents in which you give written instructions about your health care if, in the future, you cannot speak for yourself.   A health care power of attorney allows you to name a person you trust to make your health care decisions if you cannot make them yourself. A declaration of a desire for a natural death (or living will) is document, which states that you desire not to have your life prolonged by extraordinary measures if you have a terminal or incurable illness or if you are in a vegetative state. An advance instruction for mental health treatment makes a declaration of instructions, information and preferences regarding your mental health treatment. It also states that you are aware that the advance instruction authorizes a mental health treatment provider to act according to your wishes. It may also outline your consent or refusal of mental health treatment. A declaration of an anatomical gift allows anyone over the age of 1 to make a gift by will, organ donor card or other document."   Please see the following website or an elder  law attorney for forms, FAQs and for completion of advanced directives: Secretary/administrator Health Care Directives Advance Health Care Directives (http://guzman.com/)  Or copy and paste the following to your web browser: PoshChat.fi

## 2023-05-02 ENCOUNTER — Ambulatory Visit (INDEPENDENT_AMBULATORY_CARE_PROVIDER_SITE_OTHER): Payer: Medicare HMO | Admitting: Family Medicine

## 2023-05-02 ENCOUNTER — Encounter: Payer: Self-pay | Admitting: Family Medicine

## 2023-05-02 VITALS — BP 126/78 | HR 82 | Temp 97.8°F | Wt 141.0 lb

## 2023-05-02 DIAGNOSIS — M25571 Pain in right ankle and joints of right foot: Secondary | ICD-10-CM

## 2023-05-02 DIAGNOSIS — I10 Essential (primary) hypertension: Secondary | ICD-10-CM

## 2023-05-02 DIAGNOSIS — G8929 Other chronic pain: Secondary | ICD-10-CM | POA: Diagnosis not present

## 2023-05-02 MED ORDER — METOPROLOL TARTRATE 25 MG PO TABS: 25.0000 mg | ORAL_TABLET | Freq: Two times a day (BID) | ORAL | Status: DC

## 2023-05-02 MED ORDER — IBUPROFEN 800 MG PO TABS: 800.0000 mg | ORAL_TABLET | Freq: Three times a day (TID) | ORAL | 5 refills | Status: DC | PRN

## 2023-05-02 NOTE — Progress Notes (Signed)
   Subjective:    Patient ID: Christy Kirby, female    DOB: 1951/05/01, 72 y.o.   MRN: [id number]  HPI Here for several issues. First she has had some high BP readings at home, often getting systolic readings of 150's to 027'O. No headache or SOB. Also she has had intermittent pains in the right ankle foro the past 6 weeks. No hx of trauma. No swelling. Ibuprofen helps.    Review of Systems  Constitutional: Negative.   Respiratory: Negative.    Cardiovascular: Negative.   Musculoskeletal:  Positive for arthralgias.  Neurological: Negative.        Objective:   Physical Exam Constitutional:      Appearance: Normal appearance.  Cardiovascular:     Rate and Rhythm: Normal rate and regular rhythm.     Pulses: Normal pulses.     Heart sounds: Normal heart sounds.  Pulmonary:     Effort: Pulmonary effort is normal.     Breath sounds: Normal breath sounds.  Musculoskeletal:     Comments: Right ankle appears normal. She is tender medial to and inferior to the lateral malleolus. ROM is limited by pain.   Neurological:     Mental Status: She is alert.           Assessment & Plan:  For the labile HTN, she will increase the Metoprolol tartrate to 25 mg a whole tablet BID.refer to Sports Medicine for the ankle pain.  Gershon Crane, MD

## 2023-05-08 ENCOUNTER — Ambulatory Visit: Payer: Medicare HMO | Admitting: Hematology

## 2023-05-08 ENCOUNTER — Other Ambulatory Visit: Payer: Medicare HMO

## 2023-05-16 ENCOUNTER — Ambulatory Visit: Payer: Medicare HMO | Admitting: Family Medicine

## 2023-05-16 ENCOUNTER — Other Ambulatory Visit: Payer: Self-pay

## 2023-05-16 ENCOUNTER — Ambulatory Visit (INDEPENDENT_AMBULATORY_CARE_PROVIDER_SITE_OTHER): Payer: Medicare HMO

## 2023-05-16 VITALS — BP 132/76 | HR 82 | Ht 64.5 in | Wt 143.0 lb

## 2023-05-16 DIAGNOSIS — I10 Essential (primary) hypertension: Secondary | ICD-10-CM | POA: Diagnosis not present

## 2023-05-16 DIAGNOSIS — M25571 Pain in right ankle and joints of right foot: Secondary | ICD-10-CM | POA: Diagnosis not present

## 2023-05-16 DIAGNOSIS — K219 Gastro-esophageal reflux disease without esophagitis: Secondary | ICD-10-CM | POA: Diagnosis not present

## 2023-05-16 DIAGNOSIS — G8929 Other chronic pain: Secondary | ICD-10-CM | POA: Diagnosis not present

## 2023-05-16 NOTE — Patient Instructions (Addendum)
Thank you for coming in today.   Please get an Xray today before you leave   Please use Voltaren gel (Generic Diclofenac Gel) up to 4x daily for pain as needed.  This is available over-the-counter as both the name brand Voltaren gel and the generic diclofenac gel.   I've referred you to Physical Therapy.  Let us know if you don't hear from them in one week.   Check back in 6 weeks

## 2023-05-16 NOTE — Progress Notes (Signed)
   Rubin Payor, PhD, LAT, ATC acting as a scribe for Clementeen Graham, MD.  Christy Kirby is a 72 y.o. female who presents to [facility] today for R ankle pain ongoing for a couple months. No injury. Pt locates pain to the anterior aspect of her R ankle along the talocrural joint.   Swelling: no Aggravates: DF, worse in the morning, prolonged sitting w/ transitioning to stand Treatments tried: none  Pertinent review of systems: No fevers or chills  Relevant historical information: Malignant carcinoid tumor of the gut.   Exam:  BP 132/76   Pulse 82   Ht 5' 4.5" (1.638 m)   Wt 143 lb (64.9 kg)   SpO2 97%   BMI 24.17 kg/m  General: Well Developed, well nourished, and in no acute distress.   MSK: Right ankle normal-appearing Tender palpation anterior lateral ankle.  Some pain present with resisted foot dorsiflexion and passive foot dorsiflexion located in the anterior ankle. Stable ligamentous exam. Intact strength. Pulses cap refill and sensation are intact distally.    Lab and Radiology Results  Diagnostic Limited MSK Ultrasound of: Right ankle Anterior ankle visualized.  Mild joint effusion. Peroneal tertius tendon is present with mild hypoechoic fluid surround the tendon sheath indicating tenosynovitis.  Other anterior ankle tendons are normal-appearing. Impression: Mild effusion and peroneal tertius tenosynovitis  X-ray images right ankle obtained today personally and independently interpreted Subchondral cyst formation present at the medial talar corner indicating possible osteochondral disease.  Mild degenerative changes otherwise.  No acute fractures are present. Await formal radiology review   Assessment and Plan: 72 y.o. female with right anterior ankle pain.  Ultrasound examination is concerning for peroneus tertius tenosynovitis and we did embark on a home exercise program for that along with Voltaren gel and physical therapy.   However x-ray does show images concerning for osteochondral lesions.  If we need to clarify an MRI would be helpful here.  Check back in 6 weeks. Avoid oral NSAIDs secondary to hypertension and GERD and her carcinoid tumor.  PDMP not reviewed this encounter. Orders Placed This Encounter  Procedures   DG Ankle Complete Right    Standing Status:   Future    Number of Occurrences:   1    Standing Expiration Date:   05/15/2024    Order Specific Question:   Reason for Exam (SYMPTOM  OR DIAGNOSIS REQUIRED)    Answer:   right ankle pain    Order Specific Question:   Preferred imaging location?    Answer:   [facility]   [place] LIMITED JOINT SPACE STRUCTURES LOW RIGHT(NO LINKED CHARGES)    Order Specific Question:   Reason for Exam (SYMPTOM  OR DIAGNOSIS REQUIRED)    Answer:   right ankle pain    Order Specific Question:   Preferred imaging location?    Answer:   [facility] referral to Physical Therapy    Referral Priority:   Routine    Referral Type:   Physical Medicine    Referral Reason:   Specialty Services Required    Requested Specialty:   Physical Therapy    Number of Visits Requested:   1   No orders of the defined types were placed in this encounter.    Discussed warning signs or symptoms. Please see discharge instructions. Patient expresses understanding.   The above documentation has been reviewed and is accurate and complete Clementeen Graham, M.D.

## 2023-06-05 NOTE — Progress Notes (Signed)
Right ankle x-ray shows concern for an osteochondral lesion.  This is an area where a chunk of cartilage gets knocked off the surface of the ankle.  An MRI would be more accurate.  I can order an MRI now if you are not feeling any better.  How are you feeling?

## 2023-06-06 ENCOUNTER — Other Ambulatory Visit: Payer: Self-pay

## 2023-06-06 ENCOUNTER — Encounter: Payer: Self-pay | Admitting: Physical Therapy

## 2023-06-06 ENCOUNTER — Ambulatory Visit: Payer: Medicare HMO | Attending: Family Medicine | Admitting: Physical Therapy

## 2023-06-06 DIAGNOSIS — R252 Cramp and spasm: Secondary | ICD-10-CM | POA: Diagnosis not present

## 2023-06-06 DIAGNOSIS — M25571 Pain in right ankle and joints of right foot: Secondary | ICD-10-CM | POA: Insufficient documentation

## 2023-06-06 DIAGNOSIS — G8929 Other chronic pain: Secondary | ICD-10-CM | POA: Insufficient documentation

## 2023-06-06 DIAGNOSIS — M6281 Muscle weakness (generalized): Secondary | ICD-10-CM | POA: Insufficient documentation

## 2023-06-06 NOTE — Therapy (Signed)
 OUTPATIENT PHYSICAL THERAPY LOWER EXTREMITY EVALUATION   Patient Name: Christy Kirby MRN: [id number] DOB:21-Dec-1950, 72 y.o., female Today's Date: 06/06/2023  END OF SESSION:  PT End of Session - 06/06/23 2030     Visit Number 1    Date for PT Re-Evaluation 08/29/23    Authorization Type [employer]    Progress Note Due on Visit 10    PT Start Time 1449    PT Stop Time 1532    PT Time Calculation (min) 43 min    Activity Tolerance Patient tolerated treatment well    Behavior During Therapy [facility] for tasks assessed/performed             Past Medical History:  Diagnosis Date   Arthritis    hip   GERD (gastroesophageal reflux disease)    Hyperlipidemia    Hypertension    Intractable nausea and vomiting 06/07/2022   Past Surgical History:  Procedure Laterality Date   BIOPSY  06/08/2022   Procedure: BIOPSY;  Surgeon: Wilhelmenia Aloha Raddle., MD;  Location: [facility];  Service: Gastroenterology;;   COLONOSCOPY  06/26/2017   per Dr. Abran, benign polyps, repeat in 10 yrs    ESOPHAGOGASTRODUODENOSCOPY (EGD) WITH PROPOFOL  N/A 06/08/2022   Procedure: ESOPHAGOGASTRODUODENOSCOPY (EGD) WITH PROPOFOL ;  Surgeon: Wilhelmenia Aloha Raddle., MD;  Location: [facility];  Service: Gastroenterology;  Laterality: N/A;   NEUROPLASTY / TRANSPOSITION MEDIAN NERVE AT CARPAL TUNNEL BILATERAL     POLYPECTOMY     TOTAL HIP ARTHROPLASTY Right 01/11/2018   Procedure: RIGHT TOTAL HIP ARTHROPLASTY ANTERIOR APPROACH;  Surgeon: Fidel Rogue, MD;  Location: [facility];  Service: Orthopedics;  Laterality: Right;   XI ROBOTIC ASSISTED SMALL BOWEL RESECTION N/A 10/07/2022   Procedure: XI ROBOTIC ASSISTED SMALL BOWEL RESECTION, BILATERAL TAP BLOCK, ASSESSMENT OF TISSUE PERFUSSION VIA FIREFLY INJECTION;  Surgeon: Sheldon Standing, MD;  Location: [facility];  Service: General;  Laterality: N/A;   Patient Active Problem List   Diagnosis Date Noted   SBO (small bowel obstruction) (HCC) 11/09/2022   Malignant  carcinoid tumor of ileum (HCC) 10/24/2022   Helicobacter pylori gastritis 10/12/2022   Ileal mass s/p robotic SB resection 10/07/2022 10/07/2022   History of abdominal pain 09/06/2022   Gastroenteritis 06/10/2022   Acute gastritis 06/09/2022   Hypokalemia 06/08/2022   Hypomagnesemia 06/08/2022   Intractable nausea and vomiting 06/07/2022   Hypertensive urgency 06/06/2022   Abdominal pain with vomiting 06/05/2022   HTN (hypertension) 09/30/2020   Osteoarthritis of right hip 01/11/2018   Hyperlipemia, mixed 02/08/2017   HIP PAIN, BILATERAL 04/07/2009   BACK PAIN, LUMBAR 04/07/2009   GERD 03/19/2007    PCP: Johnny Garnette LABOR, MD  REFERRING PROVIDER: Joane Artist RAMAN, MD  REFERRING DIAG: [phone] (ICD-10-CM) - Chronic pain of right ankle  THERAPY DIAG:  Pain in right ankle and joints of right foot  Muscle weakness (generalized)  Cramp and spasm  Rationale for Evaluation and Treatment: Rehabilitation  ONSET DATE: 3 mos ago  SUBJECTIVE:   SUBJECTIVE STATEMENT: Patient presents with Rt ankle pain that began three months ago. When she wakes up in the morning she has to limp to the bathroom. She has increased ankle soreness when she stands up after stilling for a while and with ascending & descending stairs. She has to take one step at a time. She does not normally exercises but she does a lot of moving around at her house. She wants to prevent getting a shot in her ankle.   Increased walking (around the mall)  is tiring for her.  PERTINENT HISTORY: HTN, Hx Rt total hip arthroplasty  PAIN:  Are you having pain? Yes: NPRS scale: 0(currently) 6(worst) Pain location: deep in anterior talocrural Joint  Pain description: soreness Aggravating factors: sit to stand transfer; releasing foot from gas pedal; ascending/ descending stairs Relieving factors: Nothing  PRECAUTIONS: None  RED FLAGS: None   WEIGHT BEARING RESTRICTIONS: No  FALLS:  Has patient fallen in last 6 months?  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  DIAGNOSTIC FINDINGS:  IMPRESSION: There is a 10 mm subcortical lucency of the medial talar dome. This likely reflects an osteochondral lesion.  PATIENT SURVEYS:  FOTO 49 goal 5  COGNITION: Overall cognitive status: Within functional limits for tasks assessed     SENSATION: WFL   POSTURE: rounded shoulders and forward head  PALPATION: Increased muscle spasms of bilateral gastroc Increased tenderness with AP Grade II to Rt talocrural   LOWER EXTREMITY ROM: WFL bilateral ; increased pain with Rt ankle DF   LOWER EXTREMITY MMT: Grossly 4/5 bilateral     FUNCTIONAL TESTS:  5 times sit to stand: 14.39 sec no UE support Timed up and go (TUG): 9.347 Single Leg Balance Rt:23.31 Lt:16.11sec  GAIT:  Comments:  occasional decreased Lt step length when standing                                                                                                                                TREATMENT DATE:  06/06/2023 Initial Evaluation & HEP created    PATIENT EDUCATION:  Education details: POC; HEP Person educated: Patient Education method: Programmer, Multimedia, Demonstration, and Handouts Education comprehension: verbalized understanding, returned demonstration, and needs further education  HOME EXERCISE PROGRAM: Access Code: [email or web address] URL: [email or web address] Date: 06/06/2023 Prepared by: Kristeen Sar  Exercises - Seated  Ankle Pumps  - 1 x daily - 7 x weekly - 1 sets - 10 reps - Seated Ankle Circles  - 1 x daily - 7 x weekly - 1 sets - 10 reps - Long Sitting Ankle Eversion with Resistance  - 1 x daily - 7 x weekly - 2 sets - 10 reps - Long Sitting Ankle Inversion with Resistance  - 1 x daily - 7 x weekly - 2 sets - 10 reps - Heel Raises with Counter Support  - 1 x daily - 7 x weekly - 2 sets - 10 reps - Gastroc Stretch on Wall  - 1 x daily - 7 x weekly -  2 sets - 20-30 hold - Soleus Stretch on Wall  - 1 x daily - 7 x weekly - 2 sets - 20-30 hold - Sit to Stand Without Arm Support  - 1 x daily - 7 x weekly - 2 sets - 10 reps  ASSESSMENT:  CLINICAL IMPRESSION: Patient is a 72 y.o. female who was seen today for physical therapy evaluation and treatment for chronic right ankle pain. Christy Kirby presents with increased right ankle pain that began three months ago. She experienced this same pain a year ago that went away on its own. Patient's pain is preventing her from negotiating stairs in a reciprocal pattern, waking up with increases soreness/ pain, and completing sit to stand transfers. Based on evaluation noted increases tenderness with grade II talocrural joint mobilizations, muscle weakness, and balance deficits. Patient is motivated and wants to minimize pain. Patient will benefit from skilled PT to address the below impairments and improve overall function.   OBJECTIVE IMPAIRMENTS: Abnormal gait, decreased balance, difficulty walking, decreased strength, increased muscle spasms, impaired flexibility, postural dysfunction, and pain.   ACTIVITY LIMITATIONS: standing, squatting, stairs, and transfers  PARTICIPATION LIMITATIONS: cleaning, laundry, shopping, and community activity  PERSONAL FACTORS: Age, Time since onset of injury/illness/exacerbation, and 1-2 comorbidities: HTN; poor fitness  are also affecting patient's functional outcome.   REHAB POTENTIAL: Good  CLINICAL DECISION MAKING:  Stable/uncomplicated  EVALUATION COMPLEXITY: Low   GOALS: Goals reviewed with patient? Yes  SHORT TERM GOALS: Target date: 07/18/2023  Patient will be independent with initial HEP. Baseline:  Goal status: INITIAL  2.  Patient will report > or = to 30% improvement in Rt ankle soreness since starting PT. Baseline:  Goal status: INITIAL  3.  Patient will be able to ascend/descend stairs in a reciprocal pattern. Baseline:  Goal status: INITIAL   LONG TERM GOALS: Target date: 08/29/2023  Patient will demonstrate independence in advanced HEP. Baseline:  Goal status: INITIAL  2.  Patient will report > or = to 70% improvement in Rt ankle soreness since starting PT. Baseline:  Goal status: INITIAL   3.  Patient will score > or = to 63 on FOTO for improved function. Baseline:  Goal status: INITIAL  4.  Patient will score < or = to 11 secs on 5STS for improved functional mobility. Baseline:  Goal status: INITIAL  5.  Patient will be able to walk around the mall with < or = to 2/10 Rt ankle pain. Baseline:  Goal status: INITIAL    PLAN:  PT FREQUENCY: every other week  PT DURATION: 12 weeks  PLANNED INTERVENTIONS: 97164- PT Re-evaluation, 97110-Therapeutic exercises, 97530- Therapeutic activity, [id number]- Neuromuscular re-education, 97535- Self Care, 02859- Manual therapy, [id number]- Gait training, [phone]- Canalith repositioning, [id number]- Aquatic Therapy, 97014- Electrical stimulation (unattended), [id number]- Electrical stimulation (manual), [id number]- Vasopneumatic device, [id number]- Ultrasound, [id number]- Traction (mechanical), [id number]- Ionotophoresis 4mg /ml Dexamethasone , Patient/Family education, Balance training, Stair training, Taping, Dry Needling, Joint mobilization, Joint manipulation, Spinal manipulation, Spinal mobilization, Scar mobilization, Vestibular training, Cryotherapy, and Moist heat  PLAN FOR NEXT SESSION: Assess HEP; NuStep; single leg balance; airex balance; manual to  calf   Kristeen Sar, PT 06/06/23 8:32 PM [facility] [address], [address] [place], [place] [address] Phone # [phone] Fax [phone]

## 2023-06-19 ENCOUNTER — Inpatient Hospital Stay: Payer: Medicare HMO | Attending: Hematology

## 2023-06-19 DIAGNOSIS — C7A012 Malignant carcinoid tumor of the ileum: Secondary | ICD-10-CM | POA: Diagnosis not present

## 2023-06-19 DIAGNOSIS — Z860100 Personal history of colon polyps, unspecified: Secondary | ICD-10-CM | POA: Diagnosis not present

## 2023-06-19 LAB — CMP (CANCER CENTER ONLY)
ALT: 6 U/L (ref 0–44)
AST: 15 U/L (ref 15–41)
Albumin: 3.8 g/dL (ref 3.5–5.0)
Alkaline Phosphatase: 51 U/L (ref 38–126)
Anion gap: 4 — ABNORMAL LOW (ref 5–15)
BUN: 17 mg/dL (ref 8–23)
CO2: 30 mmol/L (ref 22–32)
Calcium: 9.4 mg/dL (ref 8.9–10.3)
Chloride: 105 mmol/L (ref 98–111)
Creatinine: 0.99 mg/dL (ref 0.44–1.00)
GFR, Estimated: 59 mL/min — ABNORMAL LOW (ref >60)
Glucose, Bld: 89 mg/dL (ref 70–99)
Potassium: 4.2 mmol/L (ref 3.5–5.1)
Sodium: 139 mmol/L (ref 135–145)
Total Bilirubin: 0.5 mg/dL (ref 0.0–1.2)
Total Protein: 7.3 g/dL (ref 6.5–8.1)

## 2023-06-19 LAB — CBC WITH DIFFERENTIAL (CANCER CENTER ONLY)
Abs Immature Granulocytes: 0.01 10*3/uL (ref 0.00–0.07)
Basophils Absolute: 0.1 10*3/uL (ref 0.0–0.1)
Basophils Relative: 1 %
Eosinophils Absolute: 0.1 10*3/uL (ref 0.0–0.5)
Eosinophils Relative: 2 %
HCT: 36.5 % (ref 36.0–46.0)
Hemoglobin: 12.2 g/dL (ref 12.0–15.0)
Immature Granulocytes: 0 %
Lymphocytes Relative: 33 %
Lymphs Abs: 1.2 10*3/uL (ref 0.7–4.0)
MCH: 29.9 pg (ref 26.0–34.0)
MCHC: 33.4 g/dL (ref 30.0–36.0)
MCV: 89.5 fL (ref 80.0–100.0)
Monocytes Absolute: 0.2 10*3/uL (ref 0.1–1.0)
Monocytes Relative: 7 %
Neutro Abs: 2 10*3/uL (ref 1.7–7.7)
Neutrophils Relative %: 57 %
Platelet Count: 288 10*3/uL (ref 150–400)
RBC: 4.08 MIL/uL (ref 3.87–5.11)
RDW: 12.7 % (ref 11.5–15.5)
WBC Count: 3.5 10*3/uL — ABNORMAL LOW (ref 4.0–10.5)
nRBC: 0 % (ref 0.0–0.2)

## 2023-06-21 LAB — CHROMOGRANIN A: Chromogranin A (ng/mL): 44.1 ng/mL (ref 0.0–101.8)

## 2023-06-22 ENCOUNTER — Telehealth: Payer: Self-pay | Admitting: Physical Therapy

## 2023-06-22 ENCOUNTER — Ambulatory Visit: Payer: Medicare HMO | Attending: Family Medicine | Admitting: Physical Therapy

## 2023-06-22 DIAGNOSIS — R252 Cramp and spasm: Secondary | ICD-10-CM | POA: Insufficient documentation

## 2023-06-22 DIAGNOSIS — M6281 Muscle weakness (generalized): Secondary | ICD-10-CM | POA: Insufficient documentation

## 2023-06-22 DIAGNOSIS — M25571 Pain in right ankle and joints of right foot: Secondary | ICD-10-CM | POA: Insufficient documentation

## 2023-06-22 NOTE — Telephone Encounter (Signed)
Left patient a voicemail about missed appointment today. Reminded patient of her next scheduled appointment 1/30 at 4:15pm and left a call back number to the clinic.  Claude Manges, PT 06/22/23 4:36 PM

## 2023-06-25 NOTE — Assessment & Plan Note (Signed)
pT4N1M0 stage III -diagnosed in 10/2022 -Underwent ileal resection by Dr. Michaell Cowing on 10/07/2022.  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 Ki-67 1%. Also mesenteric tumor deposit measuring 2.7 cm was resected.  Staged as a T4, N1.  -Dotatate scan showed single focus (8 mm) of radiotracer activity in the ventral peritoneal space of the mid right abdomen.  -Discussed with Dr. Michaell Cowing who feels that surgery may put the small bowel blood supply at risk, so we decided to observe with annual scan and office visit with lab every 6 months

## 2023-06-26 ENCOUNTER — Inpatient Hospital Stay (HOSPITAL_BASED_OUTPATIENT_CLINIC_OR_DEPARTMENT_OTHER): Payer: Medicare HMO | Admitting: Hematology

## 2023-06-26 ENCOUNTER — Encounter: Payer: Self-pay | Admitting: Hematology

## 2023-06-26 VITALS — BP 143/91 | HR 73 | Temp 97.8°F | Resp 17 | Wt 146.4 lb

## 2023-06-26 DIAGNOSIS — C7A012 Malignant carcinoid tumor of the ileum: Secondary | ICD-10-CM

## 2023-06-26 DIAGNOSIS — Z860100 Personal history of colon polyps, unspecified: Secondary | ICD-10-CM | POA: Diagnosis not present

## 2023-06-26 NOTE — Progress Notes (Signed)
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 Michaell Cowing on 10/07/2022.  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 Ki-67 1%. Also mesenteric tumor deposit measuring 2.7 cm was resected.  Staged as a T4, N1.  -Dotatate scan showed single focus (8 mm) of radiotracer activity in the ventral peritoneal space of the mid right abdomen.  -Discussed with Dr. Michaell Cowing who feels that surgery may put the small bowel blood supply at risk, so we decided to observe with annual scan and office visit with lab every 6 months    Assessment and Plan    Neuroendocrine Tumor Follow-up for neuroendocrine tumor. No new symptoms. Post-surgery, stools are thinner, likely due to small intestine resection. Last PET scan showed a small lymph node among blood vessels, not surgically removable. Tumor marker levels are normal. Slightly low WBC count, normal neutrophil count. Discussed slow-growing nature of the lymph node and monitoring approach. - Repeat CT scan of abdomen and pelvis one week before next visit - Schedule follow-up appointment in six months  Colorectal Cancer Screening Last colonoscopy 6-7 years ago found a 2mm polyp and removed a hemorrhoid. Given polyp history and bowel habit changes, a repeat colonoscopy is recommended. - Schedule repeat colonoscopy  General Health Maintenance Up-to-date with annual mammograms with  oncologist. - Continue annual mammograms with oncologist.     Plan -Lab reviewed, patient is clinically doing well, no concern for progression -Follow-up in 6 months with lab, CT abdomen pelvis with contrast 1 week before -Will send a message to Dr. Marina Goodell about repeating colonoscopy    SUMMARY OF ONCOLOGIC HISTORY: Oncology History  Malignant carcinoid tumor of ileum (HCC)  06/07/2022 Imaging   CT ABDOMEN PELVIS W CONTRAST   IMPRESSION: 1. Stranding in the ileal mesentery surrounding a low-density area in hindsight as seen on the prior study. This low-density area measures 19 x 17 mm. There is suggestion of some peripheral and nodular enhancement on coronal images. Findings are nonspecific and are of uncertain significance. Differential diagnosis would include Meckel's diverticulitis or cystic/necrotic lymph node or nodal metastasis in the ileal mesentery. Given nodular enhancement at the periphery underlying neoplasm would be difficult to exclude. Interloop abscess is felt less likely but given stranding not entirely excluded. Would also correlate with any signs of history of GI bleeding. Surgical consultation may be helpful. 2. No signs of pneumatosis or other secondary signs of bowel compromise. 3. Cholelithiasis without evidence of acute cholecystitis. 4. Trace free fluid in the pelvis, of uncertain significance. 5. Uterus with multiple leiomyomata, not well assessed on CT due to streak artifact from RIGHT hip arthroplasty. 6. Aortic atherosclerosis.   Aortic Atherosclerosis (ICD10-I70.0).     Electronically Signed   By: Donzetta Kohut M.D.   On: 06/07/2022 11:47   07/14/2022 Imaging   CT ENTERO ABD/PELVIS W CONTAST   IMPRESSION: 11 mm distal small bowel lesion, suspicious for small bowel carcinoid.   Associated ileal mesenteric nodes correspond to suspected mesenteric carcinoid.  Cholelithiasis, without associated inflammatory changes.   10/07/2022 Procedure    ROBOTIC ILEAL RESECTION by Dr. Michaell Cowing    10/07/2022 Initial Diagnosis   Well-differentiated neuroendocrine tumor (G1; low-grade) of the ileum    10/07/2022 Pathology Results   SURGICAL PATHOLOGY  CASE: [id number]   B. ILEUM MASS, RESECTION:  - Well-differentiated neuroendocrine tumor (G1; low-grade), 1.5 cm,  involving ileum  - Tumor involves the serosal surface  - Resection margins are negative for tumor  - Metastatic tumor to two of three lymph nodes (2/3)  - Mesenteric tumor deposit, 2.7 cm  - See oncology table    10/07/2022 Cancer Staging   Staging form: Neuroendocrine Tumor - Jejunum/Ileum, AJCC V9 - Pathologic stage from 10/07/2022: Stage III (pT4, pN1, cM0) - Signed by Malachy Mood, MD on 06/25/2023 Histologic grade (G): G1 Histologic grading system: 3 grade system Residual tumor (R): R0 - None   11/04/2022 Initial Diagnosis   Carcinoid tumor of ileum      Discussed the use of AI scribe software for clinical note transcription with the patient, who gave verbal consent to proceed.  History of Present Illness   Christy Kirby, a 73 year old patient with a history of neuroendocrine tumor, presents for a routine follow-up visit. She reports no new health issues in the past six months. She denies any stomach issues, pain, discomfort, or bloating and maintains a good appetite. Her bowel movements are regular, but she notes a change in the size of her stools, which have become smaller and thinner since her surgery. She denies any difficulty in passing stools or any presence of blood in her stools. Her last colonoscopy was approximately six to seven years ago, during which a small polyp was found and removed. She also mentions a previous surgery, presumably for her neuroendocrine tumor, after which she noticed the change in her bowel habits.         All other systems were reviewed with the patient and are negative.  MEDICAL HISTORY:  Past Medical History:  Diagnosis Date   Arthritis    hip    GERD (gastroesophageal reflux disease)    Hyperlipidemia    Hypertension    Intractable nausea and vomiting 06/07/2022    SURGICAL HISTORY: Past Surgical History:  Procedure Laterality Date   BIOPSY  06/08/2022   Procedure: BIOPSY;  Surgeon: Lemar Lofty., MD;  Location: [facility];  Service: Gastroenterology;;   COLONOSCOPY  06/26/2017   per Dr. Marina Goodell, benign polyps, repeat in 10 yrs    ESOPHAGOGASTRODUODENOSCOPY (EGD) WITH PROPOFOL N/A 06/08/2022   Procedure: ESOPHAGOGASTRODUODENOSCOPY (EGD) WITH PROPOFOL;  Surgeon: Lemar Lofty., MD;  Location: [facility];  Service: Gastroenterology;  Laterality: N/A;   NEUROPLASTY / TRANSPOSITION MEDIAN NERVE AT CARPAL TUNNEL BILATERAL     POLYPECTOMY     TOTAL HIP ARTHROPLASTY Right 01/11/2018   Procedure: RIGHT TOTAL HIP ARTHROPLASTY ANTERIOR APPROACH;  Surgeon: Samson Frederic, MD;  Location: [facility];  Service: Orthopedics;  Laterality: Right;   XI ROBOTIC ASSISTED SMALL BOWEL RESECTION N/A 10/07/2022   Procedure: XI ROBOTIC ASSISTED SMALL BOWEL RESECTION, BILATERAL TAP BLOCK, ASSESSMENT OF TISSUE PERFUSSION VIA FIREFLY INJECTION;  Surgeon: Karie Soda, MD;  Location: [facility];  Service: General;  Laterality: N/A;    I have reviewed the social history and family history with the patient and they are unchanged from previous note.  ALLERGIES:  is allergic to codeine.  MEDICATIONS:  Current Outpatient Medications  Medication Sig Dispense Refill   acetaminophen (TYLENOL) 325 MG tablet Take 2  tablets (650 mg total) by mouth every 6 (six) hours as needed for mild pain or headache. (Patient not taking: Reported on 12/19/2022)     ibuprofen (ADVIL) 800 MG tablet Take 1 tablet (800 mg total) by mouth every 8 (eight) hours as needed for moderate pain (pain score 4-6). (Patient not taking: Reported on 06/06/2023) 60 tablet 5   metoprolol tartrate (LOPRESSOR) 25 MG tablet Take 1 tablet (25 mg total) by mouth 2 (two) times daily.     No  current facility-administered medications for this visit.    PHYSICAL EXAMINATION: ECOG PERFORMANCE STATUS: 0 - Asymptomatic  Vitals:   06/26/23 0930  BP: (!) 143/91  Pulse: 73  Resp: 17  Temp: 97.8 F (36.6 C)  SpO2: 100%   Wt Readings from Last 3 Encounters:  06/26/23 146 lb 6.4 oz (66.4 kg)  05/16/23 143 lb (64.9 kg)  05/02/23 141 lb (64 kg)     GENERAL:alert, no distress and comfortable SKIN: skin color, texture, turgor are normal, no rashes or significant lesions EYES: normal, Conjunctiva are pink and non-injected, sclera clear NECK: supple, thyroid normal size, non-tender, without nodularity LYMPH:  no palpable lymphadenopathy in the cervical, axillary  LUNGS: clear to auscultation and percussion with normal breathing effort HEART: regular rate & rhythm and no murmurs and no lower extremity edema ABDOMEN:abdomen soft, non-tender and normal bowel sounds Musculoskeletal:no cyanosis of digits and no clubbing  NEURO: alert & oriented x 3 with fluent speech, no focal motor/sensory deficits   LABORATORY DATA:  I have reviewed the data as listed    Latest Ref Rng & Units 06/19/2023    9:48 AM 11/10/2022    4:52 AM 11/09/2022    4:39 AM  CBC  WBC 4.0 - 10.5 K/uL 3.5  3.7  4.7   Hemoglobin 12.0 - 15.0 g/dL 16.1  09.6  04.5   Hematocrit 36.0 - 46.0 % 36.5  30.8  34.3   Platelets 150 - 400 K/uL 288  202  225         Latest Ref Rng & Units 06/19/2023    9:48 AM 11/11/2022    5:20 AM 11/10/2022    4:52 AM  CMP  Glucose 70 - 99 mg/dL 89  97  85   BUN 8 - 23 mg/dL 17  6  7    Creatinine 0.44 - 1.00 mg/dL 4.09  8.11  9.14   Sodium 135 - 145 mmol/L 139  138  135   Potassium 3.5 - 5.1 mmol/L 4.2  4.4  3.3   Chloride 98 - 111 mmol/L 105  106  102   CO2 22 - 32 mmol/L 30  28  25    Calcium 8.9 - 10.3 mg/dL 9.4  8.9  8.5   Total Protein 6.5 - 8.1 g/dL 7.3     Total Bilirubin 0.0 - 1.2 mg/dL 0.5     Alkaline Phos 38 - 126 U/L 51     AST 15 - 41 U/L 15     ALT 0 - 44 U/L 6          RADIOGRAPHIC STUDIES: I have personally reviewed the radiological images as listed and agreed with the findings in the report. No results found.    Orders Placed This Encounter  Procedures   CT ABDOMEN PELVIS W CONTRAST    Standing Status:   Future    Expected Date:   12/19/2023    Expiration Date:   06/25/2024    If indicated for the  ordered procedure, I authorize the administration of contrast media per Radiology protocol:   Yes    Does the patient have a contrast media/X-ray dye allergy?:   No    Preferred imaging location?:   [facility]    If indicated for the ordered procedure, I authorize the administration of oral contrast media per Radiology protocol:   Yes   All questions were answered. The patient knows to call the clinic with any problems, questions or concerns. No barriers to learning was detected. The total time spent in the appointment was 25 minutes.     Malachy Mood, MD 06/26/2023

## 2023-06-27 ENCOUNTER — Ambulatory Visit: Payer: Medicare HMO | Admitting: Family Medicine

## 2023-06-27 NOTE — Progress Notes (Deleted)
   Rubin Payor, PhD, LAT, ATC acting as a scribe for Clementeen Graham, MD.  Christy Kirby is a 73 y.o. female who presents to [facility] today for f/u R ankle pain. Pt was last seen by Dr. Denyse Amass on 05/16/23 and she was advised to use Voltaren gel and was referred to PT, but only completed the initial evaluation.  Today, pt reports ***  Dx imaging: 05/16/23 R ankle XR  Pertinent review of systems: ***  Relevant historical information: ***   Exam:  There were no vitals taken for this visit. General: Well Developed, well nourished, and in no acute distress.   MSK: ***    Lab and Radiology Results No results found for this or any previous visit (from the past 72 hours). No results found.     Assessment and Plan: 73 y.o. female with ***   PDMP not reviewed this encounter. No orders of the defined types were placed in this encounter.  No orders of the defined types were placed in this encounter.    Discussed warning signs or symptoms. Please see discharge instructions. Patient expresses understanding.   ***

## 2023-06-28 ENCOUNTER — Telehealth: Payer: Self-pay | Admitting: Hematology

## 2023-06-28 NOTE — Telephone Encounter (Signed)
 Left patient a voicemail in regards to scheduled appointment times/dates; left callback number if needed for rescheduling

## 2023-07-06 ENCOUNTER — Ambulatory Visit: Payer: Medicare HMO | Admitting: Physical Therapy

## 2023-07-06 ENCOUNTER — Encounter: Payer: Self-pay | Admitting: Physical Therapy

## 2023-07-06 DIAGNOSIS — M6281 Muscle weakness (generalized): Secondary | ICD-10-CM | POA: Diagnosis not present

## 2023-07-06 DIAGNOSIS — R252 Cramp and spasm: Secondary | ICD-10-CM

## 2023-07-06 DIAGNOSIS — M25571 Pain in right ankle and joints of right foot: Secondary | ICD-10-CM | POA: Diagnosis not present

## 2023-07-06 NOTE — Therapy (Signed)
OUTPATIENT PHYSICAL THERAPY LOWER EXTREMITY TREATMENT   Patient Name: Christy Kirby MRN: [id number] DOB:August 18, 1950, 73 y.o., female Today's Date: 07/06/2023  END OF SESSION:  PT End of Session - 07/06/23 1704     Visit Number 2    Date for PT Re-Evaluation 08/29/23    Authorization Type [employer]    Authorization Time Period 06/06/2023-08/29/2023    Authorization - Visit Number 1    Authorization - Number of Visits 7    Progress Note Due on Visit 10    PT Start Time 1620    PT Stop Time 1701    PT Time Calculation (min) 41 min    Activity Tolerance Patient tolerated treatment well    Behavior During Therapy [facility] for tasks assessed/performed              Past Medical History:  Diagnosis Date   Arthritis    hip   GERD (gastroesophageal reflux disease)    Hyperlipidemia    Hypertension    Intractable nausea and vomiting 06/07/2022   Past Surgical History:  Procedure Laterality Date   BIOPSY  06/08/2022   Procedure: BIOPSY;  Surgeon: Lemar Lofty., MD;  Location: [facility];  Service: Gastroenterology;;   COLONOSCOPY  06/26/2017   per Dr. Marina Goodell, benign polyps, repeat in 10 yrs    ESOPHAGOGASTRODUODENOSCOPY (EGD) WITH PROPOFOL N/A 06/08/2022   Procedure: ESOPHAGOGASTRODUODENOSCOPY (EGD) WITH PROPOFOL;  Surgeon: Lemar Lofty., MD;  Location: [facility];  Service: Gastroenterology;  Laterality: N/A;   NEUROPLASTY / TRANSPOSITION MEDIAN NERVE AT CARPAL TUNNEL BILATERAL     POLYPECTOMY     TOTAL HIP ARTHROPLASTY Right 01/11/2018   Procedure: RIGHT TOTAL HIP ARTHROPLASTY ANTERIOR APPROACH;  Surgeon: Samson Frederic, MD;  Location: [facility];  Service: Orthopedics;  Laterality: Right;   XI ROBOTIC ASSISTED SMALL BOWEL RESECTION N/A 10/07/2022   Procedure: XI ROBOTIC ASSISTED SMALL BOWEL RESECTION, BILATERAL TAP BLOCK, ASSESSMENT OF TISSUE PERFUSSION VIA FIREFLY INJECTION;  Surgeon: Karie Soda, MD;  Location: [facility];  Service: General;  Laterality: N/A;    Patient Active Problem List   Diagnosis Date Noted   SBO (small bowel obstruction) (HCC) 11/09/2022   Malignant carcinoid tumor of ileum (HCC) 10/24/2022   Helicobacter pylori gastritis 10/12/2022   Ileal mass s/p robotic SB resection 10/07/2022 10/07/2022   History of abdominal pain 09/06/2022   Gastroenteritis 06/10/2022   Acute gastritis 06/09/2022   Hypokalemia 06/08/2022   Hypomagnesemia 06/08/2022   Intractable nausea and vomiting 06/07/2022   Hypertensive urgency 06/06/2022   Abdominal pain with vomiting 06/05/2022   HTN (hypertension) 09/30/2020   Osteoarthritis of right hip 01/11/2018   Hyperlipemia, mixed 02/08/2017   HIP PAIN, BILATERAL 04/07/2009   BACK PAIN, LUMBAR 04/07/2009   GERD 03/19/2007    PCP: Nelwyn Salisbury, MD  REFERRING PROVIDER: Rodolph Bong, MD  REFERRING DIAG: [phone] (ICD-10-CM) - Chronic pain of right ankle  THERAPY DIAG:  Pain in right ankle and joints of right foot  Muscle weakness (generalized)  Cramp and spasm  Rationale for Evaluation and Treatment: Rehabilitation  ONSET DATE: 3 mos ago  SUBJECTIVE:   SUBJECTIVE STATEMENT: Patient reports her ankle feels very sore today. When she wakes up in the morning it feels the most stiff.   From Eval: Patient presents with Rt ankle pain that began three months ago. When she wakes up in the morning she has to limp to the bathroom. She has increased ankle soreness when she stands up after stilling for a  while and with ascending & descending stairs. She has to take one step at a time. She does not normally exercises but she does a lot of moving around at her house. She wants to prevent getting a shot in her ankle.   Increased walking (around the mall) is tiring for her.  PERTINENT HISTORY: HTN, Hx Rt total hip arthroplasty  PAIN: 07/06/2023 Are you having pain? Yes: NPRS scale: 6 Pain location: deep in anterior talocrural Joint  Pain description: soreness Aggravating factors: sit to  stand transfer; releasing foot from gas pedal; ascending/ descending stairs Relieving factors: Nothing  PRECAUTIONS: None  RED FLAGS: None   WEIGHT BEARING RESTRICTIONS: No  FALLS:  Has patient fallen in last 6 months? 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  DIAGNOSTIC FINDINGS:  IMPRESSION: There is a 10 mm subcortical lucency of the medial talar dome. This likely reflects an osteochondral lesion.  PATIENT SURVEYS:  FOTO 49 goal 55  COGNITION: Overall cognitive status: Within functional limits for tasks assessed     SENSATION: WFL   POSTURE: rounded shoulders and forward head  PALPATION: Increased muscle spasms of bilateral gastroc Increased tenderness with AP Grade II to Rt talocrural   LOWER EXTREMITY ROM: WFL bilateral ; increased pain with Rt ankle DF   LOWER EXTREMITY MMT: Grossly 4/5 bilateral     FUNCTIONAL TESTS:  5 times sit to stand: 14.39 sec no UE support Timed up and go (TUG): 9.347 Single Leg Balance Rt:23.31 Lt:16.11sec  GAIT:  Comments:  occasional decreased Lt step length when standing                                                                                                                                TREATMENT DATE:  07/06/2023 Recumbent Bike Level 1 5 mins- PT present to discuss status Seated ankle pumps x 10 Seated ankle circles x 7 each direction Ankle Eversion green TB 2 x 10 Ankle Inversion green TB 2 x 10 Gastroc stretch  on wall 2 x 30 sec on Rt  Soleus stretch on wall 2 x 30 sec on Rt  Heel raises 2 x 10 Marching on airex x 20  Airex step ups + knee drive x 20 Lateral airex step ups x 20 Rocker board (m/l) (a/p) x 2 mins each direction Side stepping with blue loop around feet x 4 laps Sit to stand + heel raise 2 x 10 Great toe stretch 2 x 20 sec on Rt  Plantar fascia & toe extension stretch 2 x 20 sec on Rt     06/06/2023 Initial Evaluation & HEP created    PATIENT EDUCATION:  Education details: POC; HEP Person educated: Patient Education method: Explanation, Demonstration, and Handouts Education comprehension: verbalized understanding, returned demonstration, and needs further education  HOME EXERCISE PROGRAM: Access Code: [email or web address] URL: [email or web address] Date: 07/06/2023 Prepared by: Claude Manges  Exercises - Seated Ankle Pumps  - 1 x daily - 7 x weekly - 1 sets - 10 reps - Seated Ankle Circles  - 1 x daily - 7 x weekly - 1 sets - 10 reps - Long Sitting Ankle Eversion with Resistance  - 1 x daily - 7 x weekly - 2 sets - 10 reps - Long Sitting Ankle Inversion with Resistance  - 1 x daily - 7 x weekly - 2 sets - 10 reps - Heel Raises with Counter Support  - 1 x daily - 7 x weekly - 2 sets - 10 reps - Gastroc Stretch on Wall  - 1 x daily - 7 x weekly - 2 sets - 20-30 hold - Soleus Stretch on Wall  - 1 x daily - 7 x weekly - 2 sets - 20-30 hold - Sit to Stand Without Arm Support  - 1 x daily - 7 x weekly - 2 sets - 10 reps - Seated Self Great Toe Stretch  - 1 x daily - 7 x weekly - 2 sets - 20-30 hold - Seated Plantar Fascia Stretch  - 1 x daily - 7 x weekly - 2 sets - 20-30 hold - Seated Toe Flexion Extension PROM  - 1 x daily - 7 x weekly - 2 sets - 20-30 hold ASSESSMENT:  CLINICAL IMPRESSION: Jilliam presents to therapy with increased ankle soreness. She has been semi- compliant with HEP. She required verbal and visual cues for form correction while performing gastroc and  soleus stretch and ankle strengthening exercises. She tolerated treatment session well. She verbalized occasional ankle soreness with exercises, but she was able to participate in all exercises. Updated HEP to include plantar fascia and great toes stretch. Patient will benefit from skilled PT to address the below impairments and improve overall function.    OBJECTIVE IMPAIRMENTS: Abnormal gait, decreased balance, difficulty walking, decreased strength, increased muscle spasms, impaired flexibility, postural dysfunction, and pain.   ACTIVITY LIMITATIONS: standing, squatting, stairs, and transfers  PARTICIPATION LIMITATIONS: cleaning, laundry, shopping, and community activity  PERSONAL FACTORS: Age, Time since onset of injury/illness/exacerbation, and 1-2 comorbidities: HTN; poor fitness  are also affecting patient's functional outcome.   REHAB POTENTIAL: Good  CLINICAL DECISION MAKING: Stable/uncomplicated  EVALUATION COMPLEXITY: Low   GOALS: Goals reviewed with patient? Yes  SHORT TERM GOALS: Target date: 07/18/2023  Patient will be independent with initial HEP. Baseline:  Goal status: INITIAL  2.  Patient will report > or = to 30% improvement in Rt ankle soreness since starting PT. Baseline:  Goal status: INITIAL  3.  Patient will be able to ascend/descend stairs in a reciprocal pattern. Baseline:  Goal status: INITIAL   LONG TERM GOALS: Target date: 08/29/2023  Patient will demonstrate independence in advanced HEP. Baseline:  Goal status: INITIAL  2.  Patient will report > or = to 70% improvement in Rt ankle soreness since starting PT. Baseline:  Goal status: INITIAL   3.  Patient will score > or = to 63 on FOTO for improved function. Baseline:  Goal status: INITIAL  4.  Patient will score <  or = to 11 secs on 5STS for improved functional mobility. Baseline:  Goal status: INITIAL  5.  Patient will be able to walk around the mall with < or = to 2/10 Rt ankle  pain. Baseline:  Goal status: INITIAL    PLAN:  PT FREQUENCY: every other week  PT DURATION: 12 weeks  PLANNED INTERVENTIONS: 97164- PT Re-evaluation, 97110-Therapeutic exercises, 97530- Therapeutic activity, 97112- Neuromuscular re-education, 97535- Self Care, 40981- Manual therapy, [phone]- Gait training, [id number]- Canalith repositioning, [id number]- Aquatic Therapy, 97014- Electrical stimulation (unattended), [id number]- Electrical stimulation (manual), [id number]- Vasopneumatic device, [id number]- Ultrasound, [id number]- Traction (mechanical), [id number]- Ionotophoresis 4mg /ml Dexamethasone, Patient/Family education, Balance training, Stair training, Taping, Dry Needling, Joint mobilization, Joint manipulation, Spinal manipulation, Spinal mobilization, Scar mobilization, Vestibular training, Cryotherapy, and Moist heat  PLAN FOR NEXT SESSION: Assess tolerance to treatment session; forward T; single leg balance   Claude Manges, PT 07/06/23 5:05 PM [facility] [address] [place], [place] [address] Phone # [phone] Fax [phone]

## 2023-07-20 ENCOUNTER — Ambulatory Visit: Payer: Medicare HMO | Attending: Family Medicine | Admitting: Physical Therapy

## 2023-07-20 ENCOUNTER — Telehealth: Payer: Self-pay | Admitting: Physical Therapy

## 2023-07-20 DIAGNOSIS — M25571 Pain in right ankle and joints of right foot: Secondary | ICD-10-CM | POA: Insufficient documentation

## 2023-07-20 DIAGNOSIS — M6281 Muscle weakness (generalized): Secondary | ICD-10-CM | POA: Insufficient documentation

## 2023-07-20 DIAGNOSIS — R252 Cramp and spasm: Secondary | ICD-10-CM | POA: Insufficient documentation

## 2023-07-20 NOTE — Telephone Encounter (Signed)
Spoke to patient about missed appointment. She verbalized that she forgot about appointment due to a lot of things going on. She confirmed next appointment 2/27 at 4:15pm.   Claude Manges, PT 07/20/23 4:02 PM

## 2023-08-03 ENCOUNTER — Ambulatory Visit: Payer: Medicare HMO | Admitting: Physical Therapy

## 2023-08-03 ENCOUNTER — Encounter: Payer: Self-pay | Admitting: Physical Therapy

## 2023-08-03 DIAGNOSIS — M25571 Pain in right ankle and joints of right foot: Secondary | ICD-10-CM

## 2023-08-03 DIAGNOSIS — R252 Cramp and spasm: Secondary | ICD-10-CM

## 2023-08-03 DIAGNOSIS — M6281 Muscle weakness (generalized): Secondary | ICD-10-CM | POA: Diagnosis not present

## 2023-08-03 NOTE — Therapy (Signed)
 OUTPATIENT PHYSICAL THERAPY LOWER EXTREMITY TREATMENT   Patient Name: Chantale Leugers MRN: [id number] DOB:22-Oct-1950, 73 y.o., female Today's Date: 08/03/2023  END OF SESSION:  PT End of Session - 08/03/23 1653     Visit Number 3    Date for PT Re-Evaluation 08/29/23    Authorization Type [employer]    Authorization Time Period 06/06/2023-08/29/2023    Authorization - Visit Number 2    Authorization - Number of Visits 7    Progress Note Due on Visit 10    PT Start Time 1614    PT Stop Time 1655    PT Time Calculation (min) 41 min    Activity Tolerance Patient tolerated treatment well    Behavior During Therapy [facility] for tasks assessed/performed               Past Medical History:  Diagnosis Date   Arthritis    hip   GERD (gastroesophageal reflux disease)    Hyperlipidemia    Hypertension    Intractable nausea and vomiting 06/07/2022   Past Surgical History:  Procedure Laterality Date   BIOPSY  06/08/2022   Procedure: BIOPSY;  Surgeon: Lemar Lofty., MD;  Location: [facility];  Service: Gastroenterology;;   COLONOSCOPY  06/26/2017   per Dr. Marina Goodell, benign polyps, repeat in 10 yrs    ESOPHAGOGASTRODUODENOSCOPY (EGD) WITH PROPOFOL N/A 06/08/2022   Procedure: ESOPHAGOGASTRODUODENOSCOPY (EGD) WITH PROPOFOL;  Surgeon: Lemar Lofty., MD;  Location: [facility];  Service: Gastroenterology;  Laterality: N/A;   NEUROPLASTY / TRANSPOSITION MEDIAN NERVE AT CARPAL TUNNEL BILATERAL     POLYPECTOMY     TOTAL HIP ARTHROPLASTY Right 01/11/2018   Procedure: RIGHT TOTAL HIP ARTHROPLASTY ANTERIOR APPROACH;  Surgeon: Samson Frederic, MD;  Location: [facility];  Service: Orthopedics;  Laterality: Right;   XI ROBOTIC ASSISTED SMALL BOWEL RESECTION N/A 10/07/2022   Procedure: XI ROBOTIC ASSISTED SMALL BOWEL RESECTION, BILATERAL TAP BLOCK, ASSESSMENT OF TISSUE PERFUSSION VIA FIREFLY INJECTION;  Surgeon: Karie Soda, MD;  Location: [facility];  Service: General;  Laterality: N/A;    Patient Active Problem List   Diagnosis Date Noted   SBO (small bowel obstruction) (HCC) 11/09/2022   Malignant carcinoid tumor of ileum (HCC) 10/24/2022   Helicobacter pylori gastritis 10/12/2022   Ileal mass s/p robotic SB resection 10/07/2022 10/07/2022   History of abdominal pain 09/06/2022   Gastroenteritis 06/10/2022   Acute gastritis 06/09/2022   Hypokalemia 06/08/2022   Hypomagnesemia 06/08/2022   Intractable nausea and vomiting 06/07/2022   Hypertensive urgency 06/06/2022   Abdominal pain with vomiting 06/05/2022   HTN (hypertension) 09/30/2020   Osteoarthritis of right hip 01/11/2018   Hyperlipemia, mixed 02/08/2017   HIP PAIN, BILATERAL 04/07/2009   BACK PAIN, LUMBAR 04/07/2009   GERD 03/19/2007    PCP: Nelwyn Salisbury, MD  REFERRING PROVIDER: Rodolph Bong, MD  REFERRING DIAG: [phone] (ICD-10-CM) - Chronic pain of right ankle  THERAPY DIAG:  Pain in right ankle and joints of right foot  Muscle weakness (generalized)  Cramp and spasm  Rationale for Evaluation and Treatment: Rehabilitation  ONSET DATE: 3 mos ago  SUBJECTIVE:   SUBJECTIVE STATEMENT: Patient reports her ankle is very sore today.   From Eval: Patient presents with Rt ankle pain that began three months ago. When she wakes up in the morning she has to limp to the bathroom. She has increased ankle soreness when she stands up after stilling for a while and with ascending & descending stairs. She has to take  one step at a time. She does not normally exercises but she does a lot of moving around at her house. She wants to prevent getting a shot in her ankle.   Increased walking (around the mall) is tiring for her.  PERTINENT HISTORY: HTN, Hx Rt total hip arthroplasty   PAIN: 07/06/2023 Are you having pain? Yes: NPRS scale: 6 Pain location: deep in anterior talocrural Joint  Pain description: soreness Aggravating factors: sit to stand transfer; releasing foot from gas pedal; ascending/  descending stairs Relieving factors: Nothing  PRECAUTIONS: None  RED FLAGS: None   WEIGHT BEARING RESTRICTIONS: No  FALLS:  Has patient fallen in last 6 months? 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  DIAGNOSTIC FINDINGS:  IMPRESSION: There is a 10 mm subcortical lucency of the medial talar dome. This likely reflects an osteochondral lesion.  PATIENT SURVEYS:  FOTO 49 goal 69  COGNITION: Overall cognitive status: Within functional limits for tasks assessed     SENSATION: WFL   POSTURE: rounded shoulders and forward head  PALPATION: Increased muscle spasms of bilateral gastroc Increased tenderness with AP Grade II to Rt talocrural   LOWER EXTREMITY ROM: WFL bilateral ; increased pain with Rt ankle DF   LOWER EXTREMITY MMT: Grossly 4/5 bilateral     FUNCTIONAL TESTS:  5 times sit to stand: 14.39 sec no UE support Timed up and go (TUG): 9.347 Single Leg Balance Rt:23.31 Lt:16.11sec  GAIT:  Comments:  occasional decreased Lt step length when standing                                                                                                                                TREATMENT DATE:  08/03/2023 NuStep Level 3 6 mins- PT present to discuss status Gastroc stretch on rocker board  2 x 30 sec  Airex step ups x 10 each min/mod UE support Airex lateral step ups x 10 each with UE support Single leg balance + cone taps (cone on counter) x 10 taps each  leg Single leg balance + cone taps (cone on plinth) x 10 taps each leg Heel raises holding 5# DB x 20 Forward lunges on BOSU x 10 each Single leg balance + cone tap ( cones forwards, sideways, back) x8 each Forward T x 10 each direction TA raises leaning against wall 2 x 10 Rocker board (m/l) (a/p) x 2 mins each direction    07/06/2023 Recumbent Bike Level 1 5 mins- PT present to  discuss status Seated ankle pumps x 10 Seated ankle circles x 7 each direction Ankle Eversion green TB 2 x 10 Ankle Inversion green TB 2 x 10 Gastroc stretch on wall 2 x 30 sec on Rt  Soleus stretch on wall 2 x 30 sec on Rt  Heel raises 2 x 10 Marching on airex x 20  Airex step ups + knee drive x 20 Lateral airex step ups x 20 Rocker board (m/l) (a/p) x 2 mins each direction Side stepping with blue loop around feet x 4 laps Sit to stand + heel raise 2 x 10 Great toe stretch 2 x 20 sec on Rt   Plantar fascia & toe extension stretch 2 x 20 sec on Rt     06/06/2023 Initial Evaluation & HEP created    PATIENT EDUCATION:  Education details: POC; HEP Person educated: Patient Education method: Programmer, multimedia, Demonstration, and Handouts Education comprehension: verbalized understanding, returned demonstration, and needs further education  HOME EXERCISE PROGRAM: Access Code: [email or web address] URL: [email or web address] Date: 08/03/2023 Prepared by: Claude Manges  Exercises - Seated Ankle Pumps  - 1 x daily - 7 x weekly - 1 sets - 10 reps - Seated Ankle Circles  - 1 x daily - 7 x weekly - 1 sets - 10 reps - Long Sitting Ankle Eversion with Resistance  - 1 x daily - 7 x weekly - 2 sets - 10 reps - Long Sitting Ankle Inversion with Resistance  - 1 x daily - 7 x weekly - 2 sets - 10 reps - Heel Raises with Counter Support  - 1 x daily - 7 x weekly - 2 sets - 10 reps - Gastroc Stretch on Wall  - 1 x daily - 7 x weekly - 2 sets - 20-30 hold - Soleus Stretch on Wall  - 1 x daily - 7 x weekly - 2 sets -  20-30 hold - Sit to Stand Without Arm Support  - 1 x daily - 7 x weekly - 2 sets - 10 reps - Seated Self Great Toe Stretch  - 1 x daily - 7 x weekly - 2 sets - 20-30 hold - Seated Plantar Fascia Stretch  - 1 x daily - 7 x weekly - 2 sets - 20-30 hold - Seated Toe Flexion Extension PROM  - 1 x daily - 7 x weekly - 2 sets - 20-30 hold - Single Leg Stance  - 1 x daily - 7 x weekly - 2 sets - 20-30 hold - Forward T  - 1 x daily - 7 x weekly - 1 sets - 10 reps - Lateral Step Up  - 1 x daily - 7 x weekly - 1 sets - 10 reps - Step Up  - 1 x daily - 7 x weekly - 1 sets - 10 reps ASSESSMENT:  CLINICAL IMPRESSION: Dominique continues to verbalize increased ankle soreness. She verbalized not being compliant with HEP. Educated patient on the importance of staying consistent with home program. Reminded patient to call her doctor to discuss further imaging options due to her ongoing symotoms. Patient verbalized understanding. Incorporated more single leg balance activities and patient required visual and verbal cues for correct performance. Updated HEP to include exercise progressions. Patient did not verbalize any increased pain at end of treatment session. Patient will benefit from skilled PT to address the below impairments and improve overall function.     OBJECTIVE IMPAIRMENTS: Abnormal gait, decreased balance, difficulty walking, decreased strength, increased muscle spasms, impaired flexibility,  postural dysfunction, and pain.   ACTIVITY LIMITATIONS: standing, squatting, stairs, and transfers  PARTICIPATION LIMITATIONS: cleaning, laundry, shopping, and community activity  PERSONAL FACTORS: Age, Time since onset of injury/illness/exacerbation, and 1-2 comorbidities: HTN; poor fitness  are also affecting patient's functional outcome.   REHAB POTENTIAL: Good  CLINICAL DECISION MAKING: Stable/uncomplicated  EVALUATION COMPLEXITY: Low   GOALS: Goals reviewed with patient? Yes  SHORT TERM GOALS:  Target date: 07/18/2023  Patient will be independent with initial HEP. Baseline:  Goal status: INITIAL  2.  Patient will report > or = to 30% improvement in Rt ankle soreness since starting PT. Baseline:  Goal status: INITIAL  3.  Patient will be able to ascend/descend stairs in a reciprocal pattern. Baseline:  Goal status: INITIAL   LONG TERM GOALS: Target date: 08/29/2023  Patient will demonstrate independence in advanced HEP. Baseline:  Goal status: INITIAL  2.  Patient will report > or = to 70% improvement in Rt ankle soreness since starting PT. Baseline:  Goal status: INITIAL   3.  Patient will score > or = to 63 on FOTO for improved function. Baseline:  Goal status: INITIAL  4.  Patient will score < or = to 11 secs on 5STS for improved functional mobility. Baseline:  Goal status: INITIAL  5.  Patient will be able to walk around the mall with < or = to 2/10 Rt ankle pain. Baseline:  Goal status: INITIAL    PLAN:  PT FREQUENCY: every other week  PT DURATION: 12 weeks  PLANNED INTERVENTIONS: 97164- PT Re-evaluation, 97110-Therapeutic exercises, 97530- Therapeutic activity, [id number]- Neuromuscular re-education, 97535- Self Care, 96045- Manual therapy, [phone]- Gait training, [id number]- Canalith repositioning, [id number]- Aquatic Therapy, 97014- Electrical stimulation (unattended), [id number]- Electrical stimulation (manual), [id number]- Vasopneumatic device, [id number]- Ultrasound, [id number]- Traction (mechanical), [id number]- Ionotophoresis 4mg /ml Dexamethasone, Patient/Family education, Balance training, Stair training, Taping, Dry Needling, Joint mobilization, Joint manipulation, Spinal manipulation, Spinal mobilization, Scar mobilization, Vestibular training, Cryotherapy, and Moist heat  PLAN FOR NEXT SESSION: ask if pt contacted MD; assess HEP compliance; continue ankle stability and single leg balance   Claude Manges, PT 08/03/23 4:55 PM [facility] [address]  [address] [place], [place] [address] Phone # [phone] Fax [phone]

## 2023-08-08 DIAGNOSIS — H52223 Regular astigmatism, bilateral: Secondary | ICD-10-CM | POA: Diagnosis not present

## 2023-08-08 DIAGNOSIS — H35033 Hypertensive retinopathy, bilateral: Secondary | ICD-10-CM | POA: Diagnosis not present

## 2023-08-08 DIAGNOSIS — H5203 Hypermetropia, bilateral: Secondary | ICD-10-CM | POA: Diagnosis not present

## 2023-08-08 DIAGNOSIS — H2513 Age-related nuclear cataract, bilateral: Secondary | ICD-10-CM | POA: Diagnosis not present

## 2023-08-08 DIAGNOSIS — H25013 Cortical age-related cataract, bilateral: Secondary | ICD-10-CM | POA: Diagnosis not present

## 2023-08-08 DIAGNOSIS — H43813 Vitreous degeneration, bilateral: Secondary | ICD-10-CM | POA: Diagnosis not present

## 2023-08-31 ENCOUNTER — Ambulatory Visit: Payer: Medicare HMO | Attending: Family Medicine | Admitting: Physical Therapy

## 2023-08-31 DIAGNOSIS — M6281 Muscle weakness (generalized): Secondary | ICD-10-CM | POA: Insufficient documentation

## 2023-08-31 DIAGNOSIS — R252 Cramp and spasm: Secondary | ICD-10-CM | POA: Insufficient documentation

## 2023-08-31 DIAGNOSIS — M25571 Pain in right ankle and joints of right foot: Secondary | ICD-10-CM | POA: Insufficient documentation

## 2023-08-31 NOTE — Patient Instructions (Signed)
"  TAKE YOUR MEDS"  ? ?Acronym to reduce the body's inflammatory response ? ?1) MEDITATION: Mindfulness and meditation have been proven to reduce the brain's over-sensitization and stimulation ? ? ?2) EXERCISE: Activity pumps the muscles which in return washes those inflammatory cells away ? ? ?3)DIET: Eat healthy foods especially plants. Stay away from processed foods, especially sugar which has been proven to INCREASE pain levels! ? ? ?4)SLEEP: Prioritize sleep right now.  Avoid screens for the 30 minutes before bedtime.  A cool, dark room is best for sleeping.  Your body needs quality sleep for healing.   ?

## 2023-08-31 NOTE — Therapy (Signed)
 OUTPATIENT PHYSICAL THERAPY LOWER EXTREMITY TREATMENT/RECERTIFICATION   Patient Name: Christy Kirby MRN: [id number] DOB:1950/09/20, 73 y.o., female Today's Date: 08/31/2023  END OF SESSION:  PT End of Session - 08/31/23 1628     Visit Number 4    Date for PT Re-Evaluation 11/23/23    Authorization Type [employer] will submit for auth    Authorization Time Period 06/06/2023-08/29/2023    Progress Note Due on Visit 10    PT Start Time 1628   late   PT Stop Time 1708    PT Time Calculation (min) 40 min    Activity Tolerance Patient tolerated treatment well               Past Medical History:  Diagnosis Date   Arthritis    hip   GERD (gastroesophageal reflux disease)    Hyperlipidemia    Hypertension    Intractable nausea and vomiting 06/07/2022   Past Surgical History:  Procedure Laterality Date   BIOPSY  06/08/2022   Procedure: BIOPSY;  Surgeon: Lemar Lofty., MD;  Location: [facility];  Service: Gastroenterology;;   COLONOSCOPY  06/26/2017   per Dr. Marina Goodell, benign polyps, repeat in 10 yrs    ESOPHAGOGASTRODUODENOSCOPY (EGD) WITH PROPOFOL N/A 06/08/2022   Procedure: ESOPHAGOGASTRODUODENOSCOPY (EGD) WITH PROPOFOL;  Surgeon: Lemar Lofty., MD;  Location: [facility];  Service: Gastroenterology;  Laterality: N/A;   NEUROPLASTY / TRANSPOSITION MEDIAN NERVE AT CARPAL TUNNEL BILATERAL     POLYPECTOMY     TOTAL HIP ARTHROPLASTY Right 01/11/2018   Procedure: RIGHT TOTAL HIP ARTHROPLASTY ANTERIOR APPROACH;  Surgeon: Samson Frederic, MD;  Location: [facility];  Service: Orthopedics;  Laterality: Right;   XI ROBOTIC ASSISTED SMALL BOWEL RESECTION N/A 10/07/2022   Procedure: XI ROBOTIC ASSISTED SMALL BOWEL RESECTION, BILATERAL TAP BLOCK, ASSESSMENT OF TISSUE PERFUSSION VIA FIREFLY INJECTION;  Surgeon: Karie Soda, MD;  Location: [facility];  Service: General;  Laterality: N/A;   Patient Active Problem List   Diagnosis Date Noted   SBO (small bowel obstruction)  (HCC) 11/09/2022   Malignant carcinoid tumor of ileum (HCC) 10/24/2022   Helicobacter pylori gastritis 10/12/2022   Ileal mass s/p robotic SB resection 10/07/2022 10/07/2022   History of abdominal pain 09/06/2022   Gastroenteritis 06/10/2022   Acute gastritis 06/09/2022   Hypokalemia 06/08/2022   Hypomagnesemia 06/08/2022   Intractable nausea and vomiting 06/07/2022   Hypertensive urgency 06/06/2022   Abdominal pain with vomiting 06/05/2022   HTN (hypertension) 09/30/2020   Osteoarthritis of right hip 01/11/2018   Hyperlipemia, mixed 02/08/2017   HIP PAIN, BILATERAL 04/07/2009   BACK PAIN, LUMBAR 04/07/2009   GERD 03/19/2007    PCP: Nelwyn Salisbury, MD  REFERRING PROVIDER: Rodolph Bong, MD  REFERRING DIAG: [phone] (ICD-10-CM) - Chronic pain of right ankle  THERAPY DIAG:  Pain in right ankle and joints of right foot  Muscle weakness (generalized)  Cramp and spasm  Rationale for Evaluation and Treatment: Rehabilitation  ONSET DATE: 3 mos ago  SUBJECTIVE:   SUBJECTIVE STATEMENT: Patient reports she has not been able to do the ex's.  She has been busy/overwhelmed with taking care of her daughter who had a stroke. Considering getting a shot.    From Eval: Patient presents with Rt ankle pain that began three months ago. When she wakes up in the morning she has to limp to the bathroom. She has increased ankle soreness when she stands up after stilling for a while and with ascending & descending stairs. She has  to take one step at a time. She does not normally exercises but she does a lot of moving around at her house. She wants to prevent getting a shot in her ankle.   Increased walking (around the mall) is tiring for her.  PERTINENT HISTORY: HTN, Hx Rt total hip arthroplasty   PAIN: 07/06/2023 Are you having pain? Yes: NPRS scale: 2 soreness Pain location: deep in anterior talocrural Joint  Pain description: soreness Aggravating factors: sit to stand transfer;  releasing foot from gas pedal; ascending/ descending stairs Relieving factors: Nothing  PRECAUTIONS: None  RED FLAGS: None   WEIGHT BEARING RESTRICTIONS: No  FALLS:  Has patient fallen in last 6 months? 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  DIAGNOSTIC FINDINGS:  IMPRESSION: There is a 10 mm subcortical lucency of the medial talar dome. This likely reflects an osteochondral lesion.  PATIENT SURVEYS:  FOTO 49 goal 63 3/27:  66%  COGNITION: Overall cognitive status: Within functional limits for tasks assessed     SENSATION: WFL   POSTURE: rounded shoulders and forward head  PALPATION: Increased muscle spasms of bilateral gastroc Increased tenderness with AP Grade II to Rt talocrural   LOWER EXTREMITY ROM: WFL bilateral ; increased pain with Rt ankle DF   LOWER EXTREMITY MMT: Grossly 4/5 bilateral     FUNCTIONAL TESTS:  5 times sit to stand: 14.39 sec no UE support Timed up and go (TUG): 9.347 Single Leg Balance Rt:23.31 Lt:16.11sec  3/27: 5x/ STS 14.09 SLS right 7.44  GAIT:  Comments:  occasional decreased Lt step length when standing                                                                                                                                TREATMENT DATE:  08/31/23: FOTO 5x STS Focused ex plan secondary to limited time: 1) SLS (added to HEP) 2) Single leg heel raise (added to HEP) 3) before getting out of bed in the am or after  prolonged sitting all toes fascial stretch 30 sec (added to HEP) 4) before getting out of bed seated calf stretch with strap (added to HEP) Self care strategies (given pt instruction handout): Meditation Exercise (walking) Eat healthy Prioritize sleep   08/03/2023 NuStep Level 3 6 mins- PT present to discuss status Gastroc stretch on rocker board  2 x 30 sec  Airex step ups x 10 each min/mod UE support Airex lateral step ups x 10 each with UE support Single  leg balance + cone taps (cone on counter) x 10 taps each leg Single leg balance + cone taps (cone on plinth) x 10 taps each leg Heel raises holding 5# DB x 20 Forward lunges on BOSU x 10 each Single leg balance + cone tap ( cones forwards, sideways, back) x8 each Forward T x 10 each direction TA raises leaning against wall 2 x 10 Rocker board (m/l) (a/p) x 2 mins each direction    07/06/2023 Recumbent Bike Level 1 5 mins- PT present to discuss status Seated ankle pumps x 10 Seated ankle circles x 7 each direction Ankle Eversion green TB 2 x 10 Ankle Inversion green TB 2 x 10 Gastroc stretch on wall 2 x 30 sec on Rt  Soleus stretch on wall 2 x 30 sec on Rt  Heel raises 2 x 10 Marching on airex x 20  Airex step ups + knee drive x 20 Lateral airex step ups x 20 Rocker board (m/l) (a/p) x 2 mins each direction Side stepping with blue loop around feet x 4 laps Sit to stand + heel raise 2 x 10 Great toe stretch 2 x 20 sec on Rt   Plantar fascia & toe extension stretch 2 x 20 sec on Rt     06/06/2023 Initial Evaluation & HEP created    PATIENT EDUCATION:  Education details: POC; HEP 3/27 see pt instructions handout Person educated: Patient Education method: Explanation, Demonstration, and Handouts Education comprehension: verbalized understanding, returned demonstration, and needs further education  HOME EXERCISE PROGRAM: Access Code: [email or web address] URL: [email or web address] Date: 08/31/2023 Prepared by:  Lavinia Sharps  Exercises - Seated Ankle Pumps  - 1 x daily - 7 x weekly - 1 sets - 10 reps - Seated Ankle Circles  - 1 x daily - 7 x weekly - 1 sets - 10 reps - Long Sitting Ankle Eversion with Resistance  - 1 x daily - 7 x weekly - 2 sets - 10 reps - Long Sitting Ankle Inversion with Resistance  - 1 x daily - 7 x weekly - 2 sets - 10 reps - Heel Raises with Counter Support  - 1 x daily - 7 x weekly - 2 sets - 10 reps - Gastroc Stretch on Wall  - 1 x daily - 7 x weekly - 2 sets - 20-30 hold - Soleus Stretch on Wall  - 1 x daily - 7 x weekly - 2 sets - 20-30 hold - Sit to Stand Without Arm Support  - 1 x daily - 7 x weekly - 2 sets - 10 reps - Seated Plantar Fascia Stretch  - 1 x daily - 7 x weekly - 2 sets - 20-30 hold - Seated Toe Flexion Extension PROM  - 1 x daily - 7 x weekly - 2 sets - 20-30 hold - Single Leg Stance  - 1 x daily - 7 x weekly - 2 sets - 20-30 hold - Forward T  - 1 x daily - 7 x weekly - 1 sets - 10 reps - Lateral Step Up  - 1 x daily - 7 x weekly - 1 sets - 10 reps - Step Up  - 1 x daily - 7 x weekly - 1 sets - 10 reps - Single Leg Stance with Support (Mirrored)  - 1 x daily - 7 x weekly - 1 sets - 5 reps - Single Leg Heel Raise with Counter Support (Mirrored)  - 1 x daily - 7 x weekly - 1  sets - 10 reps - Seated Self Great Toe Stretch  - 1 x daily - 7 x weekly - 2 sets - 30 hold - Seated Calf Stretch with Strap  - 1 x daily - 7 x weekly - 1 sets - 2 reps - 30 hold ASSESSMENT:  CLINICAL IMPRESSION: Izabel returns after a gap in care.  She has been overwhelmed caring for her daughter who has had a stroke and not able to do her exercises consistently.  We discussed a basic program of 4 ex's as well as self care strategies to promote healing and she was given handouts on topics discussed.  No follow up appts at this time.  She agreed to call within 6 weeks if needed.  If she has not called within that time will discharge from PT.      OBJECTIVE IMPAIRMENTS: Abnormal  gait, decreased balance, difficulty walking, decreased strength, increased muscle spasms, impaired flexibility, postural dysfunction, and pain.   ACTIVITY LIMITATIONS: standing, squatting, stairs, and transfers  PARTICIPATION LIMITATIONS: cleaning, laundry, shopping, and community activity  PERSONAL FACTORS: Age, Time since onset of injury/illness/exacerbation, and 1-2 comorbidities: HTN; poor fitness  are also affecting patient's functional outcome.   REHAB POTENTIAL: Good  CLINICAL DECISION MAKING: Stable/uncomplicated  EVALUATION COMPLEXITY: Low   GOALS: Goals reviewed with patient? Yes  SHORT TERM GOALS: Target date: 07/18/2023  Patient will be independent with initial HEP. Baseline:  Goal status: partially met  2.  Patient will report > or = to 30% improvement in Rt ankle soreness since starting PT. Baseline:  Goal status: ongoing  3.  Patient will be able to ascend/descend stairs in a reciprocal pattern. Baseline:  Goal status: ongoing   LONG TERM GOALS: Target date: 11/23/2023  Patient will demonstrate independence in advanced HEP. Baseline:  Goal status: ongoing  2.  Patient will report > or = to 70% improvement in Rt ankle soreness since starting PT. Baseline:  Goal status: ongoing  3.  Patient will score > or = to 63 on FOTO for improved function. Baseline:  Goal status: goal met 3/27  4.  Patient will score < or = to 11 secs on 5STS for improved functional mobility. Baseline:  Goal status: ongoing 5.  Patient will be able to walk around the mall with < or = to 2/10 Rt ankle pain. Baseline:  Goal status: ongoing    PLAN:  PT FREQUENCY: every other week  PT DURATION: 12 weeks  PLANNED INTERVENTIONS: 97164- PT Re-evaluation, 97110-Therapeutic exercises, 97530- Therapeutic activity, [id number]- Neuromuscular re-education, 97535- Self Care, 78295- Manual therapy, [id number]- Gait training, [phone]- Canalith repositioning, [id number]- Aquatic Therapy, 97014- Electrical  stimulation (unattended), [id number]- Electrical stimulation (manual), [id number]- Vasopneumatic device, [id number]- Ultrasound, [id number]- Traction (mechanical), [id number]- Ionotophoresis 4mg /ml Dexamethasone, Patient/Family education, Balance training, Stair training, Taping, Dry Needling, Joint mobilization, Joint manipulation, Spinal manipulation, Spinal mobilization, Scar mobilization, Vestibular training, Cryotherapy, and Moist heat  PLAN FOR NEXT SESSION: HEP update; pt to call within 6 weeks if further PT needed  Lavinia Sharps, PT 08/31/23 5:23 PM Phone: [phone] Fax: [phone] [facility] [address] [place], [place] [address] Phone # [phone] Fax [phone]

## 2023-09-14 ENCOUNTER — Encounter: Payer: Medicare HMO | Admitting: Physical Therapy

## 2023-09-28 ENCOUNTER — Encounter: Payer: Medicare HMO | Admitting: Physical Therapy

## 2023-11-28 DIAGNOSIS — H2511 Age-related nuclear cataract, right eye: Secondary | ICD-10-CM | POA: Diagnosis not present

## 2023-11-28 DIAGNOSIS — H2513 Age-related nuclear cataract, bilateral: Secondary | ICD-10-CM | POA: Diagnosis not present

## 2023-11-28 DIAGNOSIS — H25013 Cortical age-related cataract, bilateral: Secondary | ICD-10-CM | POA: Diagnosis not present

## 2023-11-28 DIAGNOSIS — H25043 Posterior subcapsular polar age-related cataract, bilateral: Secondary | ICD-10-CM | POA: Diagnosis not present

## 2023-11-28 DIAGNOSIS — H18413 Arcus senilis, bilateral: Secondary | ICD-10-CM | POA: Diagnosis not present

## 2023-12-16 ENCOUNTER — Other Ambulatory Visit: Payer: Self-pay | Admitting: Family Medicine

## 2023-12-19 ENCOUNTER — Ambulatory Visit (HOSPITAL_COMMUNITY)
Admission: RE | Admit: 2023-12-19 | Discharge: 2023-12-19 | Disposition: A | Discharge status: Home / Self Care | Source: Ambulatory Visit | Attending: Hematology | Admitting: Hematology

## 2023-12-19 ENCOUNTER — Other Ambulatory Visit: Payer: Medicare HMO

## 2023-12-19 DIAGNOSIS — C7A00 Malignant carcinoid tumor of unspecified site: Secondary | ICD-10-CM | POA: Diagnosis not present

## 2023-12-19 DIAGNOSIS — C7A012 Malignant carcinoid tumor of the ileum: Secondary | ICD-10-CM | POA: Diagnosis not present

## 2023-12-19 DIAGNOSIS — C7A8 Other malignant neuroendocrine tumors: Secondary | ICD-10-CM | POA: Diagnosis not present

## 2023-12-19 DIAGNOSIS — K8020 Calculus of gallbladder without cholecystitis without obstruction: Secondary | ICD-10-CM | POA: Diagnosis not present

## 2023-12-19 MED ORDER — IOHEXOL 9 MG/ML PO SOLN
ORAL | Status: AC
Start: 2023-12-19 — End: 2023-12-19
  Filled 2023-12-19: qty 1000

## 2023-12-19 MED ORDER — IOHEXOL 300 MG/ML  SOLN
100.0000 mL | Freq: Once | INTRAMUSCULAR | Status: AC | PRN
  Administered 2023-12-19: 100 mL via INTRAVENOUS

## 2023-12-19 MED ORDER — IOHEXOL 9 MG/ML PO SOLN
500.0000 mL | ORAL | Status: AC
  Administered 2023-12-19 (×2): 500 mL via ORAL

## 2023-12-25 ENCOUNTER — Other Ambulatory Visit: Payer: Self-pay | Admitting: Nurse Practitioner

## 2023-12-25 DIAGNOSIS — C7A012 Malignant carcinoid tumor of the ileum: Secondary | ICD-10-CM

## 2023-12-25 NOTE — Progress Notes (Signed)
 Patient Care Team: Johnny Garnette LABOR, MD as PCP - Diedre Sheldon Standing, MD as Consulting Physician (General Surgery) Abran Norleen SAILOR, MD as Consulting Physician (Gastroenterology) Lanny Callander, MD as Consulting Physician (Oncology)  Clinic Day:  12/26/2023  Referring physician: Johnny Garnette LABOR, MD  ASSESSMENT & PLAN:   Assessment & Plan: Malignant carcinoid tumor of ileum [facility]) pT4N1M0 stage III -diagnosed in 10/2022 -Underwent ileal resection by Dr. Sheldon on 10/07/2022.  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 Ki-67 1%. Also mesenteric tumor deposit measuring 2.7 cm was resected.  Staged as a T4, N1.  -Dotatate scan showed single focus (8 mm) of radiotracer activity in the ventral peritoneal space of the mid right abdomen.  -Discussed with Dr. Sheldon who feels that surgery may put the small bowel blood supply at risk, so we decided to observe with annual scan and office visit with lab every 6 months - 12/19/2023 -CT abdomen pelvis.  Showing that previously noted mesenteric node not currently identified.  Was better evaluated on most recent PET/CT.  There was no new evidence of enlarging masses.  No evidence of recurrence or metastatic disease.  Plan for annual surveillance scan in July 2026. - 12/26/2023 -clinical exam is benign.  Chromogranin a is normal. Plan to repeat labs and follow-up in 6 months, sooner if needed.   Plan Labs reviewed. - Stable, very mild anemia with Hgb 12.1 and HCT 36.0.  Mild neutropenia with WBC 3.6 and ANC 2.0. -CMP unremarkable. - Normal chromogranin A at 46.7. Reviewed recent CT abdomen pelvis done 12/19/2023. -The previously noted mesenteric nodes not clearly identified on the scan.  Better evaluated on most recent PET/CT.  No new enlarging masses.  No evidence of recurrence or metastatic disease. - Plan to continue with annual surveillance scans. Labs and follow-up in 6 months, sooner if  needed.   The patient understands the plans discussed today and is in agreement with them.  She knows to contact our office if she develops concerns prior to her next appointment.  I provided 25 minutes of face-to-face time during this encounter and > 50% was spent counseling as documented under my assessment and plan.    Powell FORBES Lessen, NP  [facility] - A DEPT OF [facility] [address] [place] [address] Dept: [phone] Dept Fax: [phone]   No orders of the defined types were placed in this encounter.     CHIEF COMPLAINT:  CC: Malignant carcinoid tumor of ileum  Current Treatment: Observation  INTERVAL HISTORY:  Christy Kirby is here today for repeat clinical assessment.  She last saw Dr. Lanny on 06/26/2023.  She has no new concerns or complaints today.  She states her appetite has been good.  She denies diarrhea or nausea.  Denies abdominal pain.  Has had no episodes of flushing.  She denies chest pain, chest pressure, or shortness of breath. She denies headaches or visual disturbances. She denies fevers or chills. She denies pain. Her weight has increased 5 pounds over last 6 months.  I have reviewed the past medical history, past surgical history, social history and family history with the patient and they are unchanged from previous note.  ALLERGIES:  is allergic to codeine.  MEDICATIONS:  Current Outpatient Medications  Medication Sig Dispense Refill   acetaminophen  (TYLENOL ) 325 MG tablet Take 2 tablets (650 mg total) by mouth every 6 (six) hours as  needed for mild pain or headache.     ibuprofen  (ADVIL ) 800 MG tablet Take 1 tablet (800 mg total) by mouth every 8 (eight) hours as needed for moderate pain (pain score 4-6). 60 tablet 5   metoprolol  tartrate (LOPRESSOR ) 25 MG tablet TAKE 1/2 TABLET TWICE A DAY BY MOUTH 90 tablet 3   No current facility-administered medications for this visit.     HISTORY OF PRESENT ILLNESS:   Oncology History  Malignant carcinoid tumor of ileum (HCC)  06/07/2022 Imaging   CT ABDOMEN PELVIS W CONTRAST   IMPRESSION: 1. Stranding in the ileal mesentery surrounding a low-density area in hindsight as seen on the prior study. This low-density area measures 19 x 17 mm. There is suggestion of some peripheral and nodular enhancement on coronal images. Findings are nonspecific and are of uncertain significance. Differential diagnosis would include Meckel's diverticulitis or cystic/necrotic lymph node or nodal metastasis in the ileal mesentery. Given nodular enhancement at the periphery underlying neoplasm would be difficult to exclude. Interloop abscess is felt less likely but given stranding not entirely excluded. Would also correlate with any signs of history of GI bleeding. Surgical consultation may be helpful. 2. No signs of pneumatosis or other secondary signs of bowel compromise. 3. Cholelithiasis without evidence of acute cholecystitis. 4. Trace free fluid in the pelvis, of uncertain significance. 5. Uterus with multiple leiomyomata, not well assessed on CT due to streak artifact from RIGHT hip arthroplasty. 6. Aortic atherosclerosis.   Aortic Atherosclerosis (ICD10-I70.0).     Electronically Signed   By: Isla Blind M.D.   On: 06/07/2022 11:47   07/14/2022 Imaging   CT ENTERO ABD/PELVIS W CONTAST   IMPRESSION: 11 mm distal small bowel lesion, suspicious for small bowel carcinoid.   Associated ileal mesenteric nodes correspond to suspected mesenteric carcinoid.   Cholelithiasis, without associated inflammatory changes.   10/07/2022 Procedure   ROBOTIC ILEAL RESECTION by Dr. Sheldon    10/07/2022 Initial Diagnosis   Well-differentiated neuroendocrine tumor (G1; low-grade) of the ileum    10/07/2022 Pathology Results   SURGICAL PATHOLOGY  CASE: [id number]   B. ILEUM MASS, RESECTION:  - Well-differentiated neuroendocrine  tumor (G1; low-grade), 1.5 cm,  involving ileum  - Tumor involves the serosal surface  - Resection margins are negative for tumor  - Metastatic tumor to two of three lymph nodes (2/3)  - Mesenteric tumor deposit, 2.7 cm  - See oncology table    10/07/2022 Cancer Staging   Staging form: Neuroendocrine Tumor - Jejunum/Ileum, AJCC V9 - Pathologic stage from 10/07/2022: Stage III (pT4, pN1, cM0) - Signed by Lanny Callander, MD on 06/25/2023 Histologic grade (G): G1 Histologic grading system: 3 grade system Residual tumor (R): R0 - None   11/04/2022 Initial Diagnosis   Carcinoid tumor of ileum       REVIEW OF SYSTEMS:   Constitutional: Denies fevers, chills or abnormal weight loss. Mild fatigue.  Eyes: Denies blurriness of vision Ears, nose, mouth, throat, and face: Denies mucositis or sore throat Respiratory: Denies cough, dyspnea or wheezes Cardiovascular: Denies palpitation, chest discomfort or lower extremity swelling Gastrointestinal:  Denies nausea, heartburn or change in bowel habits Skin: Denies abnormal skin rashes Lymphatics: Denies new lymphadenopathy or easy bruising Neurological:Denies numbness, tingling or new weaknesses Behavioral/Psych: Mood is stable, no new changes  All other systems were reviewed with the patient and are negative.   VITALS:   Today's Vitals   12/26/23 0959 12/26/23 1018  BP: 120/80   Pulse: 78   Resp:  17   Temp: 97.8 F (36.6 C)   SpO2: 98%   Weight: 151 lb 8 oz (68.7 kg)   PainSc:  0-No pain   Body mass index is 25.6 kg/m.    Wt Readings from Last 3 Encounters:  12/26/23 151 lb 8 oz (68.7 kg)  06/26/23 146 lb 6.4 oz (66.4 kg)  05/16/23 143 lb (64.9 kg)    Body mass index is 25.6 kg/m.  Performance status (ECOG): 1 - Symptomatic but completely ambulatory  PHYSICAL EXAM:   GENERAL:alert, no distress and comfortable SKIN: skin color, texture, turgor are normal, no rashes or significant lesions EYES: normal, Conjunctiva are pink and  non-injected, sclera clear OROPHARYNX:no exudate, no erythema and lips, buccal mucosa, and tongue normal  NECK: supple, thyroid  normal size, non-tender, without nodularity LYMPH:  no palpable lymphadenopathy in the cervical, axillary or inguinal LUNGS: clear to auscultation and percussion with normal breathing effort HEART: regular rate & rhythm and no murmurs and no lower extremity edema ABDOMEN:abdomen soft, non-tender and normal bowel sounds Musculoskeletal:no cyanosis of digits and no clubbing  NEURO: alert & oriented x 3 with fluent speech, no focal motor/sensory deficits  LABORATORY DATA:  I have reviewed the data as listed    Component Value Date/Time   NA 142 12/26/2023 0944   K 3.6 12/26/2023 0944   CL 107 12/26/2023 0944   CO2 30 12/26/2023 0944   GLUCOSE 75 12/26/2023 0944   BUN 13 12/26/2023 0944   CREATININE 0.91 12/26/2023 0944   CALCIUM  9.1 12/26/2023 0944   PROT 7.2 12/26/2023 0944   ALBUMIN 3.8 12/26/2023 0944   AST 14 (L) 12/26/2023 0944   ALT 6 12/26/2023 0944   ALKPHOS 48 12/26/2023 0944   BILITOT 0.4 12/26/2023 0944   GFRNONAA >60 12/26/2023 0944   GFRAA >60 01/12/2018 0516     Lab Results  Component Value Date   WBC 3.6 (L) 12/26/2023   NEUTROABS 2.0 12/26/2023   HGB 12.1 12/26/2023   HCT 36.0 12/26/2023   MCV 90.5 12/26/2023   PLT 237 12/26/2023     RADIOGRAPHIC STUDIES: CT ABDOMEN PELVIS W CONTRAST Result Date: 12/23/2023 CLINICAL DATA:  Neuroendocrine tumor, malignant carcinoid tumor of the ileum EXAM: CT ABDOMEN AND PELVIS WITH CONTRAST TECHNIQUE: Multidetector CT imaging of the abdomen and pelvis was performed using the standard protocol following bolus administration of intravenous contrast. RADIATION DOSE REDUCTION: This exam was performed according to the departmental dose-optimization program which includes automated exposure control, adjustment of the mA and/or kV according to patient size and/or use of iterative reconstruction technique.  CONTRAST:  OMNIPAQUE  IOHEXOL  300 MG/ML  SOLN COMPARISON:  November 23, 2022 PET-CT FINDINGS: Lower chest: Unremarkable. Hepatobiliary: No suspicious liver lesions.  Cholelithiasis. Pancreas: Unremarkable. Spleen: Unremarkable. Adrenals/Urinary Tract: Adrenal glands are unremarkable. No nephrolithiasis or hydronephrosis. No suspicious kidney lesions. Stomach/Bowel: No evidence of bowel obstruction or inflammation. Sequelae of prior ileocecectomy. Vascular/Lymphatic: Normal caliber aorta. No lymphadenopathy by size criteria. Reproductive: Likely multi fibroid uterus. No adnexal masses. Previously identified mesenteric nodule in the lower abdomen is not well visualized on this study and better detected on prior PET-CT. Other: No free air or free fluid. Musculoskeletal: Right hip arthroplasty with adjacent streak artifact, slightly limiting evaluation. No acute osseous abnormalities. IMPRESSION: Previously identified radiotracer avid mesenteric nodule in the lower abdomen is not definitively identified on this study but was better evaluated on prior PET-CT. No new or enlarging disease in the abdomen or pelvis. Electronically Signed   By: Michaeline  Patel M.D.   On: 12/23/2023 12:11

## 2023-12-25 NOTE — Assessment & Plan Note (Addendum)
 pT4N1M0 stage III -diagnosed in 10/2022 -Underwent ileal resection by Dr. Sheldon on 10/07/2022.  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 Ki-67 1%. Also mesenteric tumor deposit measuring 2.7 cm was resected.  Staged as a T4, N1.  -Dotatate scan showed single focus (8 mm) of radiotracer activity in the ventral peritoneal space of the mid right abdomen.  -Discussed with Dr. Sheldon who feels that surgery may put the small bowel blood supply at risk, so we decided to observe with annual scan and office visit with lab every 6 months - 12/19/2023 -CT abdomen pelvis.  Showing that previously noted mesenteric node not currently identified.  Was better evaluated on most recent PET/CT.  There was no new evidence of enlarging masses.  No evidence of recurrence or metastatic disease.  Plan for annual surveillance scan in July 2026. - 12/26/2023 -clinical exam is benign.  Chromogranin a is normal. Plan to repeat labs and follow-up in 6 months, sooner if needed.

## 2023-12-26 ENCOUNTER — Other Ambulatory Visit: Payer: Self-pay

## 2023-12-26 ENCOUNTER — Telehealth: Payer: Self-pay | Admitting: Nurse Practitioner

## 2023-12-26 ENCOUNTER — Inpatient Hospital Stay: Payer: Medicare HMO | Admitting: Nurse Practitioner

## 2023-12-26 ENCOUNTER — Inpatient Hospital Stay: Payer: Medicare HMO | Attending: Nurse Practitioner

## 2023-12-26 VITALS — BP 120/80 | HR 78 | Temp 97.8°F | Resp 17 | Wt 151.5 lb

## 2023-12-26 DIAGNOSIS — Z8589 Personal history of malignant neoplasm of other organs and systems: Secondary | ICD-10-CM | POA: Diagnosis not present

## 2023-12-26 DIAGNOSIS — C7A012 Malignant carcinoid tumor of the ileum: Secondary | ICD-10-CM | POA: Diagnosis not present

## 2023-12-26 DIAGNOSIS — D649 Anemia, unspecified: Secondary | ICD-10-CM | POA: Insufficient documentation

## 2023-12-26 LAB — CMP (CANCER CENTER ONLY)
ALT: 6 U/L (ref 0–44)
AST: 14 U/L — ABNORMAL LOW (ref 15–41)
Albumin: 3.8 g/dL (ref 3.5–5.0)
Alkaline Phosphatase: 48 U/L (ref 38–126)
Anion gap: 5 (ref 5–15)
BUN: 13 mg/dL (ref 8–23)
CO2: 30 mmol/L (ref 22–32)
Calcium: 9.1 mg/dL (ref 8.9–10.3)
Chloride: 107 mmol/L (ref 98–111)
Creatinine: 0.91 mg/dL (ref 0.44–1.00)
GFR, Estimated: >60 mL/min (ref >60)
Glucose, Bld: 75 mg/dL (ref 70–99)
Potassium: 3.6 mmol/L (ref 3.5–5.1)
Sodium: 142 mmol/L (ref 135–145)
Total Bilirubin: 0.4 mg/dL (ref 0.0–1.2)
Total Protein: 7.2 g/dL (ref 6.5–8.1)

## 2023-12-26 LAB — CBC WITH DIFFERENTIAL (CANCER CENTER ONLY)
Abs Immature Granulocytes: 0.01 K/uL (ref 0.00–0.07)
Basophils Absolute: 0 K/uL (ref 0.0–0.1)
Basophils Relative: 1 %
Eosinophils Absolute: 0.1 K/uL (ref 0.0–0.5)
Eosinophils Relative: 2 %
HCT: 36 % (ref 36.0–46.0)
Hemoglobin: 12.1 g/dL (ref 12.0–15.0)
Immature Granulocytes: 0 %
Lymphocytes Relative: 33 %
Lymphs Abs: 1.2 K/uL (ref 0.7–4.0)
MCH: 30.4 pg (ref 26.0–34.0)
MCHC: 33.6 g/dL (ref 30.0–36.0)
MCV: 90.5 fL (ref 80.0–100.0)
Monocytes Absolute: 0.3 K/uL (ref 0.1–1.0)
Monocytes Relative: 7 %
Neutro Abs: 2 K/uL (ref 1.7–7.7)
Neutrophils Relative %: 57 %
Platelet Count: 237 K/uL (ref 150–400)
RBC: 3.98 MIL/uL (ref 3.87–5.11)
RDW: 12.6 % (ref 11.5–15.5)
WBC Count: 3.6 K/uL — ABNORMAL LOW (ref 4.0–10.5)
nRBC: 0 % (ref 0.0–0.2)

## 2023-12-26 NOTE — Telephone Encounter (Signed)
 Scheduled appointments per 7/22 los. Called and left a VM with appointment details for the patient.

## 2023-12-28 ENCOUNTER — Ambulatory Visit: Payer: Medicare HMO | Admitting: Hematology

## 2023-12-28 LAB — CHROMOGRANIN A: Chromogranin A (ng/mL): 46.7 ng/mL (ref 0.0–101.8)

## 2023-12-31 ENCOUNTER — Encounter: Payer: Self-pay | Admitting: Nurse Practitioner

## 2024-01-31 DIAGNOSIS — H2511 Age-related nuclear cataract, right eye: Secondary | ICD-10-CM | POA: Diagnosis not present

## 2024-02-01 DIAGNOSIS — H2512 Age-related nuclear cataract, left eye: Secondary | ICD-10-CM | POA: Diagnosis not present

## 2024-02-01 DIAGNOSIS — H2511 Age-related nuclear cataract, right eye: Secondary | ICD-10-CM | POA: Diagnosis not present

## 2024-02-14 DIAGNOSIS — H2512 Age-related nuclear cataract, left eye: Secondary | ICD-10-CM | POA: Diagnosis not present

## 2024-02-14 DIAGNOSIS — H25812 Combined forms of age-related cataract, left eye: Secondary | ICD-10-CM | POA: Diagnosis not present

## 2024-03-12 DIAGNOSIS — Z1231 Encounter for screening mammogram for malignant neoplasm of breast: Secondary | ICD-10-CM | POA: Diagnosis not present

## 2024-03-12 DIAGNOSIS — Z01419 Encounter for gynecological examination (general) (routine) without abnormal findings: Secondary | ICD-10-CM | POA: Diagnosis not present

## 2024-03-12 DIAGNOSIS — Z6825 Body mass index (BMI) 25.0-25.9, adult: Secondary | ICD-10-CM | POA: Diagnosis not present

## 2024-03-26 DIAGNOSIS — N958 Other specified menopausal and perimenopausal disorders: Secondary | ICD-10-CM | POA: Diagnosis not present

## 2024-03-26 DIAGNOSIS — M8588 Other specified disorders of bone density and structure, other site: Secondary | ICD-10-CM | POA: Diagnosis not present

## 2024-04-09 ENCOUNTER — Ambulatory Visit: Admitting: Family Medicine

## 2024-04-09 DIAGNOSIS — Z0000 Encounter for general adult medical examination without abnormal findings: Secondary | ICD-10-CM

## 2024-04-09 NOTE — Patient Instructions (Signed)
 I really enjoyed getting to talk with you today! I am available on Tuesdays and Thursdays for virtual visits if you have any questions or concerns, or if I can be of any further assistance.   CHECKLIST FROM ANNUAL WELLNESS VISIT:  -Follow up (please call to schedule if not scheduled after visit):   -yearly for annual wellness visit with primary care office  Here is a list of your preventive care/health maintenance measures and the plan for each if any are due:  PLAN For any measures below that may be due:    1. Please bring copy of bone density to Dr. Johnny   2. Can get vaccines at the pharmacy if you change you mind.    3. Can get hepatitis C screening when you come for you in office visit.   Health Maintenance  Topic Date Due   Hepatitis C Screening  Never done   COVID-19 Vaccine (4 - 2025-26 season) 04/25/2024 (Originally 02/05/2024)   Zoster Vaccines- Shingrix (1 of 2) 07/10/2024 (Originally 06/12/1969)   Influenza Vaccine  09/03/2024 (Originally 01/05/2024)   DTaP/Tdap/Td (1 - Tdap) 04/09/2025 (Originally 06/12/1969)   Medicare Annual Wellness (AWV)  04/09/2025   Colonoscopy  06/27/2027   Pneumococcal Vaccine: 50+ Years  Completed   DEXA SCAN  Completed   Meningococcal B Vaccine  Aged Out   Mammogram  Discontinued    -See a dentist at least yearly  -Get your eyes checked and then per your eye specialist's recommendations   -I have included below further information regarding a healthy whole foods based diet, physical activity guidelines for adults, stress management and opportunities for social connections. I hope you find this information useful.   -----------------------------------------------------------------------------------------------------------------------------------------------------------------------------------------------------------------------------------------------------------    NUTRITION: -eat real food: lots of colorful vegetables (half the plate) and  fruits -5-7 servings of vegetables and fruits per day (fresh or steamed is best), exp. 2 servings of vegetables with lunch and dinner and 2 servings of fruit per day. Berries and greens such as kale and collards are great choices.  -consume on a regular basis:  fresh fruits, fresh veggies, fish, nuts, seeds, healthy oils (such as olive oil, avocado oil), whole grains (make sure for bread/pasta/crackers/etc., that the first ingredient on label contains the word whole), legumes. -can eat small amounts of dairy and lean meat (no larger than the palm of your hand), but avoid processed meats such as ham, bacon, lunch meat, etc. -drink water  -try to avoid fast food and pre-packaged foods, processed meat, ultra processed foods/beverages (donuts, candy, etc.) -most experts advise limiting sodium to < 2300mg  per day, should limit further is any chronic conditions such as high blood pressure, heart disease, diabetes, etc. The American Heart Association advised that < 1500mg  is is ideal -try to avoid foods/beverages that contain any ingredients with names you do not recognize  -try to avoid foods/beverages  with added sugar or sweeteners/sweets  -try to avoid sweet drinks (including diet drinks): soda, juice, Gatorade, sweet tea, power drinks, diet drinks -try to avoid white rice, white bread, pasta (unless whole grain)  EXERCISE GUIDELINES FOR ADULTS: -if you wish to increase your physical activity, do so gradually and with the approval of your doctor -STOP and seek medical care immediately if you have any chest pain, chest discomfort or trouble breathing when starting or increasing exercise  -move and stretch your body, legs, feet and arms when sitting for long periods -Physical activity guidelines for optimal health in adults: -get at least 150 minutes per week  of moderate exercise (can talk, but not sing); this is about 20-30 minutes of sustained activity 5-7 days per week or two 10-15 minute episodes of  sustained activity 5-7 days per week -do some muscle building/resistance training/strength training at least 2 days per week  -balance exercises 3+ days per week:   Stand somewhere where you have something sturdy to hold onto if you lose balance    1) lift up on toes, then back down, start with 5x per day and work up to 20x   2) stand and lift one leg straight out to the side so that foot is a few inches of the floor, start with 5x each side and work up to 20x each side   3) stand on one foot, start with 5 seconds each side and work up to 20 seconds on each side  If you need ideas or help with getting more active:  -Silver sneakers https://tools.silversneakers.com  -Walk with a Doc: Http://www.duncan-williams.com/  -try to include resistance (weight lifting/strength building) and balance exercises twice per week: or the following link for ideas: http://castillo-powell.com/  buyducts.dk  STRESS MANAGEMENT: -can try meditating, or just sitting quietly with deep breathing while intentionally relaxing all parts of your body for 5 minutes daily -if you need further help with stress, anxiety or depression please follow up with your primary doctor or contact the wonderful folks at [facility]: [phone]  SOCIAL CONNECTIONS: -options in [place] if you wish to engage in more social and exercise related activities:  -Silver sneakers https://tools.silversneakers.com  -Walk with a Doc: Http://www.duncan-williams.com/  -Check out the [facility] Active Adults 50+ section on the [place] [employer] (hiking clubs, book clubs, cards and games, chess, exercise classes, aquatic classes and much more) - see the website for details: https://www.[place].gov/departments/parks-recreation/active-adults50  -YouTube has lots of exercise videos for different ages and abilities as well  -[facility] (a variety of indoor and outdoor inperson activities for adults). [phone]. [address]  -Virtual Online Classes (a variety of topics): see seniorplanet.org or call [phone]  -consider volunteering at a school, hospice center, church, senior center or elsewhere

## 2024-04-09 NOTE — Progress Notes (Signed)
 Because this visit was a virtual/telehealth visit, some criteria may be missing or patient reported. Any vitals not documented were not able to be obtained and vitals that have been documented are patient reported.    MEDICARE ANNUAL PREVENTIVE VISIT WITH PROVIDER: (Welcome to Medicare, initial annual wellness or annual wellness exam)  Virtual Visit via Phone Note  I connected with Christy Kirby on 04/09/24 by phone and verified that I am speaking with the correct person using two identifiers. She prefers phone visit.   Location patient: home Location provider:work or home office Persons participating in the virtual visit: patient, provider  Concerns and/or follow up today: see detailed intake and health assessment in flowsheets.    How often do you have a drink containing alcohol ?n How many drinks containing alcohol  do you have on a typical day when you are drinking?na How often do you have six or more drinks on one occasion?na Have you ever smoked?n Quit date if applicable? na  How many packs a day do/did you smoke? na Do you use smokeless tobacco?n Do you use an illicit drugs?n Do you feel safe at home?y Last dentist visit? Goes on a regular basis   Last eye Exam and location?Dr. Lavonia   See HM section in Epic for other details of completed HM.    ROS: negative for report of fevers, unintentional weight loss, vision changes, vision loss, hearing loss or change, chest pain, sob, hemoptysis, melena, hematochezia, hematuria, falls, bleeding or bruising, thoughts of suicide or self harm, memory loss  Patient-completed extensive health risk assessment - reviewed and discussed with the patient: See Health Risk Assessment completed with patient prior to the visit either above or in recent phone note. This was reviewed in detailed with the patient today and appropriate recommendations, orders and referrals were placed as needed per Summary below and patient  instructions.   Review of Medical History: -PMH, PSH, Family History and current specialty and care providers reviewed and updated and listed below   Patient Care Team: Johnny Garnette LABOR, MD as PCP - Diedre Sheldon Standing, MD as Consulting Physician (General Surgery) Abran Norleen SAILOR, MD as Consulting Physician (Gastroenterology) Lanny Callander, MD as Consulting Physician (Oncology) Dr. Lavonia as Attending Physician (Ophthalmology)   Past Medical History:  Diagnosis Date   Arthritis    hip   GERD (gastroesophageal reflux disease)    Hyperlipidemia    Hypertension    Intractable nausea and vomiting 06/07/2022    Past Surgical History:  Procedure Laterality Date   BIOPSY  06/08/2022   Procedure: BIOPSY;  Surgeon: Wilhelmenia Aloha Raddle., MD;  Location: [facility];  Service: Gastroenterology;;   COLONOSCOPY  06/26/2017   per Dr. Abran, benign polyps, repeat in 10 yrs    ESOPHAGOGASTRODUODENOSCOPY (EGD) WITH PROPOFOL  N/A 06/08/2022   Procedure: ESOPHAGOGASTRODUODENOSCOPY (EGD) WITH PROPOFOL ;  Surgeon: Wilhelmenia Aloha Raddle., MD;  Location: [facility];  Service: Gastroenterology;  Laterality: N/A;   NEUROPLASTY / TRANSPOSITION MEDIAN NERVE AT CARPAL TUNNEL BILATERAL     POLYPECTOMY     TOTAL HIP ARTHROPLASTY Right 01/11/2018   Procedure: RIGHT TOTAL HIP ARTHROPLASTY ANTERIOR APPROACH;  Surgeon: Fidel Rogue, MD;  Location: [facility];  Service: Orthopedics;  Laterality: Right;   XI ROBOTIC ASSISTED SMALL BOWEL RESECTION N/A 10/07/2022   Procedure: XI ROBOTIC ASSISTED SMALL BOWEL RESECTION, BILATERAL TAP BLOCK, ASSESSMENT OF TISSUE PERFUSSION VIA FIREFLY INJECTION;  Surgeon: Sheldon Standing, MD;  Location: [facility];  Service: General;  Laterality: N/A;    Social History  Socioeconomic History   Marital status: Married    Spouse name: Not on file   Number of children: Not on file   Years of education: Not on file   Highest education level: Not on file  Occupational History   Not on file   Tobacco Use   Smoking status: Former    Types: Cigarettes   Smokeless tobacco: Never   Tobacco comments:    only when [age] years old briefly   Vaping Use   Vaping status: Never Used  Substance and Sexual Activity   Alcohol  use: Not Currently    Comment: occ wine    Drug use: No   Sexual activity: Not Currently  Other Topics Concern   Not on file  Social History Narrative   Not on file   Social Drivers of Health   Financial Resource Strain: Low Risk  (04/27/2023)   Overall Financial Resource Strain (CARDIA)    Difficulty of Paying Living Expenses: Not very hard  Food Insecurity: No Food Insecurity (11/14/2022)   Hunger Vital Sign    Worried About Running Out of Food in the Last Year: Never true    Ran Out of Food in the Last Year: Never true  Transportation Needs: No Transportation Needs (11/14/2022)   PRAPARE - Administrator, Civil Service (Medical): No    Lack of Transportation (Non-Medical): No  Physical Activity: Insufficiently Active (04/09/2024)   Exercise Vital Sign    Days of Exercise per Week: 4 days    Minutes of Exercise per Session: 30 min  Stress: Stress Concern Present (04/09/2024)   [employer] of Occupational Health - Occupational Stress Questionnaire    Feeling of Stress: To some extent  Social Connections: Moderately Integrated (04/09/2024)   Social Connection and Isolation Panel    Frequency of Communication with Friends and Family: More than three times a week    Frequency of Social Gatherings with Friends and Family: More than three times a week    Attends Religious Services: More than 4 times per year    Active Member of [employer] or Organizations: Yes    Attends Banker Meetings: More than 4 times per year    Marital Status: Separated  Intimate Partner Violence: Not At Risk (11/09/2022)   Humiliation, Afraid, Rape, and Kick questionnaire    Fear of Current or Ex-Partner: No    Emotionally Abused: No    Physically Abused:  No    Sexually Abused: No    Family History  Problem Relation Age of Onset   Diabetes Sister    Colon cancer Neg Hx    Colon polyps Neg Hx    Esophageal cancer Neg Hx    Rectal cancer Neg Hx    Stomach cancer Neg Hx     Current Outpatient Medications on File Prior to Visit  Medication Sig Dispense Refill   acetaminophen  (TYLENOL ) 325 MG tablet Take 2 tablets (650 mg total) by mouth every 6 (six) hours as needed for mild pain or headache.     ibuprofen  (ADVIL ) 800 MG tablet Take 1 tablet (800 mg total) by mouth every 8 (eight) hours as needed for moderate pain (pain score 4-6). 60 tablet 5   metoprolol  tartrate (LOPRESSOR ) 25 MG tablet TAKE 1/2 TABLET TWICE A DAY BY MOUTH 90 tablet 3   No current facility-administered medications on file prior to visit.    Allergies  Allergen Reactions   Codeine Nausea Only       Physical  Exam Vitals requested from patient and listed below if patient had equipment and was able to obtain at home for this virtual visit: There were no vitals filed for this visit. Estimated body mass index is 25.6 kg/m as calculated from the following:   Height as of 05/16/23: 5' 4.5 (1.638 m).   Weight as of 12/26/23: 151 lb 8 oz (68.7 kg).  EKG (optional): deferred due to virtual visit  GENERAL: alert, oriented, no acute distress detected, full vision exam deferred due to pandemic and/or virtual encounter   PSYCH/NEURO: pleasant and cooperative, no obvious depression or anxiety, speech and thought processing grossly intact, Cognitive function grossly intact  Flowsheet Row Office Visit from 04/27/2023 in [facility] [place]  PHQ-9 Total Score 1        04/09/2024    5:25 PM 12/26/2023   10:18 AM 04/27/2023   11:24 AM 10/27/2022   11:33 AM 04/05/2022   10:51 AM  Depression screen PHQ 2/9  Decreased Interest 0 0 0 0 0  Down, Depressed, Hopeless 0 0 0 0 0  PHQ - 2 Score 0 0 0 0 0  Altered sleeping   1 0   Tired, decreased  energy   0 0   Change in appetite   0 0   Feeling bad or failure about yourself    0 0   Trouble concentrating   0 0   Moving slowly or fidgety/restless   0 0   Suicidal thoughts   0 0   PHQ-9 Score   1 0   Difficult doing work/chores   Not difficult at all Not difficult at all        06/10/2022    7:15 AM 06/24/2022   10:54 AM 10/27/2022   11:32 AM 04/27/2023   11:26 AM 04/09/2024    5:07 PM  Fall Risk  Falls in the past year?  0 0 0 0  Was there an injury with Fall?  0 0 0 0  Fall Risk Category Calculator  0 0 0 0  (RETIRED) Patient Fall Risk Level Moderate fall risk       Patient at Risk for Falls Due to  No Fall Risks No Fall Risks No Fall Risks No Fall Risks  Fall risk Follow up  Falls evaluation completed  Falls evaluation completed Falls evaluation completed Falls evaluation completed;Education provided     Data saved with a previous flowsheet row definition     SUMMARY AND PLAN:  Encounter for Medicare annual wellness exam  Discussed applicable health maintenance/preventive health measures and advised and referred or ordered per patient preferences: -reports just did bone density test with Dr Marget 03/06/24; discussed adequate Vit D, Ca and bone building exercise -declined vaccines -she plans to schedule inperson visit with Dr. Johnny and do labs then  Health Maintenance  Topic Date Due   Hepatitis C Screening  Never done   COVID-19 Vaccine (4 - 2025-26 season) 04/25/2024 (Originally 02/05/2024)   Zoster Vaccines- Shingrix (1 of 2) 07/10/2024 (Originally 06/12/1969)   Influenza Vaccine  09/03/2024 (Originally 01/05/2024)   DTaP/Tdap/Td (1 - Tdap) 04/09/2025 (Originally 06/12/1969)   Medicare Annual Wellness (AWV)  04/09/2025   Colonoscopy  06/27/2027   Pneumococcal Vaccine: 50+ Years  Completed   DEXA SCAN  Completed   Meningococcal B Vaccine  Aged Out   Mammogram  Discontinued      Education and counseling on the following was provided based on the above review of health  and a plan/checklist for the patient, along with additional information discussed, was provided for the patient in the patient instructions :  -Advised on importance of completing advanced directives, discussed options for completing and provided information in patient instructions as well -Provided counseling and plan for difficulty hearing - she reports no to hearing issues, but then says sometimes has issues in one ear, advised audiology evaluation and discussed options. She says she will go to [facility].  -No history of falls recently, however given age  safe balance exercises that can be done at home to improve balance and discussed exercise guidelines for adults with include balance exercises at least 3 days per week.  -Advised and counseled on a healthy lifestyle - including the importance of a healthy diet, regular physical activity, social connections and stress management. -Reviewed patient's current diet. Advised and counseled on a whole foods based healthy diet. A summary of a healthy diet was provided in the Patient Instructions.  -reviewed patient's current physical activity level and discussed exercise guidelines for adults. Discussed community resources and ideas for safe exercise at home to assist in meeting exercise guideline recommendations in a safe and healthy way.  -Advise yearly dental visits at minimum and regular eye exams  Follow up: see patient instructions     Patient Instructions  I really enjoyed getting to talk with you today! I am available on Tuesdays and Thursdays for virtual visits if you have any questions or concerns, or if I can be of any further assistance.   CHECKLIST FROM ANNUAL WELLNESS VISIT:  -Follow up (please call to schedule if not scheduled after visit):   -yearly for annual wellness visit with primary care office  Here is a list of your preventive care/health maintenance measures and the plan for each if any are due:  PLAN For any measures below  that may be due:    1. Please bring copy of bone density to Dr. Johnny   2. Can get vaccines at the pharmacy if you change you mind.    3. Can get hepatitis C screening when you come for you in office visit.   Health Maintenance  Topic Date Due   Hepatitis C Screening  Never done   COVID-19 Vaccine (4 - 2025-26 season) 04/25/2024 (Originally 02/05/2024)   Zoster Vaccines- Shingrix (1 of 2) 07/10/2024 (Originally 06/12/1969)   Influenza Vaccine  09/03/2024 (Originally 01/05/2024)   DTaP/Tdap/Td (1 - Tdap) 04/09/2025 (Originally 06/12/1969)   Medicare Annual Wellness (AWV)  04/09/2025   Colonoscopy  06/27/2027   Pneumococcal Vaccine: 50+ Years  Completed   DEXA SCAN  Completed   Meningococcal B Vaccine  Aged Out   Mammogram  Discontinued    -See a dentist at least yearly  -Get your eyes checked and then per your eye specialist's recommendations   -I have included below further information regarding a healthy whole foods based diet, physical activity guidelines for adults, stress management and opportunities for social connections. I hope you find this information useful.   -----------------------------------------------------------------------------------------------------------------------------------------------------------------------------------------------------------------------------------------------------------    NUTRITION: -eat real food: lots of colorful vegetables (half the plate) and fruits -5-7 servings of vegetables and fruits per day (fresh or steamed is best), exp. 2 servings of vegetables with lunch and dinner and 2 servings of fruit per day. Berries and greens such as kale and collards are great choices.  -consume on a regular basis:  fresh fruits, fresh veggies, fish, nuts, seeds, healthy oils (such as olive oil, avocado oil), whole grains (make sure  for bread/pasta/crackers/etc., that the first ingredient on label contains the word whole), legumes. -can eat small  amounts of dairy and lean meat (no larger than the palm of your hand), but avoid processed meats such as ham, bacon, lunch meat, etc. -drink water  -try to avoid fast food and pre-packaged foods, processed meat, ultra processed foods/beverages (donuts, candy, etc.) -most experts advise limiting sodium to < 2300mg  per day, should limit further is any chronic conditions such as high blood pressure, heart disease, diabetes, etc. The American Heart Association advised that < 1500mg  is is ideal -try to avoid foods/beverages that contain any ingredients with names you do not recognize  -try to avoid foods/beverages  with added sugar or sweeteners/sweets  -try to avoid sweet drinks (including diet drinks): soda, juice, Gatorade, sweet tea, power drinks, diet drinks -try to avoid white rice, white bread, pasta (unless whole grain)  EXERCISE GUIDELINES FOR ADULTS: -if you wish to increase your physical activity, do so gradually and with the approval of your doctor -STOP and seek medical care immediately if you have any chest pain, chest discomfort or trouble breathing when starting or increasing exercise  -move and stretch your body, legs, feet and arms when sitting for long periods -Physical activity guidelines for optimal health in adults: -get at least 150 minutes per week of moderate exercise (can talk, but not sing); this is about 20-30 minutes of sustained activity 5-7 days per week or two 10-15 minute episodes of sustained activity 5-7 days per week -do some muscle building/resistance training/strength training at least 2 days per week  -balance exercises 3+ days per week:   Stand somewhere where you have something sturdy to hold onto if you lose balance    1) lift up on toes, then back down, start with 5x per day and work up to 20x   2) stand and lift one leg straight out to the side so that foot is a few inches of the floor, start with 5x each side and work up to 20x each side   3) stand on one  foot, start with 5 seconds each side and work up to 20 seconds on each side  If you need ideas or help with getting more active:  -Silver sneakers https://tools.silversneakers.com  -Walk with a Doc: Http://www.duncan-williams.com/  -try to include resistance (weight lifting/strength building) and balance exercises twice per week: or the following link for ideas: http://castillo-powell.com/  buyducts.dk  STRESS MANAGEMENT: -can try meditating, or just sitting quietly with deep breathing while intentionally relaxing all parts of your body for 5 minutes daily -if you need further help with stress, anxiety or depression please follow up with your primary doctor or contact the wonderful folks at [facility]: [phone]  SOCIAL CONNECTIONS: -options in [place] if you wish to engage in more social and exercise related activities:  -Silver sneakers https://tools.silversneakers.com  -Walk with a Doc: Http://www.duncan-williams.com/  -Check out the [facility] Active Adults 50+ section on the [place] [employer] companies (hiking clubs, book clubs, cards and games, chess, exercise classes, aquatic classes and much more) - see the website for details: https://www.[place].gov/departments/parks-recreation/active-adults50  -YouTube has lots of exercise videos for different ages and abilities as well  -[facility] (a variety of indoor and outdoor inperson activities for adults). [phone]. [address].  -Virtual Online Classes (a variety of topics): see seniorplanet.org or call 5011615373  -consider volunteering at a school, hospice center, church, senior center or elsewhere  Chiquita JONELLE Cramp, DO

## 2024-05-21 ENCOUNTER — Ambulatory Visit: Admitting: Family Medicine

## 2024-05-21 ENCOUNTER — Ambulatory Visit: Payer: Self-pay | Admitting: Family Medicine

## 2024-05-21 ENCOUNTER — Encounter: Payer: Self-pay | Admitting: Family Medicine

## 2024-05-21 VITALS — BP 126/78 | HR 83 | Temp 98.4°F | Ht 64.5 in | Wt 151.0 lb

## 2024-05-21 DIAGNOSIS — M5440 Lumbago with sciatica, unspecified side: Secondary | ICD-10-CM

## 2024-05-21 DIAGNOSIS — M65312 Trigger thumb, left thumb: Secondary | ICD-10-CM

## 2024-05-21 DIAGNOSIS — R739 Hyperglycemia, unspecified: Secondary | ICD-10-CM

## 2024-05-21 DIAGNOSIS — E782 Mixed hyperlipidemia: Secondary | ICD-10-CM

## 2024-05-21 DIAGNOSIS — I10 Essential (primary) hypertension: Secondary | ICD-10-CM

## 2024-05-21 DIAGNOSIS — E559 Vitamin D deficiency, unspecified: Secondary | ICD-10-CM

## 2024-05-21 DIAGNOSIS — C7A012 Malignant carcinoid tumor of the ileum: Secondary | ICD-10-CM

## 2024-05-21 LAB — BASIC METABOLIC PANEL WITH GFR
BUN: 13 mg/dL (ref 6–23)
CO2: 31 meq/L (ref 19–32)
Calcium: 9.7 mg/dL (ref 8.4–10.5)
Chloride: 104 meq/L (ref 96–112)
Creatinine, Ser: 0.83 mg/dL (ref 0.40–1.20)
GFR: 68.71 mL/min (ref >60.00)
Glucose, Bld: 73 mg/dL (ref 70–99)
Potassium: 3.8 meq/L (ref 3.5–5.1)
Sodium: 141 meq/L (ref 135–145)

## 2024-05-21 LAB — VITAMIN D 25 HYDROXY (VIT D DEFICIENCY, FRACTURES): VITD: 15.55 ng/mL — ABNORMAL LOW (ref 30.00–100.00)

## 2024-05-21 LAB — CBC WITH DIFFERENTIAL/PLATELET
Basophils Absolute: 0 K/uL (ref 0.0–0.1)
Basophils Relative: 1.1 % (ref 0.0–3.0)
Eosinophils Absolute: 0.1 K/uL (ref 0.0–0.7)
Eosinophils Relative: 1.1 % (ref 0.0–5.0)
HCT: 40.3 % (ref 36.0–46.0)
Hemoglobin: 13.6 g/dL (ref 12.0–15.0)
Lymphocytes Relative: 25.8 % (ref 12.0–46.0)
Lymphs Abs: 1.2 K/uL (ref 0.7–4.0)
MCHC: 33.7 g/dL (ref 30.0–36.0)
MCV: 91.1 fl (ref 78.0–100.0)
Monocytes Absolute: 0.2 K/uL (ref 0.1–1.0)
Monocytes Relative: 5.3 % (ref 3.0–12.0)
Neutro Abs: 3 K/uL (ref 1.4–7.7)
Neutrophils Relative %: 66.7 % (ref 43.0–77.0)
Platelets: 243 K/uL (ref 150.0–400.0)
RBC: 4.43 Mil/uL (ref 3.87–5.11)
RDW: 13.5 % (ref 11.5–15.5)
WBC: 4.5 K/uL (ref 4.0–10.5)

## 2024-05-21 LAB — HEPATIC FUNCTION PANEL
ALT: 10 U/L (ref 3–35)
AST: 18 U/L (ref 5–37)
Albumin: 4.2 g/dL (ref 3.5–5.2)
Alkaline Phosphatase: 52 U/L (ref 39–117)
Bilirubin, Direct: 0.1 mg/dL (ref 0.1–0.3)
Total Bilirubin: 0.4 mg/dL (ref 0.2–1.2)
Total Protein: 7.9 g/dL (ref 6.0–8.3)

## 2024-05-21 LAB — LIPID PANEL
Cholesterol: 220 mg/dL — ABNORMAL HIGH (ref 28–200)
HDL: 63.1 mg/dL (ref >39.00)
LDL Cholesterol: 141 mg/dL — ABNORMAL HIGH (ref 10–99)
NonHDL: 156.7
Total CHOL/HDL Ratio: 3
Triglycerides: 80 mg/dL (ref 10.0–149.0)
VLDL: 16 mg/dL (ref 0.0–40.0)

## 2024-05-21 LAB — TSH: TSH: 0.55 u[IU]/mL (ref 0.35–5.50)

## 2024-05-21 LAB — HEMOGLOBIN A1C: Hgb A1c MFr Bld: 5.5 % (ref 4.6–6.5)

## 2024-05-21 MED ORDER — IBUPROFEN 800 MG PO TABS: 800.0000 mg | ORAL_TABLET | Freq: Three times a day (TID) | ORAL | 5 refills | Status: AC | PRN

## 2024-05-21 MED ORDER — VITAMIN D (ERGOCALCIFEROL) 1.25 MG (50000 UNIT) PO CAPS: 50000.0000 [IU] | ORAL_CAPSULE | ORAL | 3 refills | Status: AC

## 2024-05-21 NOTE — Addendum Note (Signed)
 Addended by: JOHNNY SENIOR A on: 05/21/2024 05:13 PM   Modules accepted: Orders

## 2024-05-21 NOTE — Progress Notes (Signed)
 Subjective:    Patient ID: Christy Kirby, female    DOB: 09-09-1950, 73 y.o.   MRN: [id number]  HPI Here to follow up on issues. She still has her low back pain, and the arthritis has spread to some other areas as well. She has a lot of pain in both thumbs, and they often lock in a flexed position so that she has to push them open. She recently had a bone density test with her GYN, Dr. Marget, and he said she has osteopenia. She sees Oncology regularly to monitor the carcinoid tumor. Her BP has been stable.    Review of Systems  Constitutional: Negative.   HENT: Negative.    Eyes: Negative.   Respiratory: Negative.    Cardiovascular: Negative.   Gastrointestinal: Negative.   Genitourinary:  Negative for decreased urine volume, difficulty urinating, dyspareunia, dysuria, enuresis, flank pain, frequency, hematuria, pelvic pain and urgency.  Musculoskeletal:  Positive for arthralgias and back pain.  Skin: Negative.   Neurological: Negative.  Negative for headaches.  Psychiatric/Behavioral: Negative.         Objective:   Physical Exam Constitutional:      General: She is not in acute distress.    Appearance: Normal appearance. She is well-developed.  HENT:     Head: Normocephalic and atraumatic.     Right Ear: External ear normal.     Left Ear: External ear normal.     Nose: Nose normal.     Mouth/Throat:     Pharynx: No oropharyngeal exudate.  Eyes:     General: No scleral icterus.    Conjunctiva/sclera: Conjunctivae normal.     Pupils: Pupils are equal, round, and reactive to light.  Neck:     Thyroid : No thyromegaly.     Vascular: No JVD.  Cardiovascular:     Rate and Rhythm: Normal rate and regular rhythm.     Pulses: Normal pulses.     Heart sounds: Normal heart sounds. No murmur heard.    No friction rub. No gallop.  Pulmonary:     Effort: Pulmonary effort is normal. No respiratory distress.     Breath sounds: Normal breath sounds. No wheezing or rales.  Chest:      Chest wall: No tenderness.  Abdominal:     General: Bowel sounds are normal. There is no distension.     Palpations: Abdomen is soft. There is no mass.     Tenderness: There is no abdominal tenderness. There is no guarding or rebound.  Musculoskeletal:        General: No tenderness. Normal range of motion.     Cervical back: Normal range of motion and neck supple.  Lymphadenopathy:     Cervical: No cervical adenopathy.  Skin:    General: Skin is warm and dry.     Findings: No erythema or rash.  Neurological:     General: No focal deficit present.     Mental Status: She is alert and oriented to person, place, and time.     Cranial Nerves: No cranial nerve deficit.     Motor: No abnormal muscle tone.     Coordination: Coordination normal.     Deep Tendon Reflexes: Reflexes are normal and symmetric. Reflexes normal.  Psychiatric:        Mood and Affect: Mood normal.        Behavior: Behavior normal.        Thought Content: Thought content normal.  Judgment: Judgment normal.           Assessment & Plan:  Her HTN is stable. Her low back pain is stable, and we refilled the Ibuprofen  for her ot use as needed. She has bilateral trigger thumbs, so we referred her to Hand Surgery. She will follow up with Oncology for the carcinoid tumor. We wll get labs to check lipids, a vitamin D  level, etc. I personally spent a total of 33 minutes in the care of the patient today including getting/reviewing separately obtained history, performing a medically appropriate exam/evaluation, placing orders, and referring and communicating with other health care professionals.  Garnette Olmsted, MD

## 2024-06-26 ENCOUNTER — Other Ambulatory Visit: Payer: Self-pay

## 2024-06-26 DIAGNOSIS — C7A012 Malignant carcinoid tumor of the ileum: Secondary | ICD-10-CM

## 2024-06-26 DIAGNOSIS — C179 Malignant neoplasm of small intestine, unspecified: Secondary | ICD-10-CM

## 2024-06-26 DIAGNOSIS — C7A098 Malignant carcinoid tumors of other sites: Secondary | ICD-10-CM

## 2024-06-26 NOTE — Assessment & Plan Note (Addendum)
 pT4N1M0 stage III -diagnosed in 10/2022 -Underwent ileal resection by Dr. Sheldon on 10/07/2022.  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 Ki-67 1%. Also mesenteric tumor deposit measuring 2.7 cm was resected.  Staged as a T4, N1.  -Dotatate scan showed single focus (8 mm) of radiotracer activity in the ventral peritoneal space of the mid right abdomen.  -Discussed with Dr. Sheldon who feels that surgery may put the small bowel blood supply at risk, so we decided to observe with annual scan and office visit with lab every 6 months - 12/19/2023 -CT abdomen pelvis.  Showing that previously noted mesenteric node not currently identified.  Was better evaluated on most recent PET/CT.  There was no new evidence of enlarging masses.  No evidence of recurrence or metastatic disease.  Plan for annual surveillance scan in July 2026. - 12/26/2023 -clinical exam is benign.  Chromogranin a is normal. - 06/27/2024 -patient is clinically doing well, asymptomatic chromogranin A continues to be normal.  Will order CT CAP for July 2026.  Plan for labs and follow-up 2 weeks after that.

## 2024-06-26 NOTE — Progress Notes (Signed)
 " Patient Care Team: Johnny Garnette LABOR, MD as PCP - Diedre Sheldon Standing, MD as Consulting Physician (General Surgery) Abran Norleen SAILOR, MD as Consulting Physician (Gastroenterology) Lanny Callander, MD as Consulting Physician (Oncology) Dr. 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 Sheldon on 10/07/2022.  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 Ki-67 1%. Also mesenteric tumor deposit measuring 2.7 cm was resected.  Staged as a T4, N1.  -Dotatate scan showed single focus (8 mm) of radiotracer activity in the ventral peritoneal space of the mid right abdomen.  -Discussed with Dr. Sheldon who feels that surgery may put the small bowel blood supply at risk, so we decided to observe with annual scan and office visit with lab every 6 months - 12/19/2023 -CT abdomen pelvis.  Showing that previously noted mesenteric node not currently identified.  Was better evaluated on most recent PET/CT.  There was no new evidence of enlarging masses.  No evidence of recurrence or metastatic disease.  Plan for annual surveillance scan in July 2026. - 12/26/2023 -clinical exam is benign.  Chromogranin a is normal. - 06/27/2024 -patient is clinically doing well, asymptomatic chromogranin A continues to be normal.  Will order CT CAP for July 2026.  Plan for labs and follow-up 2 weeks after that.   Plan Labs reviewed. -Unremarkable CBC and CMP. -Normal chromogranin A at 43.6. Patient due for annual CT CAP in July 2026.  This was ordered as part of today's visit. Plan for labs and follow-up in 6 months, sooner if needed.  The patient understands the plans discussed today and is in agreement with them.  She knows to contact our office if she  develops concerns prior to her next appointment.  I provided 20 minutes of face-to-face time during this encounter and > 50% was spent counseling as documented under my assessment and plan.    Powell FORBES Lessen, NP  [facility] - A DEPT OF [facility] [address] [place] [address] Dept: [phone] Dept Fax: [phone]   Orders Placed This Encounter  Procedures   CT ABDOMEN PELVIS W CONTRAST    Standing Status:   Future    Expected Date:   12/25/2024    Expiration Date:   06/27/2025    If indicated for the ordered procedure, I authorize the administration of contrast media per Radiology protocol:   Yes    Does the patient have a contrast media/X-ray dye allergy?:   No    Preferred imaging location?:   [facility]    If indicated for the ordered procedure, I authorize the administration of oral contrast media per Radiology protocol:   Yes      CHIEF COMPLAINT:  CC: Malignant carcinoid tumor Plan  Current Treatment: Observation  INTERVAL HISTORY:  Christy Kirby is here today for repeat clinical assessment.  She was last seen by me on 12/26/2023.  The patient reports feeling asymptomatic.  She denies unusual bleeding such as blood in her stool, hematemesis, hemoptysis, or epistaxis.  She denies flushing.  She denies chest pain, chest pressure, or shortness of breath. She denies headaches or visual disturbances. She denies abdominal pain, nausea, vomiting, or changes in bowel or bladder  habits.  She denies fevers or chills. She denies pain. Her appetite is good. Her weight has been stable.  I have reviewed the past medical history, past surgical history, social history and family history with the patient and they are unchanged from previous note.  ALLERGIES:  is allergic to codeine.  MEDICATIONS:  Current Outpatient Medications  Medication Sig Dispense Refill   ibuprofen  (ADVIL ) 800 MG tablet Take 1 tablet  (800 mg total) by mouth every 8 (eight) hours as needed for moderate pain (pain score 4-6). 90 tablet 5   metoprolol  tartrate (LOPRESSOR ) 25 MG tablet TAKE 1/2 TABLET TWICE A DAY BY MOUTH 90 tablet 3   Vitamin D , Ergocalciferol , (DRISDOL ) 1.25 MG (50000 UNIT) CAPS capsule Take 1 capsule (50,000 Units total) by mouth every 7 (seven) days. 12 capsule 3   acetaminophen  (TYLENOL ) 325 MG tablet Take 2 tablets (650 mg total) by mouth every 6 (six) hours as needed for mild pain or headache. (Patient not taking: Reported on 06/27/2024)     No current facility-administered medications for this visit.    HISTORY OF PRESENT ILLNESS:   Oncology History  Malignant carcinoid tumor of ileum (HCC)  06/07/2022 Imaging   CT ABDOMEN PELVIS W CONTRAST   IMPRESSION: 1. Stranding in the ileal mesentery surrounding a low-density area in hindsight as seen on the prior study. This low-density area measures 19 x 17 mm. There is suggestion of some peripheral and nodular enhancement on coronal images. Findings are nonspecific and are of uncertain significance. Differential diagnosis would include Meckel's diverticulitis or cystic/necrotic lymph node or nodal metastasis in the ileal mesentery. Given nodular enhancement at the periphery underlying neoplasm would be difficult to exclude. Interloop abscess is felt less likely but given stranding not entirely excluded. Would also correlate with any signs of history of GI bleeding. Surgical consultation may be helpful. 2. No signs of pneumatosis or other secondary signs of bowel compromise. 3. Cholelithiasis without evidence of acute cholecystitis. 4. Trace free fluid in the pelvis, of uncertain significance. 5. Uterus with multiple leiomyomata, not well assessed on CT due to streak artifact from RIGHT hip arthroplasty. 6. Aortic atherosclerosis.   Aortic Atherosclerosis (ICD10-I70.0).     Electronically Signed   By: Isla Blind M.D.   On: 06/07/2022 11:47    07/14/2022 Imaging   CT ENTERO ABD/PELVIS W CONTAST   IMPRESSION: 11 mm distal small bowel lesion, suspicious for small bowel carcinoid.   Associated ileal mesenteric nodes correspond to suspected mesenteric carcinoid.   Cholelithiasis, without associated inflammatory changes.   10/07/2022 Procedure   ROBOTIC ILEAL RESECTION by Dr. Sheldon    10/07/2022 Initial Diagnosis   Well-differentiated neuroendocrine tumor (G1; low-grade) of the ileum    10/07/2022 Pathology Results   SURGICAL PATHOLOGY  CASE: [id number]   B. ILEUM MASS, RESECTION:  - Well-differentiated neuroendocrine tumor (G1; low-grade), 1.5 cm,  involving ileum  - Tumor involves the serosal surface  - Resection margins are negative for tumor  - Metastatic tumor to two of three lymph nodes (2/3)  - Mesenteric tumor deposit, 2.7 cm  - See oncology table    10/07/2022 Cancer Staging   Staging form: Neuroendocrine Tumor - Jejunum/Ileum, AJCC V9 - Pathologic stage from 10/07/2022: Stage III (pT4, pN1, cM0) - Signed by Lanny Callander, MD on 06/25/2023 Histologic grade (G): G1 Histologic grading system: 3 grade system Residual tumor (R): R0 - None   11/04/2022 Initial Diagnosis   Carcinoid tumor of ileum       REVIEW  OF SYSTEMS:   Constitutional: Denies fevers, chills or abnormal weight loss Eyes: Denies blurriness of vision Ears, nose, mouth, throat, and face: Denies mucositis or sore throat Respiratory: Denies cough, dyspnea or wheezes Cardiovascular: Denies palpitation, chest discomfort or lower extremity swelling Gastrointestinal:  Denies nausea, heartburn or change in bowel habits Skin: Denies abnormal skin rashes Lymphatics: Denies new lymphadenopathy or easy bruising Neurological:Denies numbness, tingling or new weaknesses Behavioral/Psych: Mood is stable, no new changes  All other systems were reviewed with the patient and are negative.   VITALS:   Today's Vitals   06/27/24 1200 06/27/24 1247  BP: (!)  146/89 (!) 152/88  Pulse: 78   Resp: 16   Temp: 97.7 F (36.5 C)   TempSrc: Temporal   SpO2: 99%   Weight: 151 lb 12.8 oz (68.9 kg)   PainSc: 6     Body mass index is 25.65 kg/m.   Wt Readings from Last 3 Encounters:  06/27/24 151 lb 12.8 oz (68.9 kg)  05/21/24 151 lb (68.5 kg)  12/26/23 151 lb 8 oz (68.7 kg)    Body mass index is 25.65 kg/m.  Performance status (ECOG): 0 - Asymptomatic  PHYSICAL EXAM:   GENERAL:alert, no distress and comfortable SKIN: skin color, texture, turgor are normal, no rashes or significant lesions EYES: normal, Conjunctiva are pink and non-injected, sclera clear OROPHARYNX:no exudate, no erythema and lips, buccal mucosa, and tongue normal  NECK: supple, thyroid  normal size, non-tender, without nodularity LYMPH:  no palpable lymphadenopathy in the cervical, axillary or inguinal LUNGS: clear to auscultation and percussion with normal breathing effort HEART: regular rate & rhythm and no murmurs and no lower extremity edema ABDOMEN:abdomen soft, non-tender and normal bowel sounds Musculoskeletal:no cyanosis of digits and no clubbing  NEURO: alert & oriented x 3 with fluent speech, no focal motor/sensory deficits  LABORATORY DATA:  I have reviewed the data as listed    Component Value Date/Time   NA 140 06/27/2024 1207   K 4.1 06/27/2024 1207   CL 104 06/27/2024 1207   CO2 26 06/27/2024 1207   GLUCOSE 92 06/27/2024 1207   BUN 14 06/27/2024 1207   CREATININE 0.94 06/27/2024 1207   CALCIUM  9.6 06/27/2024 1207   PROT 8.1 06/27/2024 1207   ALBUMIN 4.2 06/27/2024 1207   AST 20 06/27/2024 1207   ALT 6 06/27/2024 1207   ALKPHOS 58 06/27/2024 1207   BILITOT 0.5 06/27/2024 1207   GFRNONAA >60 06/27/2024 1207   GFRAA >60 01/12/2018 0516   Lab Results  Component Value Date   WBC 4.0 06/27/2024   NEUTROABS 2.5 06/27/2024   HGB 13.6 06/27/2024   HCT 40.5 06/27/2024   MCV 89.4 06/27/2024   PLT 252 06/27/2024    "

## 2024-06-27 ENCOUNTER — Inpatient Hospital Stay: Attending: Hematology

## 2024-06-27 ENCOUNTER — Inpatient Hospital Stay: Admitting: Nurse Practitioner

## 2024-06-27 VITALS — BP 152/88 | HR 78 | Temp 97.7°F | Resp 16 | Wt 151.8 lb

## 2024-06-27 DIAGNOSIS — C7A012 Malignant carcinoid tumor of the ileum: Secondary | ICD-10-CM

## 2024-06-27 DIAGNOSIS — Z85060 Personal history of malignant carcinoid tumor of small intestine: Secondary | ICD-10-CM | POA: Insufficient documentation

## 2024-06-27 DIAGNOSIS — C179 Malignant neoplasm of small intestine, unspecified: Secondary | ICD-10-CM

## 2024-06-27 DIAGNOSIS — C7A098 Malignant carcinoid tumors of other sites: Secondary | ICD-10-CM

## 2024-06-27 LAB — CBC WITH DIFFERENTIAL (CANCER CENTER ONLY)
Abs Immature Granulocytes: 0.01 K/uL (ref 0.00–0.07)
Basophils Absolute: 0 K/uL (ref 0.0–0.1)
Basophils Relative: 1 %
Eosinophils Absolute: 0.1 K/uL (ref 0.0–0.5)
Eosinophils Relative: 2 %
HCT: 40.5 % (ref 36.0–46.0)
Hemoglobin: 13.6 g/dL (ref 12.0–15.0)
Immature Granulocytes: 0 %
Lymphocytes Relative: 30 %
Lymphs Abs: 1.2 K/uL (ref 0.7–4.0)
MCH: 30 pg (ref 26.0–34.0)
MCHC: 33.6 g/dL (ref 30.0–36.0)
MCV: 89.4 fL (ref 80.0–100.0)
Monocytes Absolute: 0.2 K/uL (ref 0.1–1.0)
Monocytes Relative: 5 %
Neutro Abs: 2.5 K/uL (ref 1.7–7.7)
Neutrophils Relative %: 62 %
Platelet Count: 252 K/uL (ref 150–400)
RBC: 4.53 MIL/uL (ref 3.87–5.11)
RDW: 12.7 % (ref 11.5–15.5)
WBC Count: 4 K/uL (ref 4.0–10.5)
nRBC: 0 % (ref 0.0–0.2)

## 2024-06-27 LAB — CMP (CANCER CENTER ONLY)
ALT: 6 U/L (ref 0–44)
AST: 20 U/L (ref 15–41)
Albumin: 4.2 g/dL (ref 3.5–5.0)
Alkaline Phosphatase: 58 U/L (ref 38–126)
Anion gap: 11 (ref 5–15)
BUN: 14 mg/dL (ref 8–23)
CO2: 26 mmol/L (ref 22–32)
Calcium: 9.6 mg/dL (ref 8.9–10.3)
Chloride: 104 mmol/L (ref 98–111)
Creatinine: 0.94 mg/dL (ref 0.44–1.00)
GFR, Estimated: >60 mL/min
Glucose, Bld: 92 mg/dL (ref 70–99)
Potassium: 4.1 mmol/L (ref 3.5–5.1)
Sodium: 140 mmol/L (ref 135–145)
Total Bilirubin: 0.5 mg/dL (ref 0.0–1.2)
Total Protein: 8.1 g/dL (ref 6.5–8.1)

## 2024-06-28 LAB — CHROMOGRANIN A: Chromogranin A (ng/mL): 43.6 ng/mL (ref 0.0–101.8)

## 2024-06-30 ENCOUNTER — Encounter: Payer: Self-pay | Admitting: Nurse Practitioner

## 2024-12-24 ENCOUNTER — Inpatient Hospital Stay

## 2024-12-24 ENCOUNTER — Inpatient Hospital Stay: Admitting: Nurse Practitioner
